# Patient Record
Sex: Female | Born: 1975 | Race: White | Hispanic: No | Marital: Single | Smoking: Current every day smoker
Health system: Southern US, Community
[De-identification: ages and names within clinical notes are randomized; demographics above are authoritative.]

## PROBLEM LIST (undated history)

## (undated) DIAGNOSIS — R4589 Other symptoms and signs involving emotional state: Secondary | ICD-10-CM

## (undated) DIAGNOSIS — K900 Celiac disease: Secondary | ICD-10-CM

## (undated) DIAGNOSIS — F419 Anxiety disorder, unspecified: Secondary | ICD-10-CM

## (undated) DIAGNOSIS — F4310 Post-traumatic stress disorder, unspecified: Secondary | ICD-10-CM

## (undated) DIAGNOSIS — F410 Panic disorder [episodic paroxysmal anxiety] without agoraphobia: Secondary | ICD-10-CM

## (undated) DIAGNOSIS — N3010 Interstitial cystitis (chronic) without hematuria: Secondary | ICD-10-CM

## (undated) DIAGNOSIS — IMO0002 Reserved for concepts with insufficient information to code with codable children: Secondary | ICD-10-CM

## (undated) DIAGNOSIS — I471 Supraventricular tachycardia, unspecified: Secondary | ICD-10-CM

## (undated) DIAGNOSIS — N200 Calculus of kidney: Secondary | ICD-10-CM

## (undated) DIAGNOSIS — G8929 Other chronic pain: Secondary | ICD-10-CM

## (undated) DIAGNOSIS — F32A Depression, unspecified: Secondary | ICD-10-CM

## (undated) DIAGNOSIS — M51369 Other intervertebral disc degeneration, lumbar region without mention of lumbar back pain or lower extremity pain: Secondary | ICD-10-CM

## (undated) DIAGNOSIS — I499 Cardiac arrhythmia, unspecified: Secondary | ICD-10-CM

## (undated) DIAGNOSIS — M5136 Other intervertebral disc degeneration, lumbar region: Secondary | ICD-10-CM

## (undated) HISTORY — PX: APPENDECTOMY: SHX54

## (undated) HISTORY — PX: HERNIA REPAIR: SHX51

## (undated) HISTORY — PX: ADENOIDECTOMY: SUR15

## (undated) HISTORY — PX: TONSILLECTOMY: SUR1361

## (undated) HISTORY — PX: ABDOMINAL HYSTERECTOMY: SHX81

## (undated) HISTORY — PX: KIDNEY STONE SURGERY: SHX686

---

## 2000-06-16 ENCOUNTER — Other Ambulatory Visit: Admission: RE | Admit: 2000-06-16 | Discharge: 2000-06-16 | Payer: Self-pay | Admitting: Otolaryngology

## 2000-11-24 ENCOUNTER — Other Ambulatory Visit: Admission: RE | Admit: 2000-11-24 | Discharge: 2000-11-24 | Payer: Self-pay | Admitting: Obstetrics and Gynecology

## 2001-04-30 ENCOUNTER — Ambulatory Visit (HOSPITAL_COMMUNITY): Admission: AD | Admit: 2001-04-30 | Discharge: 2001-04-30 | Payer: Self-pay | Admitting: Obstetrics and Gynecology

## 2001-06-21 ENCOUNTER — Inpatient Hospital Stay (HOSPITAL_COMMUNITY): Admission: RE | Admit: 2001-06-21 | Discharge: 2001-06-25 | Payer: Self-pay | Admitting: Obstetrics and Gynecology

## 2001-07-12 ENCOUNTER — Ambulatory Visit (HOSPITAL_COMMUNITY): Admission: RE | Admit: 2001-07-12 | Discharge: 2001-07-12 | Payer: Self-pay | Admitting: General Surgery

## 2002-02-28 ENCOUNTER — Emergency Department (HOSPITAL_COMMUNITY): Admission: EM | Admit: 2002-02-28 | Discharge: 2002-02-28 | Payer: Self-pay | Admitting: Emergency Medicine

## 2002-02-28 ENCOUNTER — Encounter: Payer: Self-pay | Admitting: Emergency Medicine

## 2002-04-08 ENCOUNTER — Emergency Department (HOSPITAL_COMMUNITY): Admission: EM | Admit: 2002-04-08 | Discharge: 2002-04-08 | Payer: Self-pay | Admitting: Emergency Medicine

## 2002-04-08 ENCOUNTER — Encounter: Payer: Self-pay | Admitting: Emergency Medicine

## 2002-10-11 ENCOUNTER — Encounter: Payer: Self-pay | Admitting: Emergency Medicine

## 2002-10-11 ENCOUNTER — Emergency Department (HOSPITAL_COMMUNITY): Admission: EM | Admit: 2002-10-11 | Discharge: 2002-10-11 | Payer: Self-pay | Admitting: Emergency Medicine

## 2002-10-13 ENCOUNTER — Ambulatory Visit (HOSPITAL_COMMUNITY): Admission: RE | Admit: 2002-10-13 | Discharge: 2002-10-13 | Payer: Self-pay | Admitting: Emergency Medicine

## 2002-10-13 ENCOUNTER — Encounter: Payer: Self-pay | Admitting: Emergency Medicine

## 2002-11-21 ENCOUNTER — Emergency Department (HOSPITAL_COMMUNITY): Admission: EM | Admit: 2002-11-21 | Discharge: 2002-11-21 | Payer: Self-pay | Admitting: Emergency Medicine

## 2002-12-08 ENCOUNTER — Encounter (HOSPITAL_COMMUNITY): Admission: RE | Admit: 2002-12-08 | Discharge: 2003-01-07 | Payer: Self-pay | Admitting: Preventative Medicine

## 2003-02-21 ENCOUNTER — Encounter: Payer: Self-pay | Admitting: *Deleted

## 2003-02-21 ENCOUNTER — Emergency Department (HOSPITAL_COMMUNITY): Admission: EM | Admit: 2003-02-21 | Discharge: 2003-02-21 | Payer: Self-pay | Admitting: *Deleted

## 2003-07-14 ENCOUNTER — Emergency Department (HOSPITAL_COMMUNITY): Admission: EM | Admit: 2003-07-14 | Discharge: 2003-07-14 | Payer: Self-pay | Admitting: Emergency Medicine

## 2003-08-25 ENCOUNTER — Ambulatory Visit (HOSPITAL_COMMUNITY): Admission: RE | Admit: 2003-08-25 | Discharge: 2003-08-25 | Payer: Self-pay | Admitting: Obstetrics and Gynecology

## 2003-12-03 ENCOUNTER — Emergency Department (HOSPITAL_COMMUNITY): Admission: EM | Admit: 2003-12-03 | Discharge: 2003-12-03 | Payer: Self-pay | Admitting: Emergency Medicine

## 2004-02-10 ENCOUNTER — Emergency Department (HOSPITAL_COMMUNITY): Admission: EM | Admit: 2004-02-10 | Discharge: 2004-02-10 | Payer: Self-pay | Admitting: Emergency Medicine

## 2004-04-04 ENCOUNTER — Ambulatory Visit (HOSPITAL_COMMUNITY): Admission: RE | Admit: 2004-04-04 | Discharge: 2004-04-04 | Payer: Self-pay | Admitting: Obstetrics & Gynecology

## 2004-08-08 ENCOUNTER — Inpatient Hospital Stay (HOSPITAL_COMMUNITY): Admission: RE | Admit: 2004-08-08 | Discharge: 2004-08-10 | Payer: Self-pay | Admitting: Obstetrics & Gynecology

## 2004-08-17 ENCOUNTER — Inpatient Hospital Stay (HOSPITAL_COMMUNITY): Admission: AD | Admit: 2004-08-17 | Discharge: 2004-08-20 | Payer: Self-pay | Admitting: Obstetrics & Gynecology

## 2004-08-27 ENCOUNTER — Ambulatory Visit (HOSPITAL_COMMUNITY): Admission: RE | Admit: 2004-08-27 | Discharge: 2004-08-27 | Payer: Self-pay | Admitting: Obstetrics & Gynecology

## 2004-08-30 ENCOUNTER — Ambulatory Visit (HOSPITAL_COMMUNITY): Admission: RE | Admit: 2004-08-30 | Discharge: 2004-08-30 | Payer: Self-pay | Admitting: Obstetrics & Gynecology

## 2005-03-21 ENCOUNTER — Ambulatory Visit (HOSPITAL_COMMUNITY): Admission: RE | Admit: 2005-03-21 | Discharge: 2005-03-21 | Payer: Self-pay | Admitting: General Surgery

## 2005-08-23 ENCOUNTER — Emergency Department (HOSPITAL_COMMUNITY): Admission: EM | Admit: 2005-08-23 | Discharge: 2005-08-23 | Payer: Self-pay | Admitting: Emergency Medicine

## 2005-09-01 ENCOUNTER — Observation Stay (HOSPITAL_COMMUNITY): Admission: RE | Admit: 2005-09-01 | Discharge: 2005-09-02 | Payer: Self-pay | Admitting: General Surgery

## 2005-09-30 ENCOUNTER — Emergency Department (HOSPITAL_COMMUNITY): Admission: EM | Admit: 2005-09-30 | Discharge: 2005-09-30 | Payer: Self-pay | Admitting: Emergency Medicine

## 2005-10-06 ENCOUNTER — Emergency Department (HOSPITAL_COMMUNITY): Admission: EM | Admit: 2005-10-06 | Discharge: 2005-10-06 | Payer: Self-pay | Admitting: Emergency Medicine

## 2006-07-23 ENCOUNTER — Ambulatory Visit (HOSPITAL_COMMUNITY): Admission: RE | Admit: 2006-07-23 | Discharge: 2006-07-23 | Payer: Self-pay | Admitting: Pulmonary Disease

## 2006-11-22 ENCOUNTER — Emergency Department (HOSPITAL_COMMUNITY): Admission: EM | Admit: 2006-11-22 | Discharge: 2006-11-22 | Payer: Self-pay | Admitting: Emergency Medicine

## 2007-02-03 ENCOUNTER — Other Ambulatory Visit: Admission: RE | Admit: 2007-02-03 | Discharge: 2007-02-03 | Payer: Self-pay | Admitting: Obstetrics and Gynecology

## 2007-04-14 ENCOUNTER — Emergency Department (HOSPITAL_COMMUNITY): Admission: EM | Admit: 2007-04-14 | Discharge: 2007-04-14 | Payer: Self-pay | Admitting: Emergency Medicine

## 2007-04-15 ENCOUNTER — Emergency Department (HOSPITAL_COMMUNITY): Admission: EM | Admit: 2007-04-15 | Discharge: 2007-04-16 | Payer: Self-pay | Admitting: Emergency Medicine

## 2007-04-19 ENCOUNTER — Inpatient Hospital Stay (HOSPITAL_COMMUNITY): Admission: EM | Admit: 2007-04-19 | Discharge: 2007-04-29 | Payer: Self-pay | Admitting: Emergency Medicine

## 2007-04-20 ENCOUNTER — Ambulatory Visit: Payer: Self-pay | Admitting: Gastroenterology

## 2007-04-21 ENCOUNTER — Ambulatory Visit: Payer: Self-pay | Admitting: Gastroenterology

## 2007-04-21 ENCOUNTER — Encounter: Payer: Self-pay | Admitting: Gastroenterology

## 2007-04-22 ENCOUNTER — Ambulatory Visit: Payer: Self-pay | Admitting: Gastroenterology

## 2007-04-23 ENCOUNTER — Ambulatory Visit: Payer: Self-pay | Admitting: Oncology

## 2007-04-23 ENCOUNTER — Ambulatory Visit: Payer: Self-pay | Admitting: Cardiology

## 2007-04-23 ENCOUNTER — Encounter: Payer: Self-pay | Admitting: Cardiology

## 2007-04-24 ENCOUNTER — Encounter: Payer: Self-pay | Admitting: Internal Medicine

## 2007-04-27 ENCOUNTER — Ambulatory Visit: Payer: Self-pay | Admitting: Internal Medicine

## 2007-04-29 ENCOUNTER — Ambulatory Visit: Payer: Self-pay | Admitting: Internal Medicine

## 2007-05-01 ENCOUNTER — Emergency Department (HOSPITAL_COMMUNITY): Admission: EM | Admit: 2007-05-01 | Discharge: 2007-05-02 | Payer: Self-pay | Admitting: Emergency Medicine

## 2007-05-05 ENCOUNTER — Inpatient Hospital Stay (HOSPITAL_COMMUNITY): Admission: EM | Admit: 2007-05-05 | Discharge: 2007-05-15 | Payer: Self-pay | Admitting: Emergency Medicine

## 2007-05-06 ENCOUNTER — Ambulatory Visit: Payer: Self-pay | Admitting: Internal Medicine

## 2007-05-08 ENCOUNTER — Ambulatory Visit: Payer: Self-pay | Admitting: Gastroenterology

## 2007-05-11 ENCOUNTER — Ambulatory Visit: Payer: Self-pay | Admitting: Internal Medicine

## 2007-05-31 ENCOUNTER — Ambulatory Visit: Payer: Self-pay | Admitting: Internal Medicine

## 2007-06-13 ENCOUNTER — Emergency Department (HOSPITAL_COMMUNITY): Admission: EM | Admit: 2007-06-13 | Discharge: 2007-06-14 | Payer: Self-pay | Admitting: Emergency Medicine

## 2007-07-20 ENCOUNTER — Ambulatory Visit: Payer: Self-pay | Admitting: Gastroenterology

## 2008-03-21 ENCOUNTER — Inpatient Hospital Stay (HOSPITAL_COMMUNITY): Admission: RE | Admit: 2008-03-21 | Discharge: 2008-03-23 | Payer: Self-pay | Admitting: Obstetrics and Gynecology

## 2008-03-21 ENCOUNTER — Encounter: Payer: Self-pay | Admitting: Obstetrics and Gynecology

## 2008-06-22 ENCOUNTER — Ambulatory Visit: Payer: Self-pay | Admitting: Internal Medicine

## 2008-06-29 ENCOUNTER — Encounter: Payer: Self-pay | Admitting: Internal Medicine

## 2008-06-29 ENCOUNTER — Ambulatory Visit: Admission: RE | Admit: 2008-06-29 | Discharge: 2008-06-29 | Payer: Self-pay | Admitting: Internal Medicine

## 2008-07-06 ENCOUNTER — Ambulatory Visit: Payer: Self-pay | Admitting: Pulmonary Disease

## 2008-10-26 ENCOUNTER — Emergency Department (HOSPITAL_COMMUNITY): Admission: EM | Admit: 2008-10-26 | Discharge: 2008-10-26 | Payer: Self-pay | Admitting: Emergency Medicine

## 2008-10-29 ENCOUNTER — Emergency Department (HOSPITAL_COMMUNITY): Admission: EM | Admit: 2008-10-29 | Discharge: 2008-10-29 | Payer: Self-pay | Admitting: Emergency Medicine

## 2008-10-30 ENCOUNTER — Encounter (INDEPENDENT_AMBULATORY_CARE_PROVIDER_SITE_OTHER): Payer: Self-pay | Admitting: *Deleted

## 2008-11-04 ENCOUNTER — Emergency Department (HOSPITAL_COMMUNITY): Admission: EM | Admit: 2008-11-04 | Discharge: 2008-11-05 | Payer: Self-pay | Admitting: Emergency Medicine

## 2008-11-07 ENCOUNTER — Emergency Department (HOSPITAL_COMMUNITY): Admission: EM | Admit: 2008-11-07 | Discharge: 2008-11-07 | Payer: Self-pay | Admitting: Emergency Medicine

## 2008-12-05 DIAGNOSIS — K900 Celiac disease: Secondary | ICD-10-CM

## 2008-12-05 DIAGNOSIS — R109 Unspecified abdominal pain: Secondary | ICD-10-CM | POA: Insufficient documentation

## 2008-12-05 DIAGNOSIS — F411 Generalized anxiety disorder: Secondary | ICD-10-CM | POA: Insufficient documentation

## 2008-12-05 DIAGNOSIS — N83209 Unspecified ovarian cyst, unspecified side: Secondary | ICD-10-CM

## 2008-12-05 DIAGNOSIS — M545 Low back pain: Secondary | ICD-10-CM

## 2008-12-05 DIAGNOSIS — Z8679 Personal history of other diseases of the circulatory system: Secondary | ICD-10-CM

## 2009-01-12 ENCOUNTER — Ambulatory Visit (HOSPITAL_COMMUNITY): Admission: RE | Admit: 2009-01-12 | Discharge: 2009-01-12 | Payer: Self-pay | Admitting: Pulmonary Disease

## 2009-02-09 IMAGING — CR DG ABDOMEN 2V
3 series · 3 of 3 positions shown · non-contrast
Comparison: 04/20/2007

CLINICAL DATA: No bowel movement for 10 days.

ABDOMEN SERIES - 2 VIEW

[w abdomen upright (1 of 2)]
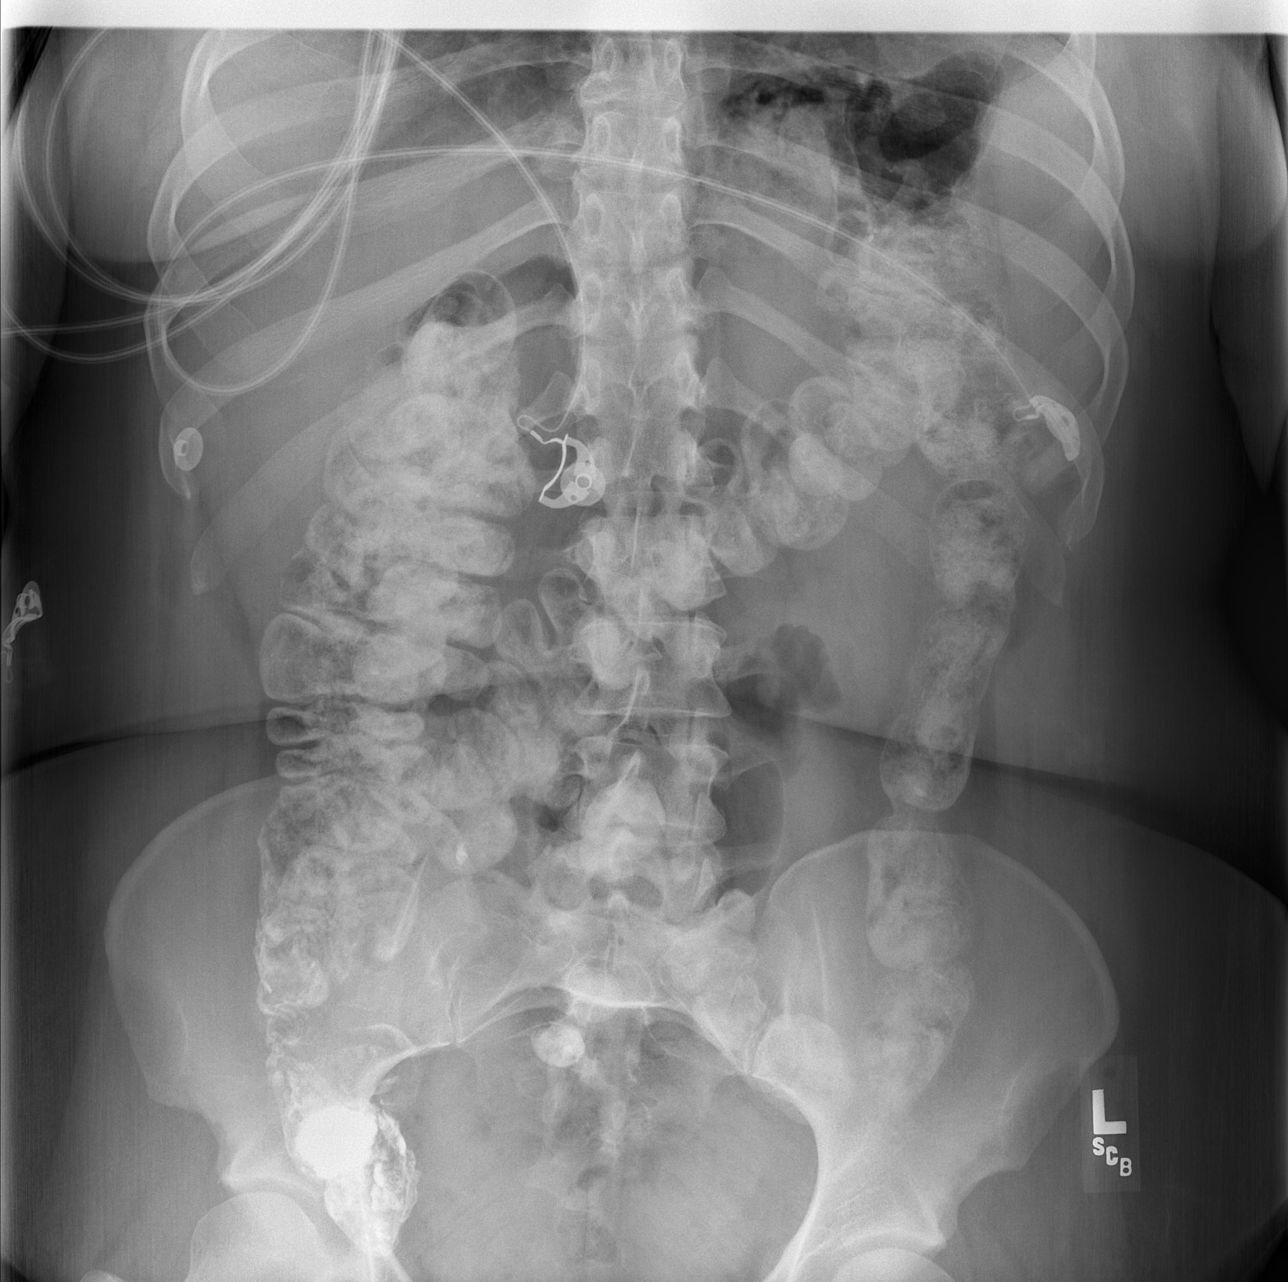

[w abdomen upright (2 of 2)]
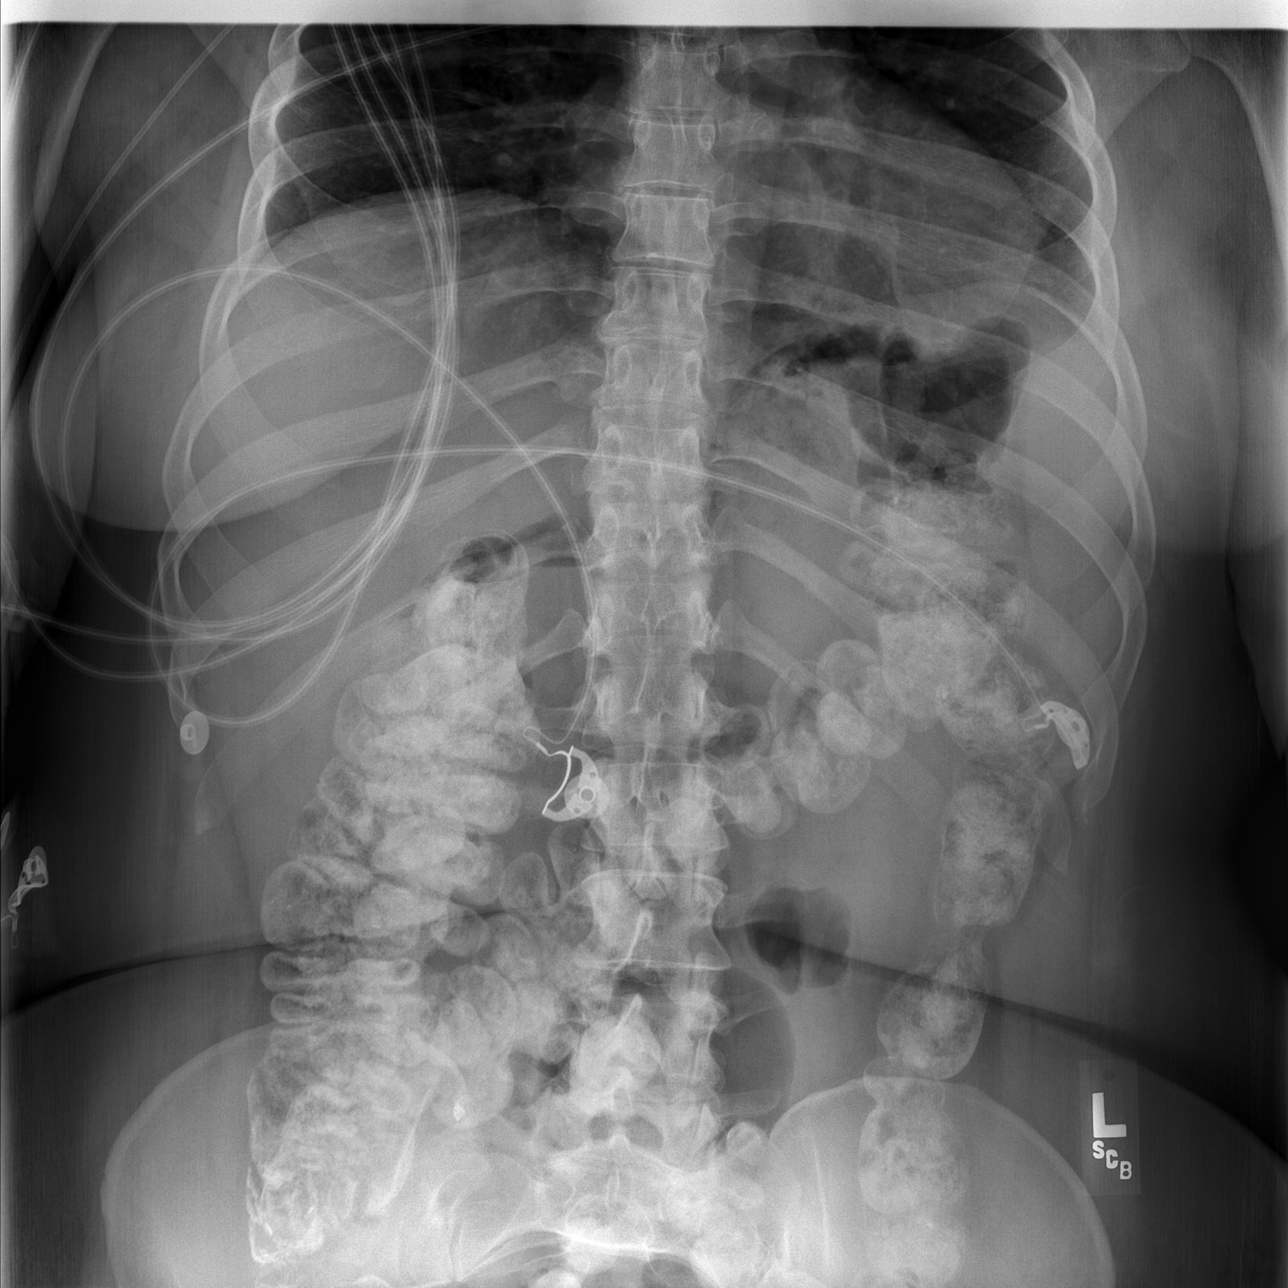

[t abdomen supine]
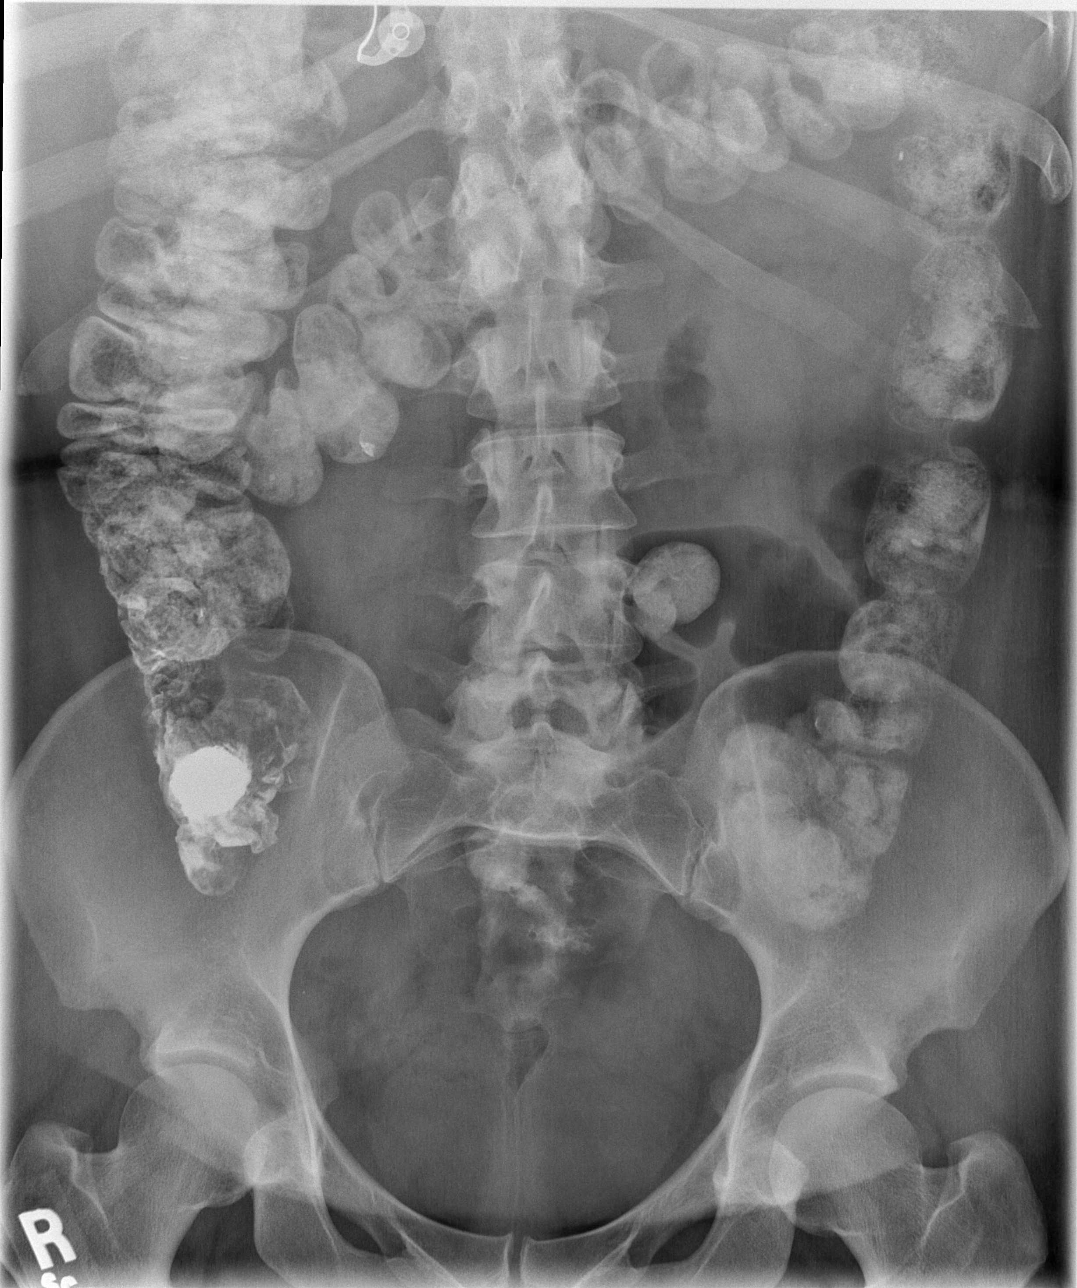

[3 of 3 positions shown; findings below may reference images not displayed]

FINDINGS: Partially flocculated barium is present in the colon. No dilated
bowel is evident. The colon demonstrates normal haustral pattern. No free
intraperitoneal gas is evident beneath the hemidiaphragms. No significant
abnormal air-fluid levels noted. The stomach does not appear distended. A
localized dense barium collection is noted in the cecum.

IMPRESSION

1. The pattern of barium in the colon is unremarkable. We do not demonstrate
prominence of stool in the colon, nor do we demonstrate dilated small bowel or
significant abnormal air-fluid levels.

## 2009-03-27 ENCOUNTER — Encounter: Payer: Self-pay | Admitting: Obstetrics and Gynecology

## 2009-03-27 ENCOUNTER — Ambulatory Visit (HOSPITAL_COMMUNITY): Admission: RE | Admit: 2009-03-27 | Discharge: 2009-03-27 | Payer: Self-pay | Admitting: Obstetrics and Gynecology

## 2009-07-09 ENCOUNTER — Encounter (INDEPENDENT_AMBULATORY_CARE_PROVIDER_SITE_OTHER): Payer: Self-pay | Admitting: *Deleted

## 2010-01-20 ENCOUNTER — Emergency Department (HOSPITAL_COMMUNITY): Admission: EM | Admit: 2010-01-20 | Discharge: 2010-01-20 | Payer: Self-pay | Admitting: Emergency Medicine

## 2010-04-27 ENCOUNTER — Emergency Department (HOSPITAL_COMMUNITY)
Admission: EM | Admit: 2010-04-27 | Discharge: 2010-04-27 | Payer: Self-pay | Source: Home / Self Care | Admitting: Emergency Medicine

## 2010-04-30 ENCOUNTER — Emergency Department (HOSPITAL_COMMUNITY)
Admission: EM | Admit: 2010-04-30 | Discharge: 2010-05-01 | Payer: Self-pay | Source: Home / Self Care | Admitting: Emergency Medicine

## 2010-05-07 ENCOUNTER — Inpatient Hospital Stay (HOSPITAL_COMMUNITY)
Admission: AD | Admit: 2010-05-07 | Discharge: 2010-05-24 | Payer: Self-pay | Source: Home / Self Care | Attending: Pulmonary Disease | Admitting: Pulmonary Disease

## 2010-05-13 ENCOUNTER — Encounter: Payer: Self-pay | Admitting: Internal Medicine

## 2010-05-26 ENCOUNTER — Emergency Department (HOSPITAL_COMMUNITY)
Admission: EM | Admit: 2010-05-26 | Discharge: 2010-05-26 | Payer: Self-pay | Source: Home / Self Care | Admitting: Emergency Medicine

## 2010-05-28 ENCOUNTER — Encounter: Payer: Self-pay | Admitting: Internal Medicine

## 2010-06-07 ENCOUNTER — Encounter: Payer: Self-pay | Admitting: Internal Medicine

## 2010-06-10 ENCOUNTER — Encounter (INDEPENDENT_AMBULATORY_CARE_PROVIDER_SITE_OTHER): Payer: Self-pay | Admitting: *Deleted

## 2010-06-17 ENCOUNTER — Ambulatory Visit: Admit: 2010-06-17 | Payer: Self-pay | Admitting: Gastroenterology

## 2010-06-25 NOTE — Letter (Signed)
Summary: Appointment Reminder  [facility]  [address]   [place], [place] [address]   Phone: [phone]  Fax: [phone]       July 09, 2009   Samantha [name] [address] [place], [place]  [address] 1975-10-07    Dear [name],  We have been unable to reach you by phone to schedule a follow up   appointment that was recommended for you by Dr. Darrick Penna. It is very   important that we reach you to schedule an appointment. We hope that you  allow [place] to participate in your health care needs. Please contact us at  [phone] at your earliest convenience to schedule your appointment.  Sincerely,    [facility] R. Roetta Sessions, M.D.    Kassie Mends, M.D. Lorenza Burton, FNP-BC    Tana Coast, PA-C Phone: [phone]    Fax: [phone]

## 2010-06-27 NOTE — Letter (Signed)
Summary: RAD REPORT NM HEPATO W/EJECT FRACT  RAD REPORT NM HEPATO W/EJECT FRACT   Imported By: Rexene Alberts 05/28/2010 12:20:05  _____________________________________________________________________  External Attachment:    Type:   Image     Comment:   External Document

## 2010-06-27 NOTE — Miscellaneous (Signed)
Summary: CONSULTATION  Clinical Lists Changes  NAME:  Samantha [name], Samantha [name]                ACCOUNT NO.:  [id number]      MEDICAL RECORD NO.:  [id number]          PATIENT TYPE:  INP      LOCATION:  A315                          FACILITY:  [facility]      PHYSICIAN:  Jonette Eva, M.D.     DATE OF BIRTH:  Sep 18, 1975      DATE OF CONSULTATION:   DATE OF DISCHARGE:                                    CONSULTATION         REFERRING PHYSICIAN:  Edward L. Juanetta Gosling, M.D.      GASTROENTEROLOGIST:  Dr. Jonette Eva.      REASON FOR REFERRAL:  Abdominal pain and possible pancreatitis.      HISTORY OF PRESENT ILLNESS:  Samantha [name] is a pleasant 35 year old   Caucasian female who presents with a 1-week history of a diffuse   abdominal pain that is described as sharp, constant, and is not   associated with any factors such as eating, drinking or movement.  She   does complain of nausea and vomiting.  She states she has more puke   than poop.  She does have a history of chronic constipation, going   sometimes 2 weeks between bowel movements.  It is of note, she was   diagnosed with celiac disease in 2008.  This as done by both lab and   duodenal biopsies with confirmation.  Her last bowel movement was   yesterday, prior that it was 2 weeks ago.  She denies any melena or   hematochezia.  She does have a history of chronic abdominal pain   requiring multiple admissions to the hospital.  She has actually had a   diagnostic laparoscopy because of this abdominal pain.  She does have   just a mildly elevated lipase at 85, and she recently underwent a CT   scan this morning but that is not currently back yet.      PAST MEDICAL HISTORY:   1. Celiac disease diagnosed in 2008 through biopsy.   2. Chronic abdominal pain.   3. Anxiety.   4. History of SVT.   5. Questionable reflux.   6. Chronic back pain.      PAST SURGICAL HISTORY:  Diagnostic laparoscopy in 2008 by Dr. Lovell Sheehan   with lysis of adhesions.  Also a  laparoscopic right ovarian removal in   November 2010 well as lysis of omental and small-bowel adhesions.  She   has had a C-section x2, a hysterectomy.  She reports a hernia repair as   well as left salpingo-oophorectomy and laparotomy, finally an   appendectomy.      MEDICATIONS PRIOR TO ADMISSION:  Celexa, Estrace, verapamil, Valium 5 mg   that she takes as needed for anxiety.  She reports not taking this   around the clock/  also Norco 10/325, this is for her chronic back pain.      SOCIAL HISTORY:  She is divorced and single.  She does have 2 children   live with her.  FAMILY HISTORY:  Her mom has a history of thyroid disease as well as   alcohol abuse.  She does not know about her father.  Her brother has   arthritis.  There is no family history of colon cancer.      She has allergies to nothing, no known drug allergies.      REVIEW OF SYSTEMS:  Negative in detail except as mentioned in the HPI.      PHYSICAL EXAMINATION:  VITAL SIGNS:  Her blood pressure is 111/74, pulse   76, respirations 20, temperature 98.3.   GENERAL:  She is in no apparent distress.  She has quite a flat affect.   She seems somewhat anxious and appears to be uncomfortable.   HEENT:  Neck is without mass.  There is no thyromegaly noted.  No   lymphadenopathy.  Sclerae without any icterus.  Head is normocephalic   and atraumatic.   CARDIAC:  Regular rate and rhythm.  S1 and S2 noted.   LUNGS:  Clear to auscultation bilaterally without wheezes, rales or   rhonchi.   ABDOMEN:  Soft, obese, tender to palpation on right side of abdomen as   well as left side of abdomen.  There is no rebound or guarding noted.   EXTREMITIES:  Without edema.   NEUROLOGIC:  She is alert and oriented.      PERTINENT LABS FOR THIS ADMISSION:  December 13 H and H is 11.5 and   31.6, white blood cells are normal at 4.1.  today a BMP shows a   potassium of 4.4, sodium 142, BUN is 17, creatinine 0.82.  December 13   LFTs  showed total bilirubin of 0.2 slightly low, alkaline phosphatase   38, AST and ALT are normal at 15 and 18, respectively.  Lipase is mildly   elevated at 85.      RADIOLOGY:  CT is pending results.  The patient has already completed   this at the time of the history of physical.      ASSESSMENT AND PLAN:  Samantha [name] is a 35 year old female with quite a   significant history of multiple admissions secondary to abdominal pain.   She does have a component of chronic constipation that most likely   aggravates this chronic abdominal pain.  Although her lipase is mildly   elevated, it is doubtful that she has pancreatitis as this is not   significantly elevated.  Differential diagnosis includes functional   abdominal pain.      PLAN:   1. Continue supportive measures such as pain control, IV fluids, clear       liquids.   2. We will review CT when it is performed but it is our guess that       there will likely be no findings to explain her abdominal pain.   3. The patient may benefit from a bowel regimen.  She did have a bowel       movement yesterday.  However, she might be of benefit of Amitiza       but she states as has tried MiraLax in the past and this did not       help.      We will continue to follow and assist as needed.  We would like to thank   Dr. Juanetta Gosling for the referral of this nice lady.            ______________________________   Gerrit Halls, ANP-BC  ______________________________   Jonette Eva, M.D.            AS/MEDQ  D:  05/08/2010  T:  05/08/2010  Job:  [id number]      cc:   Ramon Dredge L. Juanetta Gosling, M.D.   Fax: [phone]      Electronically Signed by Gerrit Halls  on 05/16/2010 04:00:06 PM   Electronically Signed by Jonette Eva M.D. on 06/06/2010 08:34:06 PM

## 2010-06-27 NOTE — Letter (Signed)
Summary: DISCHARGE SUMMARY FROM [facility]  DISCHARGE SUMMARY FROM [facility]   Imported By: Rexene Alberts 06/07/2010 10:31:01  _____________________________________________________________________  External Attachment:    Type:   Image     Comment:   External Document

## 2010-06-27 NOTE — Letter (Signed)
Summary: RAD REPORT [place] ABD COMP  RAD REPORT [place] ABD COMP   Imported By: Rexene Alberts 05/13/2010 09:47:13  _____________________________________________________________________  External Attachment:    Type:   Image     Comment:   External Document

## 2010-08-02 ENCOUNTER — Emergency Department (HOSPITAL_COMMUNITY)
Admission: EM | Admit: 2010-08-02 | Discharge: 2010-08-02 | Disposition: A | Discharge status: Home / Self Care | Payer: Medicaid Other | Attending: Emergency Medicine | Admitting: Emergency Medicine

## 2010-08-02 ENCOUNTER — Emergency Department (HOSPITAL_COMMUNITY): Payer: Medicaid Other

## 2010-08-02 DIAGNOSIS — M545 Low back pain, unspecified: Secondary | ICD-10-CM | POA: Insufficient documentation

## 2010-08-02 DIAGNOSIS — R319 Hematuria, unspecified: Secondary | ICD-10-CM | POA: Insufficient documentation

## 2010-08-02 LAB — BASIC METABOLIC PANEL
BUN: 16 mg/dL (ref 6–23)
Calcium: 8.3 mg/dL — ABNORMAL LOW (ref 8.4–10.5)
GFR calc Af Amer: >60 mL/min (ref >60)
Sodium: 138 mEq/L (ref 135–145)

## 2010-08-02 LAB — URINE MICROSCOPIC-ADD ON

## 2010-08-02 LAB — URINALYSIS, ROUTINE W REFLEX MICROSCOPIC
Ketones, ur: NEGATIVE mg/dL
Leukocytes, UA: NEGATIVE
Protein, ur: NEGATIVE mg/dL
Specific Gravity, Urine: >1.03 — ABNORMAL HIGH (ref 1.005–1.030)

## 2010-08-05 LAB — CBC
HCT: 36.5 % (ref 36.0–46.0)
HCT: 32.7 % — ABNORMAL LOW (ref 36.0–46.0)
HCT: 34.4 % — ABNORMAL LOW (ref 36.0–46.0)
Hemoglobin: 10.7 g/dL — ABNORMAL LOW (ref 12.0–15.0)
Hemoglobin: 13.2 g/dL (ref 12.0–15.0)
Hemoglobin: 11.8 g/dL — ABNORMAL LOW (ref 12.0–15.0)
Hemoglobin: 12.5 g/dL (ref 12.0–15.0)
MCH: 29.5 pg (ref 26.0–34.0)
MCH: 29.5 pg (ref 26.0–34.0)
MCH: 29.9 pg (ref 26.0–34.0)
MCH: 30.2 pg (ref 26.0–34.0)
MCHC: 35.9 g/dL (ref 30.0–36.0)
MCHC: 36.2 g/dL — ABNORMAL HIGH (ref 30.0–36.0)
MCHC: 36.1 g/dL — ABNORMAL HIGH (ref 30.0–36.0)
MCHC: 36.3 g/dL — ABNORMAL HIGH (ref 30.0–36.0)
MCHC: 36.4 g/dL — ABNORMAL HIGH (ref 30.0–36.0)
MCV: 81.5 fL (ref 78.0–100.0)
MCV: 82.8 fL (ref 78.0–100.0)
MCV: 81.8 fL (ref 78.0–100.0)
MCV: 82.3 fL (ref 78.0–100.0)
MCV: 83.1 fL (ref 78.0–100.0)
Platelets: 154 10*3/uL (ref 150–400)
Platelets: 204 K/uL (ref 150–400)
Platelets: 160 10*3/uL (ref 150–400)
Platelets: 186 K/uL (ref 150–400)
Platelets: 187 K/uL (ref 150–400)
RBC: 4.48 MIL/uL (ref 3.87–5.11)
RBC: 4 MIL/uL (ref 3.87–5.11)
RBC: 4.14 MIL/uL (ref 3.87–5.11)
RDW: 13.8 % (ref 11.5–15.5)
RDW: 12.7 % (ref 11.5–15.5)
RDW: 12.9 % (ref 11.5–15.5)
RDW: 12.9 % (ref 11.5–15.5)
WBC: 3.5 10*3/uL — ABNORMAL LOW (ref 4.0–10.5)
WBC: 5.8 K/uL (ref 4.0–10.5)
WBC: 4.5 K/uL (ref 4.0–10.5)
WBC: 6.5 K/uL (ref 4.0–10.5)

## 2010-08-05 LAB — URINALYSIS, ROUTINE W REFLEX MICROSCOPIC
Glucose, UA: NEGATIVE mg/dL
Glucose, UA: NEGATIVE mg/dL
Glucose, UA: NEGATIVE mg/dL
Ketones, ur: NEGATIVE mg/dL
Ketones, ur: NEGATIVE mg/dL
Leukocytes, UA: NEGATIVE
Leukocytes, UA: NEGATIVE
Leukocytes, UA: NEGATIVE
Nitrite: NEGATIVE
Nitrite: NEGATIVE
Nitrite: POSITIVE — AB
Protein, ur: 30 mg/dL — AB
Protein, ur: NEGATIVE mg/dL
Specific Gravity, Urine: 1.02 (ref 1.005–1.030)
Specific Gravity, Urine: >1.03 — ABNORMAL HIGH (ref 1.005–1.030)
Urobilinogen, UA: 0.2 mg/dL (ref 0.0–1.0)
Urobilinogen, UA: 0.2 mg/dL (ref 0.0–1.0)
pH: 6 (ref 5.0–8.0)
pH: 5 (ref 5.0–8.0)

## 2010-08-05 LAB — URINE CULTURE
Colony Count: 5000
Colony Count: NO GROWTH
Culture  Setup Time: 201112201115
Culture: NO GROWTH

## 2010-08-05 LAB — BASIC METABOLIC PANEL
BUN: 17 mg/dL (ref 6–23)
CO2: 27 mEq/L (ref 19–32)
Calcium: 8.1 mg/dL — ABNORMAL LOW (ref 8.4–10.5)
Calcium: 8.9 mg/dL (ref 8.4–10.5)
Chloride: 107 mEq/L (ref 96–112)
Chloride: 111 mEq/L (ref 96–112)
Creatinine, Ser: 0.82 mg/dL (ref 0.4–1.2)
GFR calc Af Amer: >60 mL/min (ref >60)
GFR calc Af Amer: >60 mL/min (ref >60)
GFR calc Af Amer: >60 mL/min (ref >60)
GFR calc non Af Amer: >60 mL/min (ref >60)
Glucose, Bld: 86 mg/dL (ref 70–99)
Glucose, Bld: 97 mg/dL (ref 70–99)
Potassium: 4.4 mEq/L (ref 3.5–5.1)
Sodium: 139 mEq/L (ref 135–145)

## 2010-08-05 LAB — DIFFERENTIAL
Basophils Absolute: 0 10*3/uL (ref 0.0–0.1)
Basophils Absolute: 0 K/uL (ref 0.0–0.1)
Basophils Absolute: 0 K/uL (ref 0.0–0.1)
Basophils Absolute: 0 K/uL (ref 0.0–0.1)
Basophils Relative: 0 % (ref 0–1)
Basophils Relative: 0 % (ref 0–1)
Basophils Relative: 0 % (ref 0–1)
Basophils Relative: 0 % (ref 0–1)
Basophils Relative: 0 % (ref 0–1)
Eosinophils Absolute: 0 10*3/uL (ref 0.0–0.7)
Eosinophils Absolute: 0 K/uL (ref 0.0–0.7)
Eosinophils Absolute: 0 K/uL (ref 0.0–0.7)
Eosinophils Absolute: 0 K/uL (ref 0.0–0.7)
Eosinophils Relative: 1 % (ref 0–5)
Eosinophils Relative: 0 % (ref 0–5)
Eosinophils Relative: 1 % (ref 0–5)
Lymphocytes Relative: 48 % — ABNORMAL HIGH (ref 12–46)
Lymphocytes Relative: 33 % (ref 12–46)
Lymphocytes Relative: 49 % — ABNORMAL HIGH (ref 12–46)
Lymphs Abs: 1.7 K/uL (ref 0.7–4.0)
Lymphs Abs: 2 K/uL (ref 0.7–4.0)
Lymphs Abs: 2.2 K/uL (ref 0.7–4.0)
Monocytes Absolute: 0.3 K/uL (ref 0.1–1.0)
Monocytes Absolute: 0.3 K/uL (ref 0.1–1.0)
Monocytes Absolute: 0.5 K/uL (ref 0.1–1.0)
Monocytes Relative: 10 % (ref 3–12)
Monocytes Relative: 9 % (ref 3–12)
Monocytes Relative: 10 % (ref 3–12)
Monocytes Relative: 8 % (ref 3–12)
Monocytes Relative: 8 % (ref 3–12)
Neutro Abs: 1.5 K/uL — ABNORMAL LOW (ref 1.7–7.7)
Neutro Abs: 1.8 K/uL (ref 1.7–7.7)
Neutro Abs: 2.5 10*3/uL (ref 1.7–7.7)
Neutro Abs: 3.8 K/uL (ref 1.7–7.7)
Neutrophils Relative %: 41 % — ABNORMAL LOW (ref 43–77)
Neutrophils Relative %: 43 % (ref 43–77)
Neutrophils Relative %: 43 % (ref 43–77)
Neutrophils Relative %: 55 % (ref 43–77)
Neutrophils Relative %: 59 % (ref 43–77)

## 2010-08-05 LAB — URINE MICROSCOPIC-ADD ON

## 2010-08-05 LAB — COMPREHENSIVE METABOLIC PANEL WITH GFR
ALT: 18 U/L (ref 0–35)
AST: 15 U/L (ref 0–37)
Albumin: 3.7 g/dL (ref 3.5–5.2)
Alkaline Phosphatase: 38 U/L — ABNORMAL LOW (ref 39–117)
BUN: 27 mg/dL — ABNORMAL HIGH (ref 6–23)
CO2: 27 meq/L (ref 19–32)
Calcium: 8.3 mg/dL — ABNORMAL LOW (ref 8.4–10.5)
Chloride: 108 meq/L (ref 96–112)
Creatinine, Ser: 1.03 mg/dL (ref 0.4–1.2)
GFR calc non Af Amer: >60 mL/min
Glucose, Bld: 104 mg/dL — ABNORMAL HIGH (ref 70–99)
Potassium: 4.5 meq/L (ref 3.5–5.1)
Sodium: 140 meq/L (ref 135–145)
Total Bilirubin: 0.2 mg/dL — ABNORMAL LOW (ref 0.3–1.2)
Total Protein: 6.3 g/dL (ref 6.0–8.3)

## 2010-08-05 LAB — BASIC METABOLIC PANEL WITH GFR
BUN: 6 mg/dL (ref 6–23)
BUN: 7 mg/dL (ref 6–23)
CO2: 25 meq/L (ref 19–32)
CO2: 28 meq/L (ref 19–32)
Calcium: 7.9 mg/dL — ABNORMAL LOW (ref 8.4–10.5)
Calcium: 8.8 mg/dL (ref 8.4–10.5)
Chloride: 108 meq/L (ref 96–112)
Chloride: 111 meq/L (ref 96–112)
Creatinine, Ser: 0.72 mg/dL (ref 0.4–1.2)
Creatinine, Ser: 0.76 mg/dL (ref 0.4–1.2)
GFR calc non Af Amer: >60 mL/min
GFR calc non Af Amer: >60 mL/min
Glucose, Bld: 104 mg/dL — ABNORMAL HIGH (ref 70–99)
Glucose, Bld: 89 mg/dL (ref 70–99)
Potassium: 3.5 meq/L (ref 3.5–5.1)
Potassium: 4 meq/L (ref 3.5–5.1)
Sodium: 141 meq/L (ref 135–145)
Sodium: 143 meq/L (ref 135–145)

## 2010-08-05 LAB — COMPREHENSIVE METABOLIC PANEL
ALT: 50 U/L — ABNORMAL HIGH (ref 0–35)
Alkaline Phosphatase: 39 U/L (ref 39–117)
CO2: 31 mEq/L (ref 19–32)
Chloride: 103 mEq/L (ref 96–112)
GFR calc non Af Amer: >60 mL/min (ref >60)
Glucose, Bld: 104 mg/dL — ABNORMAL HIGH (ref 70–99)
Potassium: 3 mEq/L — ABNORMAL LOW (ref 3.5–5.1)
Sodium: 140 mEq/L (ref 135–145)
Total Bilirubin: 0.6 mg/dL (ref 0.3–1.2)
Total Protein: 6.7 g/dL (ref 6.0–8.3)

## 2010-08-05 LAB — LIPASE, BLOOD
Lipase: 1027 U/L — ABNORMAL HIGH (ref 11–59)
Lipase: 541 U/L — ABNORMAL HIGH (ref 11–59)

## 2010-08-05 LAB — HEPATIC FUNCTION PANEL
ALT: 69 U/L — ABNORMAL HIGH (ref 0–35)
AST: 24 U/L (ref 0–37)
Albumin: 3.6 g/dL (ref 3.5–5.2)
Alkaline Phosphatase: 36 U/L — ABNORMAL LOW (ref 39–117)
Bilirubin, Direct: 0.2 mg/dL (ref 0.0–0.3)
Indirect Bilirubin: 0.4 mg/dL (ref 0.3–0.9)
Total Bilirubin: 0.6 mg/dL (ref 0.3–1.2)
Total Protein: 5.9 g/dL — ABNORMAL LOW (ref 6.0–8.3)

## 2010-08-05 LAB — TISSUE TRANSGLUTAMINASE, IGA: Tissue Transglutaminase Ab, IgA: 84.6 U/mL — ABNORMAL HIGH (ref <20)

## 2010-08-05 LAB — POCT PREGNANCY, URINE: Preg Test, Ur: NEGATIVE

## 2010-08-08 LAB — URINE MICROSCOPIC-ADD ON

## 2010-08-08 LAB — CBC
HCT: 36.2 % (ref 36.0–46.0)
Hemoglobin: 12.5 g/dL (ref 12.0–15.0)
MCH: 30.1 pg (ref 26.0–34.0)
MCHC: 34.7 g/dL (ref 30.0–36.0)
MCV: 86.9 fL (ref 78.0–100.0)
Platelets: 193 K/uL (ref 150–400)
RBC: 4.16 MIL/uL (ref 3.87–5.11)
RDW: 14 % (ref 11.5–15.5)
WBC: 5.2 K/uL (ref 4.0–10.5)

## 2010-08-08 LAB — BASIC METABOLIC PANEL
BUN: 10 mg/dL (ref 6–23)
Chloride: 104 mEq/L (ref 96–112)
GFR calc Af Amer: >60 mL/min (ref >60)
GFR calc non Af Amer: >60 mL/min (ref >60)
Potassium: 3.5 mEq/L (ref 3.5–5.1)
Sodium: 135 mEq/L (ref 135–145)

## 2010-08-08 LAB — DIFFERENTIAL
Basophils Absolute: 0 K/uL (ref 0.0–0.1)
Basophils Relative: 0 % (ref 0–1)
Eosinophils Absolute: 0 K/uL (ref 0.0–0.7)
Eosinophils Relative: 0 % (ref 0–5)
Lymphocytes Relative: 25 % (ref 12–46)
Lymphs Abs: 1.3 K/uL (ref 0.7–4.0)
Monocytes Absolute: 0.4 K/uL (ref 0.1–1.0)
Monocytes Relative: 7 % (ref 3–12)
Neutro Abs: 3.5 K/uL (ref 1.7–7.7)
Neutrophils Relative %: 68 % (ref 43–77)

## 2010-08-08 LAB — URINALYSIS, ROUTINE W REFLEX MICROSCOPIC
Glucose, UA: NEGATIVE mg/dL
Leukocytes, UA: NEGATIVE
Nitrite: NEGATIVE
Specific Gravity, Urine: >1.03 — ABNORMAL HIGH (ref 1.005–1.030)
Urobilinogen, UA: 0.2 mg/dL (ref 0.0–1.0)
pH: 5.5 (ref 5.0–8.0)

## 2010-08-08 LAB — URINE CULTURE
Colony Count: NO GROWTH
Culture  Setup Time: 201108282124
Culture: NO GROWTH

## 2010-08-11 ENCOUNTER — Emergency Department (HOSPITAL_COMMUNITY): Payer: Medicaid Other

## 2010-08-11 ENCOUNTER — Emergency Department (HOSPITAL_COMMUNITY)
Admission: EM | Admit: 2010-08-11 | Discharge: 2010-08-11 | Disposition: A | Discharge status: Home / Self Care | Payer: Medicaid Other | Attending: Emergency Medicine | Admitting: Emergency Medicine

## 2010-08-11 DIAGNOSIS — F3289 Other specified depressive episodes: Secondary | ICD-10-CM | POA: Insufficient documentation

## 2010-08-11 DIAGNOSIS — F329 Major depressive disorder, single episode, unspecified: Secondary | ICD-10-CM | POA: Insufficient documentation

## 2010-08-11 DIAGNOSIS — K900 Celiac disease: Secondary | ICD-10-CM | POA: Insufficient documentation

## 2010-08-11 DIAGNOSIS — Z79899 Other long term (current) drug therapy: Secondary | ICD-10-CM | POA: Insufficient documentation

## 2010-08-11 DIAGNOSIS — R109 Unspecified abdominal pain: Secondary | ICD-10-CM | POA: Insufficient documentation

## 2010-08-11 DIAGNOSIS — R319 Hematuria, unspecified: Secondary | ICD-10-CM | POA: Insufficient documentation

## 2010-08-11 LAB — DIFFERENTIAL
Lymphs Abs: 1.8 10*3/uL (ref 0.7–4.0)
Monocytes Relative: 8 % (ref 3–12)
Neutro Abs: 3 10*3/uL (ref 1.7–7.7)
Neutrophils Relative %: 58 % (ref 43–77)

## 2010-08-11 LAB — COMPREHENSIVE METABOLIC PANEL
AST: 14 U/L (ref 0–37)
BUN: 13 mg/dL (ref 6–23)
CO2: 24 mEq/L (ref 19–32)
Chloride: 104 mEq/L (ref 96–112)
Creatinine, Ser: 0.67 mg/dL (ref 0.4–1.2)
GFR calc non Af Amer: >60 mL/min (ref >60)
Glucose, Bld: 87 mg/dL (ref 70–99)
Total Bilirubin: 0.6 mg/dL (ref 0.3–1.2)

## 2010-08-11 LAB — CBC
HCT: 36.1 % (ref 36.0–46.0)
Hemoglobin: 12.7 g/dL (ref 12.0–15.0)
MCH: 29.9 pg (ref 26.0–34.0)
MCV: 84.9 fL (ref 78.0–100.0)
RBC: 4.25 MIL/uL (ref 3.87–5.11)

## 2010-08-28 LAB — COMPREHENSIVE METABOLIC PANEL
ALT: 23 U/L (ref 0–35)
Alkaline Phosphatase: 36 U/L — ABNORMAL LOW (ref 39–117)
Glucose, Bld: 104 mg/dL — ABNORMAL HIGH (ref 70–99)
Potassium: 3.9 mEq/L (ref 3.5–5.1)
Sodium: 138 mEq/L (ref 135–145)
Total Protein: 6.6 g/dL (ref 6.0–8.3)

## 2010-08-28 LAB — CBC
Hemoglobin: 11.8 g/dL — ABNORMAL LOW (ref 12.0–15.0)
RBC: 3.92 MIL/uL (ref 3.87–5.11)
RDW: 13.7 % (ref 11.5–15.5)
WBC: 3.7 10*3/uL — ABNORMAL LOW (ref 4.0–10.5)

## 2010-09-02 LAB — COMPREHENSIVE METABOLIC PANEL
ALT: 25 U/L (ref 0–35)
Albumin: 3.4 g/dL — ABNORMAL LOW (ref 3.5–5.2)
BUN: 12 mg/dL (ref 6–23)
CO2: 32 mEq/L (ref 19–32)
Calcium: 8.3 mg/dL — ABNORMAL LOW (ref 8.4–10.5)
Calcium: 8.8 mg/dL (ref 8.4–10.5)
Chloride: 105 mEq/L (ref 96–112)
Creatinine, Ser: 0.53 mg/dL (ref 0.4–1.2)
Creatinine, Ser: 0.58 mg/dL (ref 0.4–1.2)
GFR calc non Af Amer: >60 mL/min (ref >60)
Glucose, Bld: 95 mg/dL (ref 70–99)
Sodium: 139 mEq/L (ref 135–145)
Total Bilirubin: 0.2 mg/dL — ABNORMAL LOW (ref 0.3–1.2)
Total Protein: 6.1 g/dL (ref 6.0–8.3)

## 2010-09-02 LAB — CBC
HCT: 29.5 % — ABNORMAL LOW (ref 36.0–46.0)
Hemoglobin: 10.5 g/dL — ABNORMAL LOW (ref 12.0–15.0)
MCHC: 36.5 g/dL — ABNORMAL HIGH (ref 30.0–36.0)
MCV: 86.6 fL (ref 78.0–100.0)
MCV: 87.4 fL (ref 78.0–100.0)
Platelets: 196 10*3/uL (ref 150–400)
RBC: 3.32 MIL/uL — ABNORMAL LOW (ref 3.87–5.11)
RDW: 13.3 % (ref 11.5–15.5)

## 2010-09-02 LAB — DIFFERENTIAL
Basophils Absolute: 0 10*3/uL (ref 0.0–0.1)
Eosinophils Absolute: 0 10*3/uL (ref 0.0–0.7)
Lymphocytes Relative: 29 % (ref 12–46)
Lymphs Abs: 1.3 10*3/uL (ref 0.7–4.0)
Monocytes Absolute: 0.5 10*3/uL (ref 0.1–1.0)
Monocytes Relative: 9 % (ref 3–12)
Neutro Abs: 3 10*3/uL (ref 1.7–7.7)
Neutrophils Relative %: 55 % (ref 43–77)

## 2010-09-02 LAB — RAPID STREP SCREEN (MED CTR MEBANE ONLY): Streptococcus, Group A Screen (Direct): NEGATIVE

## 2010-09-02 LAB — LIPASE, BLOOD: Lipase: 61 U/L — ABNORMAL HIGH (ref 11–59)

## 2010-09-10 ENCOUNTER — Ambulatory Visit: Payer: Medicaid Other | Admitting: Urology

## 2010-09-15 ENCOUNTER — Emergency Department (HOSPITAL_COMMUNITY): Payer: Medicaid Other

## 2010-09-15 ENCOUNTER — Emergency Department (HOSPITAL_COMMUNITY)
Admission: EM | Admit: 2010-09-15 | Discharge: 2010-09-15 | Disposition: A | Discharge status: Home / Self Care | Payer: Medicaid Other | Attending: Emergency Medicine | Admitting: Emergency Medicine

## 2010-09-15 DIAGNOSIS — F329 Major depressive disorder, single episode, unspecified: Secondary | ICD-10-CM | POA: Insufficient documentation

## 2010-09-15 DIAGNOSIS — K900 Celiac disease: Secondary | ICD-10-CM | POA: Insufficient documentation

## 2010-09-15 DIAGNOSIS — W540XXA Bitten by dog, initial encounter: Secondary | ICD-10-CM | POA: Insufficient documentation

## 2010-09-15 DIAGNOSIS — S61409A Unspecified open wound of unspecified hand, initial encounter: Secondary | ICD-10-CM | POA: Insufficient documentation

## 2010-09-15 DIAGNOSIS — Z79899 Other long term (current) drug therapy: Secondary | ICD-10-CM | POA: Insufficient documentation

## 2010-09-15 DIAGNOSIS — F3289 Other specified depressive episodes: Secondary | ICD-10-CM | POA: Insufficient documentation

## 2010-09-19 ENCOUNTER — Observation Stay (HOSPITAL_COMMUNITY)
Admission: EM | Admit: 2010-09-19 | Discharge: 2010-09-20 | Disposition: A | Discharge status: Home / Self Care | Payer: Medicaid Other | Attending: General Surgery | Admitting: General Surgery

## 2010-09-19 ENCOUNTER — Emergency Department (HOSPITAL_COMMUNITY)
Admission: EM | Admit: 2010-09-19 | Discharge: 2010-09-19 | Disposition: A | Discharge status: Home / Self Care | Payer: Medicaid Other | Source: Home / Self Care | Attending: Emergency Medicine | Admitting: Emergency Medicine

## 2010-09-19 DIAGNOSIS — Z01812 Encounter for preprocedural laboratory examination: Secondary | ICD-10-CM | POA: Insufficient documentation

## 2010-09-19 DIAGNOSIS — Y92009 Unspecified place in unspecified non-institutional (private) residence as the place of occurrence of the external cause: Secondary | ICD-10-CM | POA: Insufficient documentation

## 2010-09-19 DIAGNOSIS — M65839 Other synovitis and tenosynovitis, unspecified forearm: Secondary | ICD-10-CM | POA: Insufficient documentation

## 2010-09-19 DIAGNOSIS — I4892 Unspecified atrial flutter: Secondary | ICD-10-CM | POA: Insufficient documentation

## 2010-09-19 DIAGNOSIS — S61409A Unspecified open wound of unspecified hand, initial encounter: Principal | ICD-10-CM | POA: Insufficient documentation

## 2010-09-19 DIAGNOSIS — Y998 Other external cause status: Secondary | ICD-10-CM | POA: Insufficient documentation

## 2010-09-19 DIAGNOSIS — W540XXA Bitten by dog, initial encounter: Secondary | ICD-10-CM | POA: Insufficient documentation

## 2010-09-19 LAB — CBC
HCT: 33.6 % — ABNORMAL LOW (ref 36.0–46.0)
Hemoglobin: 11.9 g/dL — ABNORMAL LOW (ref 12.0–15.0)
MCH: 29.4 pg (ref 26.0–34.0)
MCHC: 35.4 g/dL (ref 30.0–36.0)
MCV: 83 fL (ref 78.0–100.0)

## 2010-09-19 LAB — DIFFERENTIAL
Basophils Relative: 0 % (ref 0–1)
Lymphocytes Relative: 44 % (ref 12–46)
Lymphs Abs: 2 10*3/uL (ref 0.7–4.0)
Monocytes Absolute: 0.3 10*3/uL (ref 0.1–1.0)
Monocytes Relative: 7 % (ref 3–12)
Neutro Abs: 2.3 10*3/uL (ref 1.7–7.7)

## 2010-09-23 LAB — CULTURE, ROUTINE-ABSCESS

## 2010-09-27 ENCOUNTER — Ambulatory Visit (INDEPENDENT_AMBULATORY_CARE_PROVIDER_SITE_OTHER): Payer: Medicaid Other | Admitting: Urology

## 2010-09-27 DIAGNOSIS — R350 Frequency of micturition: Secondary | ICD-10-CM

## 2010-09-27 DIAGNOSIS — R310 Gross hematuria: Secondary | ICD-10-CM

## 2010-09-27 DIAGNOSIS — R351 Nocturia: Secondary | ICD-10-CM

## 2010-09-28 ENCOUNTER — Emergency Department (HOSPITAL_COMMUNITY)
Admission: EM | Admit: 2010-09-28 | Discharge: 2010-09-28 | Disposition: A | Discharge status: Home / Self Care | Payer: Medicaid Other | Attending: Emergency Medicine | Admitting: Emergency Medicine

## 2010-09-28 DIAGNOSIS — M545 Low back pain, unspecified: Secondary | ICD-10-CM | POA: Insufficient documentation

## 2010-09-28 DIAGNOSIS — K900 Celiac disease: Secondary | ICD-10-CM | POA: Insufficient documentation

## 2010-09-28 DIAGNOSIS — F329 Major depressive disorder, single episode, unspecified: Secondary | ICD-10-CM | POA: Insufficient documentation

## 2010-09-28 DIAGNOSIS — R630 Anorexia: Secondary | ICD-10-CM | POA: Insufficient documentation

## 2010-09-28 DIAGNOSIS — R109 Unspecified abdominal pain: Secondary | ICD-10-CM | POA: Insufficient documentation

## 2010-09-28 DIAGNOSIS — Z79899 Other long term (current) drug therapy: Secondary | ICD-10-CM | POA: Insufficient documentation

## 2010-09-28 DIAGNOSIS — F3289 Other specified depressive episodes: Secondary | ICD-10-CM | POA: Insufficient documentation

## 2010-09-28 DIAGNOSIS — R319 Hematuria, unspecified: Secondary | ICD-10-CM | POA: Insufficient documentation

## 2010-09-28 DIAGNOSIS — I499 Cardiac arrhythmia, unspecified: Secondary | ICD-10-CM | POA: Insufficient documentation

## 2010-09-28 DIAGNOSIS — R112 Nausea with vomiting, unspecified: Secondary | ICD-10-CM | POA: Insufficient documentation

## 2010-09-28 DIAGNOSIS — M79609 Pain in unspecified limb: Secondary | ICD-10-CM | POA: Insufficient documentation

## 2010-09-28 LAB — CBC
HCT: 36.6 % (ref 36.0–46.0)
Hemoglobin: 12.6 g/dL (ref 12.0–15.0)
MCV: 84.1 fL (ref 78.0–100.0)
Platelets: 179 10*3/uL (ref 150–400)
RBC: 4.35 MIL/uL (ref 3.87–5.11)
WBC: 4 10*3/uL (ref 4.0–10.5)

## 2010-09-28 LAB — DIFFERENTIAL
Eosinophils Absolute: 0 10*3/uL (ref 0.0–0.7)
Lymphocytes Relative: 48 % — ABNORMAL HIGH (ref 12–46)
Lymphs Abs: 1.9 10*3/uL (ref 0.7–4.0)
Neutro Abs: 1.7 10*3/uL (ref 1.7–7.7)
Neutrophils Relative %: 43 % (ref 43–77)

## 2010-09-28 LAB — URINALYSIS, ROUTINE W REFLEX MICROSCOPIC
Glucose, UA: NEGATIVE mg/dL
Nitrite: NEGATIVE
Specific Gravity, Urine: 1.01 (ref 1.005–1.030)
pH: 6.5 (ref 5.0–8.0)

## 2010-09-28 LAB — COMPREHENSIVE METABOLIC PANEL
BUN: 11 mg/dL (ref 6–23)
Calcium: 8.7 mg/dL (ref 8.4–10.5)
Glucose, Bld: 83 mg/dL (ref 70–99)
Sodium: 139 mEq/L (ref 135–145)
Total Protein: 7 g/dL (ref 6.0–8.3)

## 2010-09-28 LAB — URINE MICROSCOPIC-ADD ON

## 2010-10-04 ENCOUNTER — Emergency Department (HOSPITAL_COMMUNITY): Payer: Medicaid Other

## 2010-10-04 ENCOUNTER — Emergency Department (HOSPITAL_COMMUNITY)
Admission: EM | Admit: 2010-10-04 | Discharge: 2010-10-04 | Disposition: A | Discharge status: Home / Self Care | Payer: Medicaid Other | Attending: Emergency Medicine | Admitting: Emergency Medicine

## 2010-10-04 DIAGNOSIS — Y92009 Unspecified place in unspecified non-institutional (private) residence as the place of occurrence of the external cause: Secondary | ICD-10-CM | POA: Insufficient documentation

## 2010-10-04 DIAGNOSIS — S63509A Unspecified sprain of unspecified wrist, initial encounter: Secondary | ICD-10-CM | POA: Insufficient documentation

## 2010-10-04 DIAGNOSIS — M545 Low back pain, unspecified: Secondary | ICD-10-CM | POA: Insufficient documentation

## 2010-10-04 DIAGNOSIS — W010XXA Fall on same level from slipping, tripping and stumbling without subsequent striking against object, initial encounter: Secondary | ICD-10-CM | POA: Insufficient documentation

## 2010-10-04 LAB — DIFFERENTIAL
Lymphs Abs: 1.8 10*3/uL (ref 0.7–4.0)
Monocytes Relative: 11 % (ref 3–12)
Neutro Abs: 1.7 10*3/uL (ref 1.7–7.7)
Neutrophils Relative %: 42 % — ABNORMAL LOW (ref 43–77)

## 2010-10-04 LAB — URINALYSIS, ROUTINE W REFLEX MICROSCOPIC
Bilirubin Urine: NEGATIVE
Glucose, UA: NEGATIVE mg/dL
Ketones, ur: NEGATIVE mg/dL
Leukocytes, UA: NEGATIVE
pH: 5 (ref 5.0–8.0)

## 2010-10-04 LAB — CBC
Hemoglobin: 11.5 g/dL — ABNORMAL LOW (ref 12.0–15.0)
MCH: 29.3 pg (ref 26.0–34.0)
MCV: 85.7 fL (ref 78.0–100.0)
RBC: 3.92 MIL/uL (ref 3.87–5.11)

## 2010-10-04 LAB — BASIC METABOLIC PANEL
CO2: 29 mEq/L (ref 19–32)
Calcium: 9 mg/dL (ref 8.4–10.5)
Chloride: 103 mEq/L (ref 96–112)
GFR calc Af Amer: >60 mL/min (ref >60)
Sodium: 139 mEq/L (ref 135–145)

## 2010-10-05 LAB — URINE CULTURE: Culture  Setup Time: 201205111938

## 2010-10-06 ENCOUNTER — Emergency Department (HOSPITAL_COMMUNITY)
Admission: EM | Admit: 2010-10-06 | Discharge: 2010-10-07 | Disposition: A | Discharge status: Home / Self Care | Payer: Medicaid Other | Attending: Emergency Medicine | Admitting: Emergency Medicine

## 2010-10-06 DIAGNOSIS — R112 Nausea with vomiting, unspecified: Secondary | ICD-10-CM | POA: Insufficient documentation

## 2010-10-06 DIAGNOSIS — R1084 Generalized abdominal pain: Secondary | ICD-10-CM | POA: Insufficient documentation

## 2010-10-06 DIAGNOSIS — N390 Urinary tract infection, site not specified: Secondary | ICD-10-CM | POA: Insufficient documentation

## 2010-10-06 DIAGNOSIS — K900 Celiac disease: Secondary | ICD-10-CM | POA: Insufficient documentation

## 2010-10-06 DIAGNOSIS — R300 Dysuria: Secondary | ICD-10-CM | POA: Insufficient documentation

## 2010-10-06 LAB — URINE MICROSCOPIC-ADD ON

## 2010-10-06 LAB — CBC
Platelets: 128 10*3/uL — ABNORMAL LOW (ref 150–400)
RBC: 3.62 MIL/uL — ABNORMAL LOW (ref 3.87–5.11)
RDW: 13.5 % (ref 11.5–15.5)
WBC: 4.6 10*3/uL (ref 4.0–10.5)

## 2010-10-06 LAB — DIFFERENTIAL
Basophils Absolute: 0 10*3/uL (ref 0.0–0.1)
Basophils Relative: 0 % (ref 0–1)
Eosinophils Absolute: 0 10*3/uL (ref 0.0–0.7)
Lymphs Abs: 1.3 10*3/uL (ref 0.7–4.0)
Neutrophils Relative %: 61 % (ref 43–77)

## 2010-10-06 LAB — URINALYSIS, ROUTINE W REFLEX MICROSCOPIC
Glucose, UA: NEGATIVE mg/dL
Specific Gravity, Urine: >1.03 — ABNORMAL HIGH (ref 1.005–1.030)
Urobilinogen, UA: 0.2 mg/dL (ref 0.0–1.0)

## 2010-10-06 LAB — COMPREHENSIVE METABOLIC PANEL
AST: 23 U/L (ref 0–37)
Albumin: 3.2 g/dL — ABNORMAL LOW (ref 3.5–5.2)
Alkaline Phosphatase: 53 U/L (ref 39–117)
Chloride: 105 mEq/L (ref 96–112)
GFR calc Af Amer: >60 mL/min (ref >60)
Potassium: 3.8 mEq/L (ref 3.5–5.1)
Total Bilirubin: 0.2 mg/dL — ABNORMAL LOW (ref 0.3–1.2)
Total Protein: 6 g/dL (ref 6.0–8.3)

## 2010-10-08 LAB — URINE CULTURE
Colony Count: NO GROWTH
Culture  Setup Time: 201205141257

## 2010-10-08 NOTE — Group Therapy Note (Signed)
NAME:  Samantha, [name] NO.:  [id number]   MEDICAL RECORD NO.:  [id number]          PATIENT TYPE:  INP   LOCATION:  A319                          FACILITY:  [facility]   PHYSICIAN:  Edward L. Juanetta Gosling, M.D.DATE OF BIRTH:  1976-03-29   DATE OF PROCEDURE:  DATE OF DISCHARGE:                                 PROGRESS NOTE   [name] is still having nausea and vomiting.   Temperature is 97.4, pulse 69, respirations 20, blood pressure 92/60.  Her abdomen is soft with mild tenderness in the left lower quadrant.  Her chest is clear.   Dr. Despina Hidden has seen her.  She is going to have a transvaginal ultrasound.  In the interest of expediting her workup I am going to go ahead and ask  for GI consultation as well.      Edward L. Juanetta Gosling, M.D.  Electronically Signed     ELH/MEDQ  D:  04/20/2007  T:  04/20/2007  Job:  [id number]

## 2010-10-08 NOTE — Group Therapy Note (Signed)
NAME:  Samantha [name], Samantha [name] NO.:  [id number]   MEDICAL RECORD NO.:  [id number]          PATIENT TYPE:  INP   LOCATION:  A225                          FACILITY:  [facility]   PHYSICIAN:  Edward L. Juanetta Gosling, M.D.DATE OF BIRTH:  February 21, 1976   DATE OF PROCEDURE:  05/10/2007  DATE OF DISCHARGE:                                 PROGRESS NOTE   Samantha [name] is set for surgery this morning.  I do not plan to change  anything for now until we see what the outcome of her surgery is.  The  help of consultants is greatly appreciated.  She says that she is still  having abdominal discomfort, she still does not feel well.  Her  temperature is 97.7, pulse 54, respirations 20, blood pressure 98/58, O2  sats 97% on room air.   PLAN:  As above.      Edward L. Juanetta Gosling, M.D.  Electronically Signed     ELH/MEDQ  D:  05/10/2007  T:  05/10/2007  Job:  [id number]

## 2010-10-08 NOTE — H&P (Signed)
NAME:  Samantha [name], Samantha [name] NO.:  [id number]   MEDICAL RECORD NO.:  [id number]          PATIENT TYPE:  INP   LOCATION:  A225                          FACILITY:  [facility]   PHYSICIAN:  Edward L. Juanetta Gosling, M.D.DATE OF BIRTH:  Jun 23, 1975   DATE OF ADMISSION:  05/05/2007  DATE OF DISCHARGE:  LH                              HISTORY & PHYSICAL   Samantha [name] is a  35 year old who came to the emergency room because of  abdominal pain.  She says that now for a period of about 4 weeks.  She  has had multiple emergency room visits, she was actually hospitalized at  [facility] and then at [facility]. She has had multiple CTs and other  evaluations but continues to have pain.  She said she started vomiting  today, she is still not having any bowel movements. She says her bowels  have not moved in about 2 weeks.  She has an appointment with Dr. Cira Servant,  the gastroenterologist, tomorrow but said she could not wait. During her  hospitalization at [facility], she had consultation with Dr. Stan Head who felt that she had a probably functional disease but with  some possibility that she had celiac disease because of a positive  biopsy.  Her clinical course did not seem to fit that problem.  She had  an evaluation by the electrophysiology team with a wide complex  tachycardia and it was felt that it was vasomotor neurally mediated  syncope not so much a sustained ventricular tachycardia.  She had  pancytopenia that developed and this was felt to possibly be related to  Celexa. At any rate, she came to the emergency room today with continued  abdominal pain, continued problems and was found to have an x-ray that  was consistent with possible small-bowel obstruction.  I should also  mention that she had a surgical consultation while she was at [facility]. There was a single loop of dilated small bowel that was felt to be  a localized ileus, partial small-bowel obstruction and no free air.   PAST MEDICAL HISTORY:  Positive as mentioned for the problem recently.  She has chronic low back pain.  She has had multiple abdominal  surgeries, she has got ovarian cysts.   PAST SURGICAL HISTORY:  She has had a cesarean section x2, hysterectomy,  hernia repair of and umbilical hernia, tonsillectomy and appendectomy.   SOCIAL HISTORY:  She does not use tobacco.  She occasionally uses  alcohol. 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 She feels like she has a lot of  stress. She lives with her current boyfriend. She has a son who had to  have a liver transplant at the age of 1 year, mother has peptic ulcer  disease. She has no known drug allergies.   REVIEW OF SYSTEMS:  Except as mentioned is negative.   PHYSICAL EXAM:  She appears to be in mild distress due to abdominal  pain.  CHEST:  Clear.  HEART:  Regular.  ABDOMEN:  Soft.  EXTREMITIES:  Showed no edema.  CNS:  Grossly  intact.  ABDOMEN:  Her abdomen is actually quite soft and bowel sounds present.  She does not have any real tenderness in the abdomen.  EXTREMITIES:  Showed no edema.  CNS:  Grossly intact. She does appear perhaps somewhat anxious.  HEART:  Shows no abnormalities now, no arrhythmias.  Mucous membranes are minimally dry.   Her CBC shows a white count now of 5300, hemoglobin 12.6, platelets 267,  so her pancytopenia seems to have resolved after her coming off the  Celexa. Urine is negative.  ISTAT, glucose is 115, potassium 3.4,  otherwise negative. Creatinine is 0.9.   PLAN:  Go ahead and get her in the hospital, try to get pain controlled.  I want to give her an enema so we can get her bowels to move. I am going  to replace her potassium because she did have the episode of a wide  complex tachycardia which may or may not have been ventricular  tachycardia and continue with everything else and follow.      Edward L. Juanetta Gosling, M.D.  Electronically Signed     ELH/MEDQ  D:  05/05/2007  T:  05/06/2007  Job:   [id number]

## 2010-10-08 NOTE — Discharge Summary (Signed)
NAME:  Samantha [name], Samantha [name] NO.:  [id number]   MEDICAL RECORD NO.:  [id number]          PATIENT TYPE:  INP   LOCATION:  A227                          FACILITY:  [facility]   PHYSICIAN:  Edward L. Juanetta Gosling, M.D.DATE OF BIRTH:  04-30-1976   DATE OF ADMISSION:  04/19/2007  DATE OF DISCHARGE:  11/28/2008LH                               DISCHARGE SUMMARY   FINAL DISCHARGE DIAGNOSES:  1. Abdominal pain, unknown etiology.  2. Syncopal episode.  3. Ventricular tachycardia.  4. Hypokalemia.  5. Chronic low back pain.  6. Status post hysterectomy.  7. Antral gastritis.  8. Scalloped duodenitis.   HISTORY:  [name] is a 35 year old who was in her usual state of fair  health at home; came to the emergency room here with abdominal pain that  had been going on for about 2 weeks.  She had significant vomiting.  She  has had at least one syncopal episode as an outpatient.  She has had  multiple CT scans.  No etiology has been found.  She has had other x-  rays.  No etiology has been found.  She had a very tender pelvis on  exam.  Blood pressure 120/70, pulse 80, respirations 18.  She was  afebrile.  Her chest was clear.  Heart regular.  Her abdomen showed  diffuse tenderness.  Genital exam was per Dr. Neale Burly.  I did not repeat  her pelvic examination.   LABORATORY WORK:  Normal.   HOSPITAL COURSE:  She had evaluation by the gynecologist, Dr. Despina Hidden,  because of the abdominal discomfort.  He did not feel that she had a  pelvic pathology.  She had a transvaginal ultrasound which showed no  pelvic pathology.  She had a syncopal episode with that, came back to  the floor and had what appeared to be a brief respiratory arrest.  She  was transferred to the intensive care unit, watched carefully there; had  no further episodes.  GI consultation was obtained.  She had a CT  abdomen which did not show any definite abnormalities.  She had surgical  consultation as well in case she needed a  laparoscopy, but her signs and  symptoms did not seem that severe.  She had an endoscopy done and had  biopsies of the antrum and duodenum and had patchy erythema of the  antrum and a scalloped duodenum.  Biopsies of these areas were made.  She had an episode of ventricular tachycardia which she felt, but had no  other symptoms.  I discussed her situation with Dr. Daleen Squibb, and she was  and she was transferred to [facility] for further evaluation.  Her potassium was 3.1.  Her potassium was replaced before she was  transferred.     Edward L. Juanetta Gosling, M.D.  Electronically Signed    ELH/MEDQ  D:  04/23/2007  T:  04/23/2007  Job:  [id number]

## 2010-10-08 NOTE — Consult Note (Signed)
NAME:  Samantha [name], Samantha [name] NO.:  [id number]   MEDICAL RECORD NO.:  [id number]          PATIENT TYPE:  INP   LOCATION:  IC08                          FACILITY:  [facility]   PHYSICIAN:  Kassie Mends, M.D.      DATE OF BIRTH:  11/12/1975   DATE OF CONSULTATION:  04/20/2007  DATE OF DISCHARGE:                                 CONSULTATION   REASON FOR CONSULTATION:  Nausea and vomiting and abdominal pain.   REFERRING PHYSICIAN:  Edward L. Juanetta Gosling, M.D.   HISTORY OF PRESENT ILLNESS:  The patient is a 35 year old Caucasian  female who has a two-week history of lower abdominal pain associated  with nausea and vomiting.  She has had at least 5-6 ER visits between  [facility] and [facility].  She has  had multiple CT's, vaginal ultrasound, upper GI series, which she  reports all to be negative.  We are trying to retrieve her outside  records.  Thus far, her labs have been unrevealing.  She had a pelvic  examination while she was in the ER prior to this admission.  This  apparently was exquisitely tender.  Her Chlamydia and GC probe test  results were negative.  She states that she is having pain in her lower  abdomen, usually more on the right side.  She reports having a prior  appendectomy as a child.  She started having vomiting around the same  time as the pain began.  She describes the pain as constant with  episodes of sharp pain.  This seems to double her over and make her cry.  She denies any hematemesis.  She has not had much bowel movements since  she has had all of the vomiting.  Prior to this, she would have regular  stools.  She denies any blood in her stools or melena.  She did have a  few occasions of bright red blood in the stool back in July of this  year.  She denies any heartburn.   She has had at least two negative HCG's serum.  She is status post  hysterectomy for dysfunctional bleeding back in 2006.  Her sed rate was  normal  at 5.  Urinalysis today was negative.  LFT's are normal.  MET-7  unremarkable.  Amylase and lipase normal.  White count has been normal  and hemoglobin 12.2 today.  Today she had what appeared to be a syncopal  episode, possibly vasovagal and reportedly stopped breathing and  required her to be bagged.  Code team was called.  She is now on the  unit because of this.  Her D-dimer was normal.  Cardiac enzymes x1  negative.  O2 saturations have normalized.  Workup also has included in  house CT of the abdomen and pelvis on November 21, which revealed  bilateral ovarian cysts consistent with her age, suboptimal examination  due to artifact related to prior barium study with barium in her colon.  She had a CT of the abdomen and pelvis today after her syncopal episode  which was normal except  for possible cavitary structure off the distal  small bowel possibly cecal tip.  It was recommended that she have a  small bowel follow through next week after the barium is passed.  She  also had transvaginal ultrasound which revealed a prior hysterectomy,  otherwise normal.   HOME MEDICATIONS:  1. Valium 5 mg three to four times daily as needed.  2. Vicodin 5/500 mg two to three daily as needed.  3. Celexa 20 mg daily.  4. Phenergan p.r.n.   ALLERGIES:  No known drug allergies.   PAST MEDICAL HISTORY:  Chronic low back pain, ovarian cyst, hysterectomy  as outlined above, cesarean section twice.  She is having a hernia  repair at least on three occasions all around the umbilicus.  Tonsillectomy.  Appendectomy.   FAMILY HISTORY:  Mother has peptic ulcer disease.  Her son has Alagille  syndrome requiring a liver transplant at age 49.  She currently lives  with her boyfriend and reports to be in a monogamous relationship.  She  works at [employer].  She has two children.   SOCIAL HISTORY:  Denies any drugs, alcohol, or tobacco use.   REVIEW OF SYSTEMS:  See HPI for GI.  CONSTITUTIONAL:  Denies weight   loss.  CARDIOPULMONARY:  Denies chest pain, palpitations, shortness of  breath.  GENITOURINARY:  She complains of hematuria which she noticed on  one occasion today prior to her catheterization.   PHYSICAL EXAMINATION:  VITAL SIGNS:  Temperature 97.4, pulse 90,  respirations 20, blood pressure 116/80, height 71 inches, weight 79.9  kg.  GENERAL:  Pleasant, well-nourished, well-developed, Caucasian female in  no acute distress.  She appears a little groggy.  SKIN:  Warm and dry with no jaundice.  HEENT:  Normocephalic and atraumatic.  Oropharyngeal mucosa moist and  pink.  No lymphadenopathy.  CHEST:  Lungs clear to auscultation.  HEART:  Regular rate and rhythm.  Normal S1 and S2.  No murmurs, rubs,  or gallops.  ABDOMEN:  Positive bowel sounds.  Soft.  She has moderate tenderness in  the right mid to lower quadrant region to deep palpitation.  No rebound  tenderness, no guarding.  No abdominal bruits or hernias.  EXTREMITIES:  No edema.   LABORATORY DATA:  As mentioned above.   IMPRESSION:  The patient is a 35 year old female with lower right-sided  abdominal pain associated with nausea and vomiting of unclear etiology.  CT, transvaginal/pelvic ultrasound, and labs have all been unrevealing.  She has already had appendectomy in the remote past.  It would be hard  to explain her lower abdominal pain on etiology such as peptic ulcer  disease.  She does not display any symptoms classical for gallbladder  disease, although gallbladder remains in situ.  We will try to retrieve  upper GI series which she reports she has had at [facility] for review.   RECOMMENDATIONS:  1. Retrieve x-rays from [facility].  2. Consider EGD.  We will discuss further with Dr. Cira Servant.  3. 24-hour urine collection for porphyria.  4. If workup unremarkable would consider gastric emptying study to      further evaluate vomiting, although, this      would not explain her  abdominal pain.  5. If workup unremarkable and abdominal pain persists, we could      consider colonoscopy as recommended by Dr. Cira Servant.      Tana Coast, P.A.      Kassie Mends, M.D.  Electronically Signed  LL/MEDQ  D:  04/20/2007  T:  04/20/2007  Job:  [id number]   cc:   Ramon Dredge L. Juanetta Gosling, M.D.  Fax: [phone]

## 2010-10-08 NOTE — Op Note (Signed)
NAME:  Samantha, [name] NO.:  [id number]   MEDICAL RECORD NO.:  [id number]          PATIENT TYPE:  INP   LOCATION:  A225                          FACILITY:  [facility]   PHYSICIAN:  Dalia Heading, M.D.  DATE OF BIRTH:  July 17, 1975   DATE OF PROCEDURE:  05/10/2007  DATE OF DISCHARGE:                               OPERATIVE REPORT   PREOPERATIVE DIAGNOSIS:  Abdominal pain of unknown etiology.   POSTOPERATIVE DIAGNOSES:  1. Abdominal pain of unknown etiology.  2. Adhesive disease.  3. Simple left ovarian cyst.   PROCEDURE:  1. Diagnostic laparoscopy.  2. Laparoscopic lysis of adhesions.   SURGEON:  Franky Macho, MD   ANESTHESIA:  General endotracheal.   INDICATIONS:  The patient is a 35 year old white female who presents  with nonspecific abdominal pain.  She now presents for diagnostic  laparoscopy.  Risks and benefits of the procedure including bleeding,  infection, recurrence of the pain and the possibility of an open  procedure were fully explained to the patient, who gave informed  consent.   PROCEDURE NOTE:  The patient was placed in the supine position.  After  induction of general endotracheal anesthesia, the abdomen was prepped  and draped using the usual sterile technique with Betadine.  Surgical  site confirmation was performed.   A supraumbilical incision was made down to the fascia.  A Veress needle  was introduced into the abdominal cavity and confirmation of placement  was done using the saline drop test.  The abdomen was then insufflated  to 16 mmHg pressure.  An 11-mm trocar was introduced into the abdominal  cavity under direct visualization without difficulty.  The patient was  placed in deeper Trendelenburg position and an additional 5-mm trocar  was placed in the right lower quadrant and an additional 5-mm trocar in  the left lower quadrant region.  The bowel was then inspected from this  proximal small bowel to the terminal ileum.  She did  have part of the  terminal ileum adhesed to a simple left ovarian cyst in the pelvis; this  was freed away using sharp dissection.  Dr. Despina Hidden of Gynecology was  consulted intraoperatively and it was felt that the patient would best  benefit from simple rupture of the left ovarian cyst; this was done  using the harmonic scalpel.  The right ovary was within normal limits.  No abnormal lesions were noted.  There was no evidence of a Meckel's  diverticulum.  The appendix was absent.  The terminal ileum was within  normal limits and no evidence of inflammatory bowel disease was noted.  The patient had 2 lines of omental adhesions in the lower abdomen along  the midline and the right lower paramedian regions.  All adhesions were  freed away using the harmonic scalpel.  The liver, gallbladder, and  stomach were within normal limits.  No abnormal lesions were noted.  The  spleen was briefly visualized and noted be within normal limits.  All  fluid and air were then evacuate from the abdominal cavity prior to  removal of the trocar.  All wounds were irrigated with normal saline.  All wounds were checked  with 0.5% Sensorcaine.  The supraumbilical fascia was reapproximated  using an 0 Vicryl interrupted suture.  All skin incisions were closed  using staples.  Betadine ointment and dry sterile dressings were  applied.   All tape and needle counts were correct at the end of the procedure.  The patient was extubated in the operating room and went back to  recovery room, awake, in stable condition.   COMPLICATIONS:  None.   SPECIMEN:  None.   BLOOD LOSS:  Minimal.      Dalia Heading, M.D.  Electronically Signed     MAJ/MEDQ  D:  05/10/2007  T:  05/11/2007  Job:  [id number]   cc:   Lazaro Arms, M.D.  Fax: [phone]   R. Roetta Sessions, M.D.  [address]  [place]  [place] [address]   Ramon Dredge L. Juanetta Gosling, M.D.  Fax: [phone]

## 2010-10-08 NOTE — Group Therapy Note (Signed)
NAME:  Samantha [name], Samantha [name] NO.:  [id number]   MEDICAL RECORD NO.:  [id number]          PATIENT TYPE:  INP   LOCATION:  A227                          FACILITY:  [facility]   PHYSICIAN:  Edward L. Juanetta Gosling, M.D.DATE OF BIRTH:  14-Dec-1975   DATE OF PROCEDURE:  DATE OF DISCHARGE:  04/23/2007                                 PROGRESS NOTE   Samantha [name] has had an episode of rapid heartbeat that she said she felt  and she also had another one yesterday.  Further questioning reveals  that she says that when she had the episode of syncope in the x-ray  department she felt like she was aware of her heartbeat and felt like it  was beating fast also.  I have faxed a rhythm strip to Dr. Daleen Squibb and he  feels like this is at least potentially ventricular tachycardia which is  what it was called by the monitor, so we are going to transfer her to  [facility] for further evaluation.  She says she does not feel quite as  well as far as her abdomen is concerned.  I told that we will not  forget about her abdomen, but this takes something of a lesser  importance considering a heart problem.   Her exam now shows she is awake and alert; looks fairly comfortable.  Temperature 96.1, pulse 66, respirations 18, blood pressure 105/63, O2  sat 100%.  Her chest is clear.  Heart is regular.  Her abdomen is soft.   She has not had any labs this morning, so I am not aware of what her  potassium is.  BMET done yesterday does show some low potassium, so we  are going to treat that.   PLAN:  Continue with her treatments, transfer her to [facility], see  what is going on with her cardiac rhythm, and follow.  I would give her  some potassium now.      Edward L. Juanetta Gosling, M.D.  Electronically Signed     ELH/MEDQ  D:  04/23/2007  T:  04/23/2007  Job:  [id number]

## 2010-10-08 NOTE — Discharge Summary (Signed)
NAME:  Samantha [name], Samantha [name] NO.:  [id number]   MEDICAL RECORD NO.:  [id number]          PATIENT TYPE:  INP   LOCATION:  A225                          FACILITY:  [facility]   PHYSICIAN:  Dalia Heading, M.D.  DATE OF BIRTH:  02-11-1976   DATE OF ADMISSION:  05/05/2007  DATE OF DISCHARGE:  12/20/2008LH                               DISCHARGE SUMMARY   HOSPITAL COURSE SUMMARY:  The patient is a 35 year old white female who  presented with chronic abdominal pain.  She has had multiple emergency  room visits, as well as hospitalizations at both [facility] and [facility] for abdominal pain.  Her workup has only shown evidence of celiac  disease.  She was brought into the hospital for further evaluation and  treatment.  Both surgery and gastroenterology were consulted.  Given her  lack of findings on multiple x-rays, it was elected to proceed with a  diagnostic laparoscopy.  This was performed on May 10, 2007.  At  the time of surgery, she did have a simple left ovarian cyst which was  inspected by Dr. Duane Lope and ruptured.  In addition, she did have  some small bowel adhesions into the pelvis.  These were lysed without  difficulty.  The patient is status post an appendectomy in the remote  past.  Her postoperative course was remarkable for ongoing pain which  has subsequently resolved once she started having bowel movements.  She  is being discharged home on May 15, 2007 in fair and stable  condition.   DISCHARGE INSTRUCTIONS:  The patient is to follow up Dr. Franky Macho on  May 18, 2007.   DISCHARGE MEDICATIONS:  1. Valium 5 mg p.o. t.i.d.  2. Celexa 20 mg p.o. daily.  3. Pepcid 20 mg p.o. daily.  4. Metoprolol 50 mg p.o. b.i.d.  5. Zofran 4 mg p.o. q.6h. p.r.n. nausea.  6. Vicodin 1-2 tablets p.o. q.4h. p.r.n. pain, dispense 50, no      refills.   PRINCIPAL DIAGNOSES:  1. Chronic abdominal pain.  2. Celiac disease.  3. Adhesive disease.  4.  Anxiety disorder.  5. History of supraventricular tachycardia.   PRINCIPAL PROCEDURE:  Diagnostic laparoscopy, lysis of adhesions on  May 10, 2007.      Dalia Heading, M.D.  Electronically Signed     MAJ/MEDQ  D:  05/15/2007  T:  05/16/2007  Job:  [id number]   cc:   Ramon Dredge L. Juanetta Gosling, M.D.  Fax: [phone]   [facility]

## 2010-10-08 NOTE — Group Therapy Note (Signed)
NAME:  Samantha [name], Samantha [name] NO.:  [id number]   MEDICAL RECORD NO.:  [id number]          PATIENT TYPE:  INP   LOCATION:  A225                          FACILITY:  [facility]   PHYSICIAN:  Edward L. Juanetta Gosling, M.D.DATE OF BIRTH:  10-19-1975   DATE OF PROCEDURE:  DATE OF DISCHARGE:                                 PROGRESS NOTE   Ms. Florendo says she is overall about the same.  She has no new  complaints.  She says that she her bowels have not moved but she feels  like they will.   PHYSICAL EXAMINATION:  Otherwise she is awake and alert.  She looks more  comfortable.  Her temperature is 98.6, pulse is 56, respirations 18, blood pressure  128/63, O2 sats 100%.  Her chest is clear.  Heart is regular.  ABDOMEN:  Soft.   ASSESSMENT:  She is, I think, getting somewhat better.   PLAN:  Per GI team, she is going to have 3 doses of laxative and see how  she does.      Edward L. Juanetta Gosling, M.D.  Electronically Signed     ELH/MEDQ  D:  05/13/2007  T:  05/13/2007  Job:  [id number]

## 2010-10-08 NOTE — Consult Note (Signed)
NAME:  NHYIRA, LEANO NO.:  [id number]   MEDICAL RECORD NO.:  [id number]          PATIENT TYPE:  INP   LOCATION:  3741                         FACILITY:  [facility]   PHYSICIAN:  Duke Salvia, MD, FACCDATE OF BIRTH:  Jun 28, 1975   DATE OF CONSULTATION:  04/26/2007  DATE OF DISCHARGE:                                 CONSULTATION   Thank you very much for asking [place] to see Samantha [name] in consultation  because of nonsustained ventricular tachycardia.   Samantha [name] is a 35 year old woman with a past history of multiple GI  surgeries and who presented to the emergency room a couple of days ago  with an antecedent history for 2 weeks of abdominal discomfort  associated with nausea, vomiting.  The intercurrent with these spells  she has had now six episodes of syncope one which occurred today at  hospital.  These are quite stereotypical characterized by dizziness,  seeing white, lightheadedness, nausea, vomiting, flushing and  diaphoresis.  She then loses consciousness as she did today.  There is  no change on her telemetry.  She had some nausea in the recovery phase.  There is no nursing note that I can find right now related to the  episode today, but I have gone over with telemetry tech and it is clear  that the episode occurred with normal sinus rhythm.   She is an antecedent history of syncope that was somewhat similar, but  not clearly so.  This occurred with phlebotomy.  There is another  episode that occurred shortly after phlebotomy later that day because  she got too warm.   She has no cardiac history.  She has had no previous mentions of  irregular rhythm.  She had palpitations the day she was admitted to  [facility] and this correlated with nonsustained ventricular  tachycardia.   Her evaluation here to date has been notable for:  1. A normal echo  2. GI evaluation with a possibility of sprue and some duodenitis.  3. Multiple negative CT  scans.   She has also had laboratories notable for potassium of 3.1, a white  count 2.9, a platelet count of 150,000 or so and a hemoglobin of less  than 9.   PAST MEDICAL HISTORY:  In addition to the above is notable for:  1. Chronic low back pain.  2. Prior hysterectomy secondary to metromenorrhagia.  3. Status post hernia repair.  4. Depression with anxiety.  5. Prior appendectomy.   SOCIAL HISTORY:  She has a son who has a liver transplant. She lives a  [place].  She does not use cigarettes or recreational drugs.  She  does use alcohol occasionally.   MEDICATIONS:  As outpatient include Valium, Vicodin, Celexa and  Phenergan. Her Celexa has been on board for a about a year.   ALLERGIES:  NO KNOWN DRUG ALLERGIES.   SOCIAL HISTORY:  She has two children.  As noted, she is divorced and  lives with her boyfriend.   PHYSICAL EXAMINATION:  GENERAL:  She is a well-appearing young Caucasian  female appearing  her age of 5.  VITAL SIGNS:  Her blood pressure was 92/50.  Her pulse was 67.  NECK:  Her neck veins were flat.  Her carotids were brisk and full  bilaterally out bruits.  BACK:  Without kyphosis or scoliosis.  RESPIRATORY:  Lungs were clear.  HEART:  Sounds were regular without murmurs or gallops.  ABDOMEN:  The abdomen was soft with active with active bowel sounds.  The the abdomen however was exquisitely tender.  EXTREMITIES: Without clubbing, cyanosis or edema.  NEUROLOGICAL:  Exam was grossly normal.  SKIN:  The skin was warm and dry.   Electrocardiogram dated yesterday demonstrated sinus rhythm at 62,  interval 0.06/0.8/0.43. Electrocardiogram was normal.   Telemetry strips from [facility] demonstrated episodes of wide  complex tachycardia that  morphologically were suggestive of a  ventricular tachycardia however, as best as I can tell there appears to  be a P-wave deflection preceding these wide complex beats and I am not  sure were whether these  represent SVT or ventricular tachycardia. At  least on one occasion a couplet is associated without perturbation of  the P-waves. On another occasion they do not march out.   IMPRESSION:  1. Recurrent syncope - vasomotor apparently neurally mediated,      frequently preceded by nausea and vomiting and more remotely with      phlebotomy.  2. Multiple GI surgeries with a GI evaluation ongoing with recurrent      problems with severe abdominal pain.  3. Nonsustained wide complex rhythm that is possibly ventricular,      though I cannot be sure.  4. Normal echo.  5. Pancytopenia.   Samantha [name] has neurally mediated syncope in the setting of abdominal  discomfort.  I do not think that this is related to her wide complex  rhythm, although this wide complex rhythm might serve as another trigger  for her vasomotor reactivity.   I am not quite sure how vigorously to pursue the wide complex rhythm at  this point, though I think it is probably reasonable to consider an MR  scan to look at the structural integrity of her myocardium and then some  consideration could be given to placing catheters in her heart for  clarification.  However, I think the abdominal process and the  syncope secondary to it is the primary issue.  I am not quite sure how  the pancytopenia relates.  We will need to defer to GI in this primary  evaluation.   We will follow with you.  Thank you very much for the consultation.      Duke Salvia, MD, [facility]  Electronically Signed     SCK/MEDQ  D:  04/26/2007  T:  04/27/2007  Job:  [id number]   cc:   Ramon Dredge L. Juanetta Gosling, M.D.  Iva Boop, MD,FACG

## 2010-10-08 NOTE — Op Note (Signed)
NAME:  Samantha [name], Samantha [name] NO.:  [id number]   MEDICAL RECORD NO.:  [id number]          PATIENT TYPE:  INP   LOCATION:  IC08                          FACILITY:  [facility]   PHYSICIAN:  Kassie Mends, M.D.      DATE OF BIRTH:  09/12/1975   DATE OF PROCEDURE:  04/21/2007  DATE OF DISCHARGE:                               OPERATIVE REPORT   REFERRING PHYSICIAN:  Edward L. Juanetta Gosling, M.D.   PROCEDURE:  Esophagogastroduodenoscopy with cold forceps biopsies of the  antrum and the duodenum.   INDICATION FOR EXAM:  Ms. Senseney is a 35 year old female who presents  with persistent vomiting for the last 2 weeks.  She did have a viral  illness in October.  She has been vomiting 8-9 times per day.  She has  lost 6-7 pounds.  She also complains of a dull pain in the right lower  quadrant that occasionally feels like a contraction.  She denies any  rectal bleeding.  She has had no trauma to her abdomen.  She denies any  tobacco or alcohol use.  She has not used any recreational drugs.  She  cannot identify any precipitating factors.  She denies any stress.  She  has been seen in the emergency room 3 times at [facility] and once at [facility].  She denies any problems  swallowing or indigestion.  She denies any physical abuse or sexual  abuse.  The upper endoscopy is being performed to evaluate for  persistent vomiting.   FINDINGS:  1. Normal esophagus without evidence of Barrett's, mass, erosion,      stricture or ulceration.  2. Mild patchy erythema in the body and the antrum of the stomach.      Biopsies obtained via cold forceps to evaluate for eosinophilic      gastritis or H. pylori gastritis.  3. Normal duodenal bulb.  The folds of the duodenum appear scalloped.      Biopsies obtained via cold forceps to evaluate for celiac sprue or      eosinophilic gastroenteritis.   DIAGNOSIS:  No obvious source for vomiting identified.   RECOMMENDATIONS:  1.  Continue b.i.d. PPI.  2. NG tube to low wall suction because the patient was unable to      tolerate the NG tube clamped last night.  3. Will await biopsies.  4. Gastric emptying study on April 22, 2007.  5. Add around-the-clock Phenergan 12.5 mg IV every 6 hours and      continue Zofran as needed.  6. Continue 24-hour urine collection for porphyrinogens.   MEDICATIONS:  1. Demerol 75 mg IV.  2. Versed 3 mg IV.  3. Phenergan 12.5 mg IV.   PROCEDURE TECHNIQUE:  Physical exam was performed and informed consent  was obtained from the patient after explaining the benefits, risks and  alternatives to the procedure.  The patient was connected to the monitor  and placed in the left lateral position.  Continuous oxygen was provided  by nasal cannula and IV medicine administered through an indwelling  cannula.  After  administration of sedation, the patient's esophagus was  intubated and the scope was advanced under direct visualization to the  second portion of the duodenum.  The scope was removed slowly by  carefully examining the color, texture, anatomy and integrity of the  mucosa on the way out.  The patient was recovered in endoscopy and  discharged to the floor in satisfactory condition.      Kassie Mends, M.D.  Electronically Signed     SM/MEDQ  D:  04/21/2007  T:  04/21/2007  Job:  [id number]

## 2010-10-08 NOTE — Assessment & Plan Note (Signed)
NAME:  Samantha [name], Samantha [name]                 CHART#:  [id number]   DATE:  07/20/2007                       DOB:  02/25/1976   SUBJECTIVE:  She presents as return patient visit.  She denies any  abdominal pain.  MiraLax allows her to have more than 1 bowel movement a  week.  If she eats wheat, she goes to the bathroom very quickly.  She is  getting her gluten free foods from [facility].  Things are much  better.  She has gained weight.   MEDICATIONS:  1. Celexa 20 mg daily.  2. Pepcid 20 mg b.i.d.  3. Verapamil 120 mg daily.  4. Valium as needed.   OBJECTIVE:  Weight 198 pounds  (up 20 pounds since November 2008).  Height 5 feet 1 inch.  Temperature 98.7.  Blood pressure 110/72.  Pulse  60.  GENERAL:  She is no apparent distress.  LUNGS:  Clear to auscultation bilaterally.  CARDIOVASCULAR EXAM:  Regular rhythm.  No murmur.  ABDOMEN:  Bowel sounds are present.  Soft, nontender and non-distended.   ASSESSMENT:  Samantha [name] is a 35 year old female who has celiac disease.  She is still learning to cope with the gluten free diet.  Thank you for  allowing me to see Samantha [name] in consultation.  My recommendations  follow.   RECOMMENDATIONS:  1. She may reduce her Pepcid to once a day.  2. She should continue a gluten free diet.  3. Follow up appointment in 6 months, and encouraged her to lose      weight.       Kassie Mends, M.D.  Electronically Signed     SM/MEDQ  D:  07/20/2007  T:  07/21/2007  Job:  [id number]   cc:   Ramon Dredge L. Juanetta Gosling, M.D.

## 2010-10-08 NOTE — Group Therapy Note (Signed)
NAME:  REY, FORS NO.:  [id number]   MEDICAL RECORD NO.:  [id number]          PATIENT TYPE:  INP   LOCATION:  A225                          FACILITY:  [facility]   PHYSICIAN:  Edward L. Juanetta Gosling, M.D.DATE OF BIRTH:  02-Aug-1975   DATE OF PROCEDURE:  DATE OF DISCHARGE:                                 PROGRESS NOTE   Ms. Steinhilber says she is a little better.  Bowels moved a little bit last  night.  Otherwise she is about the same.   EXAM:  Shows temperature is 97.1, pulse 57, respirations 20, blood  pressure 97/61, O2 sat 99% on room air.  Her abdomen is actually fairly soft.  Her chest is clear.   ASSESSMENT:  She is better.   PLAN:  We are going to get an acute abdominal series this morning.  Depending on the results of that, maybe we can give her something by  mouth to help her bowels move.  We have a GI consult pending.      Edward L. Juanetta Gosling, M.D.  Electronically Signed     ELH/MEDQ  D:  05/06/2007  T:  05/06/2007  Job:  [id number]

## 2010-10-08 NOTE — Group Therapy Note (Signed)
NAME:  RAAHI, KORBER NO.:  [id number]   MEDICAL RECORD NO.:  [id number]          PATIENT TYPE:  INP   LOCATION:  A225                          FACILITY:  [facility]   PHYSICIAN:  Edward L. Juanetta Gosling, M.D.DATE OF BIRTH:  Oct 05, 1975   DATE OF PROCEDURE:  DATE OF DISCHARGE:                                 PROGRESS NOTE   Ms. Ostlund had her surgery yesterday, but says she still feels the same  pain that she had before.  She actually looks a little bit better.   PHYSICAL EXAMINATION:  VITAL SIGNS:  A blood pressure is 99/59,  temperature is 98.6, pulse 72, respirations 20, O2 SATs 99%.  CHEST:  Her chest is clear.  HEART:  Her heart is regular.  ABDOMEN:  Her abdomen is soft.   Her white count 3800, hemoglobins 10, electrolytes were normal.   I am going to go ahead and order an assessment for porphyria.  exam      Edward L. Juanetta Gosling, M.D.  Electronically Signed     ELH/MEDQ  D:  05/11/2007  T:  05/11/2007  Job:  [id number]

## 2010-10-08 NOTE — Group Therapy Note (Signed)
NAME:  Samantha [name], Samantha [name] NO.:  [id number]   MEDICAL RECORD NO.:  [id number]          PATIENT TYPE:  INP   LOCATION:  A225                          FACILITY:  [facility]   PHYSICIAN:  Catalina Pizza, M.D.        DATE OF BIRTH:  1975-08-13   DATE OF PROCEDURE:  05/10/2007  DATE OF DISCHARGE:                                 PROGRESS NOTE   SUBJECTIVE:  Samantha [name] is a 35 year old white female here with constant  abdominal pain which she states she has had for greater than 1 month.  States she has not had any bowel movement over 1 month as well as not  been able to take anything in with nausea and vomiting.  She did have  some Jello and a biscuit this morning which caused a little bit upset  stomach but has not vomited that back up.   PHYSICAL EXAMINATION:  VITAL SIGNS:  Temperature 97.8, blood pressure  95/42, pulse 56, respiration 20, sating 98% on room air.  GENERAL:  This is an obese white female sitting up in bed in no acute  distress.  HEENT is unremarkable.  ABDOMEN:  Soft.  LUNGS:  Clear to auscultation bilaterally.  HEART:  Regular rate and rhythm.  EXTREMITIES:  No lower extremity edema.   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   ASSESSMENT/PLAN:  She has had significant GI workup and has been seen by  Dr. Trina Ao and will have exploratory laparotomy done tomorrow by Dr.  Lovell Sheehan. She did review briefly about celiac sprue and that she will  have to change her diet extensively. This may be causing some problems  that she is having but did not believe that she has not had anything out  from bowel movement standpoint over the last month which she reports.  She likely has some psychiatric motivation of this as well and this will  need to be addressed at some time. Nutrition should review celiac  sprue  gluten-free diet with her prior to discharge. Everything else is stable  with her.      Catalina Pizza, M.D.  Electronically Signed     ZH/MEDQ  D:  05/09/2007  T:  05/10/2007  Job:  [id number]

## 2010-10-08 NOTE — Letter (Signed)
June 22, 2008    Edward L. Juanetta Gosling, MD  [address]  [place], [place] [address]   RE:  [name], [name]  MRN:  [id number]  /  DOB:  07-18-1975   Dear Renae Fickle,   I hope this letter finds you well.  Samantha [name] came in today in  followup for a wide-complex tachycardia that had occurred in hospital  while she was being evaluated for abdominal pain.  She has  intercurrently undergone surgery with an oophorectomy.  Her belly is  somewhat better.  She has had no cardiac issues.   Her major complaint is fatigue.  She does have daytime somnolence.  She  falls asleep in class and she snores.  I do not have the information on  her Epworth score, but is certainly high.   CURRENT MEDICATIONS:  Verapamil, Prevacid, and Celexa.   PHYSICAL EXAMINATION:  VITAL SIGNS:  Her blood pressure is 117/74, her  pulse is 79.  Her weight was 213 pounds, which is up staggering 35  pounds in the last year.  This certainly may be contributing as well.  BACK:  Without kyphosis.  LUNGS:  Clear.  HEART:  Sounds were regular.  EXTREMITIES:  Without edema.   Electrocardiogram demonstrated sinus rhythm at 79 with intervals of  0.15/0.08/0.39, the axis was 35 degrees.  The electrocardiogram was  otherwise normal.   IMPRESSION:  1. Wide-complex tachycardia in the setting of normal heart  2. Fatigue with daytime somnolence and nocturnal obstructive      breathing.  3. Intercurrent weight gain.   Samantha [name] heart issues do not seem to be currently active.  I have  arranged for her to get a sleep study at [facility].  I have asked them  to forward the results of these to you.   If there is anything further we can do in her care, please do not  hesitant to contact me.    Sincerely,      Duke Salvia, MD, [facility]  Electronically Signed    SCK/MedQ  DD: 06/22/2008  DT: 06/23/2008  Job #: [id number]

## 2010-10-08 NOTE — Consult Note (Signed)
NAME:  Samantha [name], Samantha [name] NO.:  [id number]   MEDICAL RECORD NO.:  [id number]          PATIENT TYPE:  INP   LOCATION:  3741                         FACILITY:  [facility]   PHYSICIAN:  Genene Churn. Granfortuna, M.D.DATE OF BIRTH:  [birth year]   DATE OF CONSULTATION:  04/27/2007  DATE OF DISCHARGE:                                 CONSULTATION   RESPONSIBLE PHYSICIAN:  Maisie Fus C. Wall, MD, [facility].   REASON FOR CONSULTATION:  Pancytopenia.   HISTORY OF PRESENT ILLNESS:  Samantha [name] is a 35 year old white woman  with a past medical history of chronic low back pain and chronic  abdominal pain secondary to multiple abdominal surgeries, on chronic  narcotics, who was seen at [facility] last week and then was  transferred here for 2 weeks of nausea and vomiting, intractable, 8-12  times a day, without hematemesis, not clearly related to food intake.  She also developed right lower quadrant abdominal pain approximately 2  days later.   She denies having any diarrhea.  In fact, she had been approximately 10  days without a bowel movement until she got an enema here in the  hospital.  She denies fevers and chills.  She denies any sick contacts  in the past month.  She also is currently being evaluated for 3 episodes  of syncope that are usually preceded by lightheadedness plus/minus  palpitations, and she has been noted to have had a few runs of  ventricular tachycardia since admission that are not necessarily  associated with the symptoms.   Her GI workup so far includes a CT scan of the abdomen and pelvis  showing only a possible right colon diverticulum; a normal transvaginal  ultrasound, except for ovarian cysts; an EGD showing mild gastritis,  with a biopsy showing villous atrophy; and a gastric emptying study that  was negative for gastroparesis, but note that the patient was on Reglan  at the time.   She is now 3 weeks after the onset of her symptoms and is still  vomiting  on a daily basis despite being on Zofran before each meal; however, this  is better at 2-3 times a day.  She was noted to become slightly  pancytopenic, so we were consulted.  She denies any blood loss.  She  denies any recent viral infection or recent medication changes.  She  denies a history of recurrent sinopulmonary or skin infections.   PAST MEDICAL HISTORY:  1. Chronic low back pain.  2. Chronic abdominal pain.  3. Ovarian cysts.   PAST SURGICAL HISTORY:  1. Cesarean section x2.  2. Partial hysterectomy.  3. Three times hernia repair of umbilical hernias.  4. Tonsillectomy.  5. Appendectomy.   SOCIAL HISTORY:  She denies tobacco use.  She drinks alcohol on  occasion.  She works at [employer] in Clinical biochemist.  She has 2 children,  37 and 46 years old, and she lives with her current boyfriend.   FAMILY HISTORY:  She has a son with Alagille syndrome who had a liver  transplant at the age of 34 year old.  She has a mother with peptic ulcer  disease.   CHRONIC MEDICATIONS:  1. Celexa 20 mg p.o. daily.  2. Vicodin 5/500 mg 2-3 times a day p.r.n.  3. Valium 5 mg 3-4 times a day p.r.n.  4. She has also been on Phenergan p.r.n. since her symptoms of nausea      and vomiting started.   ALLERGIES:  SHE HAS NO KNOWN DRUG ALLERGIES.   REVIEW OF SYSTEMS:  As per HPI.  She has no ENT or pulmonary symptoms  specifically, and she has no vaginal bleeding.   PHYSICAL EXAMINATION:  VITAL SIGNS:  Temperature is 98.1, heart rate 87,  respiratory rate 18, blood pressure 102/56, and saturating oxygen 95% on  room air.  She is not orthostatic.  GENERAL:  She is awake, alert, and oriented x3.  ENT:  Moist mucous membranes.  NECK:  No cervical lymphadenopathy.  HEART:  Regular rate and rhythm.  No murmurs, rubs, or gallops.  LUNGS:  Clear to auscultation bilaterally.  ABDOMEN:  Soft.  Bowel sounds positive.  Obese.  Tender throughout, but  worse in the right lower quadrant.  She  has no inguinal lymphadenopathy.  LOWER EXTREMITIES:  No edema or pain.   LABORATORIES:  Her last CBC in the hospital was on April 25, 2007,  which showed a white count of 2.9, with a differential showing 44%  neutrophils and 45% lymphocytes.  Hemoglobin 10.5, hematocrit 28.8, MCV  84.3, RDW 12.9, and platelets at 153.  Reviewing her records, we found  that she has had mild leukopenia documented since at least December 03, 2003, with a white count of 4.5 at that time and as low as 3.7 on August 16, 2004.  She has had 2 episodes of isolated low platelet count of 134  and 135 this admission, and she has had an acute fall in hemoglobin from  13.9 on November 24 to 10.5 on November 30.  Folate 5.4, ferritin 106,  total iron 48, TIBC 325, percent saturation 15, vitamin B12 of 727,  reticulocyte percentage of 1.0, reticulocyte absolute count 36.6, and  RBC count of 3.66.  The blood smear was reviewed and showed mature  cells, with normal granulation, normal count, and no evidence of viral  infections; blood smear was basically normal.   IMPRESSION:  1. Mild chronic benign leukopenia.  This is of no apparent      consequence.  It may be low-normal versus medication-related.      Celexa and other selective serotonin reuptake inhibitors can      sometimes be associated with leukopenia.  I would recommend      tapering off her Celexa and changing it to a different class, if      needed.  We will also check a viral panel of HIV, EBV, CMV, and      hepatitis.  I doubt her mild leukopenia and anemia is related to      adult celiac disease , as her history does not fit, there are no      signs or symptoms of malabsorption, iron and vitamin studies are      normal except for borderline folic acid level, and her albumin is      normal.  However, the tissue transglutaminase antibodies are still      pending.  2. Isolated minimal drop in platelet count, which has now resolved.  3. Anemia, likely  secondary to acute illness.  There is no evidence  for hemolysis or bleeding.  She has 2 stool Hemoccults that are      negative.   RECOMMENDATIONS:  1. Taper and stop Celexa.  Use alternative antidepressant.  2. There is no indication for bone marrow biopsy at this time.  3. Hold oral narcotics to assess the effect on her nausea and      vomiting.   Prudencio Pair MD, FACP  cc Dr Yancey Flemings; [facility]; Kari Baars; [facility]      Dellia Beckwith, M.D.  Electronically Signed      Genene Churn. Cyndie Chime, M.D.  Electronically Signed    VD/MEDQ  D:  04/28/2007  T:  04/29/2007  Job:  [id number]

## 2010-10-08 NOTE — Consult Note (Signed)
NAME:  RAECHELL, SINGLETON NO.:  [id number]   MEDICAL RECORD NO.:  [id number]          PATIENT TYPE:  INP   LOCATION:  3707                         FACILITY:  [facility]   PHYSICIAN:  Iva Boop, MD,FACGDATE OF BIRTH:  04-17-1976   DATE OF CONSULTATION:  DATE OF DISCHARGE:                                 CONSULTATION   ADDENDUM:  She has pancytopenia developing.  Etiology for this is not clear.  It  does raise the question of a viral process, perhaps it is causing her  acute 2 week syndrome.   We will check an anemia panel and followup the CBC.      Iva Boop, MD,FACG  Electronically Signed     CEG/MEDQ  D:  04/24/2007  T:  04/25/2007  Job:  [id number]   cc:   Kassie Mends, M.D.  Pricilla Riffle, MD, [facility]

## 2010-10-08 NOTE — Group Therapy Note (Signed)
NAME:  FAHIMA, CIFELLI NO.:  [id number]   MEDICAL RECORD NO.:  [id number]          PATIENT TYPE:  INP   LOCATION:  IC08                          FACILITY:  [facility]   PHYSICIAN:  Edward L. Juanetta Gosling, M.D.DATE OF BIRTH:  09/30/1975   DATE OF PROCEDURE:  DATE OF DISCHARGE:                                 PROGRESS NOTE   Ms. Hauser is overall I think a little bit better.  She continues to  have abdominal discomfort.  She continues to have some vomiting.  She  had what may have been a respiratory arrest yesterday.  She has had  multiple evaluations.  The blood pressure 128/80, pulse 87, temperature  37.2.  Her white blood count this morning is 3200, hemoglobin 12.2,  platelets 168, electrolytes are normal.  She still has significant  abdominal discomfort.  She still had nausea and vomiting.  She is sent  for a EGD today.  I am going to see if we can get a PICC line today and  otherwise continue with her treatments and no other new meds.  She did  respond better to Dilaudid last night than she had to morphine and did  not have any untoward effects.  I do not think that her episode  yesterday was related to Dilaudid.  I think it is more related to a  vagal phenomenon.      Edward L. Juanetta Gosling, M.D.  Electronically Signed     ELH/MEDQ  D:  04/21/2007  T:  04/21/2007  Job:  [id number]

## 2010-10-08 NOTE — Group Therapy Note (Signed)
NAME:  Samantha [name], Samantha [name] NO.:  [id number]   MEDICAL RECORD NO.:  [id number]          PATIENT TYPE:  INP   LOCATION:  A225                          FACILITY:  [facility]   PHYSICIAN:  Edward L. Juanetta Gosling, M.D.DATE OF BIRTH:  1976/02/18   DATE OF PROCEDURE:  05/14/2007  DATE OF DISCHARGE:                                 PROGRESS NOTE   Samantha [name] says she is vomiting again today.  She did have some small  bowel movements yesterday.   PHYSICAL EXAMINATION:  She looks uncomfortable but not a acutely.  Her temperature is 97.8, pulse 68, respirations 16, blood pressure  107/52, O2 sats 99% on room air.  Her chest is clear.  Her abdomen is fairly soft.  Extremities showed no edema.   ASSESSMENT:  She continues to have abdominal problems and it is not  clear what the problem is.  Her urine for porphobilinogen is still  pending.   PLAN:  Continue her treatments and follow.      Edward L. Juanetta Gosling, M.D.  Electronically Signed     ELH/MEDQ  D:  05/14/2007  T:  05/14/2007  Job:  [id number]

## 2010-10-08 NOTE — Discharge Summary (Signed)
NAME:  Samantha [name], CAMPA NO.:  [id number]   MEDICAL RECORD NO.:  [id number]          PATIENT TYPE:  INP   LOCATION:  3741                         FACILITY:  [facility]   PHYSICIAN:  Jesse Sans. Wall, MD, FACCDATE OF BIRTH:  01/04/1976   DATE OF ADMISSION:  04/23/2007  DATE OF DISCHARGE:                               DISCHARGE SUMMARY   Dictation greater than 45 minutes.   No known drug allergies.   FINAL DIAGNOSES:  1. Admitted with abdominal pain/persistent wracking nausea and      vomiting after each meal/syncope/nonsustained ventricular      tachycardia.  2. Pancytopenia with hematology consult.  3. Constipation, now resolved.   SECONDARY DIAGNOSES:  1. Chronic abdominal pain secondary to adhesions from multiple prior      surgeries.  2. Chronic lower back pain on chronic narcotic therapy.  3. Status post hysterectomy for menorrhagia.  4. Status post cesarean section x2.  5. Status post umbilical herniorrhaphies x3.  6. Status post tonsillectomy and appendectomy.   PROCEDURES THIS ADMISSION:  Will be outlined as we go through the  systems and the consults who have helped [place] with these system  investigations.  1. Gynecology:  No pelvic pathology on transvaginal ultrasound      November 25.  2. Gastroenterology:      a.     Computed tomogram of the abdomen:  No definite       abnormalities.      b.     EGD biopsy of the antrum and duodenum.  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      c.     Gastric emptying study December 2 normal, no gastroparesis.      d.     Constipation now resolved after laxative therapy.      e.     GI summation:  Abdominal pain most likely musculoskeletal       wall versus functional.  NO ACUTE GI PROCESS.  Nausea, vomiting       improving, possible place for psychiatric evaluation for this       patient.  3. Hematology  consult for pancytopenia to try to find a unifying      diagnosis in this patient.      a.     There is no unifying diagnosis to link cardiac, GI, and       hematologic problems.      b.     Mild chronic leukopenia of no clinical significance       (possibly low-normal versus of Celexa effect).      c.     Recommend taper and stop Celexa, use alternate       antidepressant.      d.     Acute illness anemia, no evidence for hemolysis or occult       blood loss.      e.     No indication for bone marrow biopsy  at this time  4. Cardiology system:      a.     NSVT with dizziness as a symptom. better on beta blocker       therapy.      b.     Syncope, vasomotor, neurally mediated versus functional       during a witnessed syncopal event.  There was no bradycardia and       no hypotension.      c.     Cardiac MRI December 3, ejection fraction 63%:  The left       ventricle:  No regional wall motion abnormalities, no       hyperenhancement, no infiltration, no scarring.  The right       ventricle:  No regional wall motion abnormalities, no diverticula,       no fatty infiltration, no right ventricular dysplasia, normal       coronary ostia, no congenital abnormalities.      d.     A 2-D echocardiogram November 28:  EF 60%, no mitral       regurgitation, right ventricle is normal, left ventricle normal.   The patient will discharge on the following medications:  1. Celexa 20 mg daily but hematology recommends weaning this and      substituting another antidepressant.  She is to discuss this with      Dr. Despina Hidden.  2. MiraLax 17 g in 8 ounces of water or juice daily for constipation      and to keep the bowels regular.  3. Pepcid 20 mg twice daily - a new medication.  4. Metoprolol 50 mg twice daily for heart rhythm - a new medication.  5. Vicodin 5/500 one to two tablets every 4 hours.  6. Valium 5 mg three to four times daily as needed.  7. Zofran 400 mg every 6 hours as needed.  8.  Phenergan 25 mg every 8 hours as needed.   She has followup appointments:  1. At [facility] [employer], [address] [place], to pick up Holter      monitor.  They will call with the date to pick this up.  She will      see Dr. Graciela Husbands at the [facility] Monday,      May 31, 2007, at 1:45 p.m.  2. Additional followup with Dr. Kassie Mends at [facility] Thursday,      December 11, 3 p.m.  3. She is to check in with Dr. Despina Hidden because hematology recommends      stopping Celexa and starting another antidepressant because of      falling white count and overall anemia.  4. Follow up with Dr. Juanetta Gosling as needed.   LABORATORY STUDIES THIS ADMISSION:  Hepatitis C antibody is negative,  hepatitis B surface antigen negative.  Tissue transglutaminase 120 -  this is a high value, greater than 10 is a positive study.  HIV antibody  nonreactive.  Complete blood count December 3:  White cells of 3.3,  hemoglobin 10.4, hematocrit 28.7, and platelets of 177.  L-folate 5.4,  indeterminate, but certainly not deficient.  Blood ferritin is 106 -  within normal limits.  Iron and total iron:  Iron is 48, TIBC is 325,  percent saturation of 15, vitamin B12 is 727 - within normal limits.  Cardiac enzymes are 0.01, then 0.01.  Serum magnesium 1.9.  Basic  metabolic panel December 1:  Sodium 139, potassium 3.6, chloride 106,  carbonate 28,  glucose 88, BUN is 5, creatinine 0.59.  Reticulocyte  percentage of 1, rbc's 3.66, reticulocytes absolute 36.6.  Porphyrins  basically a normal study.  Angiotensin-converting enzyme has been  ordered but the results are not back.  Urine culture was negative, no  growth x5 days.  TSH this admission was  1.048.  Random cortisol 10.7.  This was a p.m. study and within normal  limits.  ESR was 5, within normal limits.  D-dimer 0.22.  Alkaline  phosphatase this admission of 45, SGOT is 26, SGPT is 30.  Serum  pregnancy was negative.  Lipase is 44, amylase is  95.  Chlamydia, RPR  negative.  Wet prep:  A few clue cells were seen.      Maple Mirza, PA      Maisie Fus C. Daleen Squibb, MD, [facility]  Electronically Signed    GM/MEDQ  D:  04/29/2007  T:  04/29/2007  Job:  [id number]   cc:   Ramon Dredge L. Juanetta Gosling, M.D.  Kassie Mends, M.D.  Iva Boop, MD,FACG  Lazaro Arms, M.D.  Duke Salvia, MD, [facility]

## 2010-10-08 NOTE — Assessment & Plan Note (Signed)
[facility]                         ELECTROPHYSIOLOGY OFFICE NOTE   Aybree, Lanyon ASHTAN LATON                       MRN:          [id number]  DATE:05/31/2007                            DOB:          01-17-1976    Ms. Simon comes in following a hospitalization for abdominal pain  during which time a recording of her heart had demonstrated wide complex  tachycardia that was felt to be probably ventricular origin. Cardiac  assessment included an echocardiogram that was normal.  She continues to  have spells of palpitations.  This is true notwithstanding the fact that  she is on metoprolol 50 mg twice daily.   She has intercurrently undergone surgery for laparoscopic lysis of  adhesions because of significant abdominal pain.  I thought I read that  there was an ovarian cyst, but as I look through the notes, I do not see  that.  I should also note that a cardiac evaluation included an MRI that  was normal.   PHYSICAL EXAMINATION:  VITAL SIGNS:  On examination today, her blood  pressure was 90/60.  Her pulse was 76.  LUNGS:  Clear.  CARDIAC:  Heart sounds were regular.  EXTREMITIES:  Without edema.  SKIN:  Warm and dry.   IMPRESSION:  1. Nonsustained wide complex tachycardia, probably ventricular in      origin.  2. Normal cardiac evaluation including an echocardiogram and MRI.  3. Ongoing problems with abdominal pain.  4. Sleep disturbance with very short hours of sleeping.  5. Depression.   Will plan to stop her beta-blocker, weaning off over 10 days, and begin  her on verapamil 120 mg a day.  We will plan to see her again in about  three months' time.     Duke Salvia, MD, [facility]  Electronically Signed    SCK/MedQ  DD: 05/31/2007  DT: 05/31/2007  Job #: [id number]   cc:   Ramon Dredge L. Juanetta Gosling, M.D.

## 2010-10-08 NOTE — Consult Note (Signed)
NAME:  Samantha [name], Samantha [name] NO.:  [id number]   MEDICAL RECORD NO.:  [id number]          PATIENT TYPE:  INP   LOCATION:  3707                         FACILITY:  [facility]   PHYSICIAN:  Iva Boop, MD,FACGDATE OF BIRTH:  [birth year]   DATE OF CONSULTATION:  04/24/2007  DATE OF DISCHARGE:                                 CONSULTATION   REASON FOR CONSULTATION:  Nausea, vomiting, abdominal pain, abnormal CT  scan.   ASSESSMENT:  1. This is a 35 year old white woman with a 2-week history of nausea      and vomiting that has been persistent.  There has been CT scanning,      x-ray studies, an EGD at [facility].  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  The patient is obese and      does not really fit the typical profile for sprue, but that is      possible I suppose.  It does seem possible that the chronic      vomiting that she has had, may have caused some duodenal changes I      suppose.  The endoscopist, Dr. Cira Servant did think she had some      scalloped folds at the time of the endoscopy.  There was also mild      erythema in the body and antrum of the stomach and that was      negative for eosinophilic gastroenteritis.   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  I doubt it is clinically significant.   Overall despite the celiac sprue possibility, it seems most likely to me  that she has a functional disturbance, she is severely anxious over work  apparently and she vomits within 5 minutes of eating, which does not  really fit with any significant pathophysiology related to other  disorders in  my mind.  She does have right lower quadrant abdominal pain  which seems to be musculoskeletal in origin based upon physical exam  findings and I think there is an abdominal wall strain relating to the  severe vomiting.   PLAN:  1. Will have to check celiac antibodies. If they are negative, I think      that puts the diagnosis of celiac disease in question.  2. Would use IV Reglan running if you thought that was not a problem      with her syncope and QT interval etc.  3. We will check a KUB to try to evaluate this diverticular structure.  4. Heating pad to the abdominal wall and try to brace when coughing,      etc.  5. Depending upon how  things go, would possibly consider a psychiatry      consult given her severe anxiety.  However, we have some other      things to check first.   I am really surprised by the positive biopsies that suggest celiac  disease  I am not convinced this is the appropriate clinical scenario  for that, but it is certainly not impossible that she had sprue, even so  if she did I am not sure it is the cause of her acute syndrome.   HISTORY:  This 35 year old white woman was admitted to [facility] after multiple episodes of nausea, and vomiting, vomiting 8-10  times a day apparently.  There was no clear-cut precipitant.  Subsequent  to that she developed the abdominal pain.  She had been to the [facility] Emergency Room, she had been to the emergency room in [place] and  finally she was admitted.  She has been stressed out about work, but  that is not anything new.  It is clear that the nausea and vomiting came  before the abdominal pain at least from the history I get today.  She  denied dysphagia or heartburn.  There is no chronic abdominal pain or  irritable bowel syndrome history.  Workup has included CT scanning,  ultrasound of the pelvis which is unrevealing, she had the upper GI  series at [facility].  We do not have that.  While at [facility]. she   developed some syncopal episode and had some heart rhythm irregularity  that suggested ventricular tachycardia and she was transferred here.  She has been constipated for a couple of weeks now that she has been  hospitalized.  She says no bowel movement in 10 days.  There are no  visual disturbances.  She is having a flare of some headaches in years  past.  She had what she described as migraines but they had subsided and  the patient is having some headaches, it sounds like it started after  all this.  There are no obvious sick contacts or anything like that.  Laboratory workup has really been unrevealing as best I can tell.   MEDICATIONS:  Here:  1. Celexa 20 mg daily.  2. Zofran p.r.n.   ALLERGIES:  None known.   MEDICATIONS:  On admission to [facility] included:  1. Valium 5 mg three to four times a day p.r.n.  2. Vicodin 5/500 2-3 times a day as needed p.r.n.  3. Phenergan p.r.n. since the nausea and vomiting.  4. Celexa 20 mg daily.   SOCIAL HISTORY:  She does not smoke.  Rare alcohol.  She is a Psychologist, occupational at [employer] She lives with her boyfriend.  She has two  children ages 61 and 68. Her son had a liver transplant for  Alagille Syndrome at age 79.  He is doing well apparently.  Her boyfriend  is here with her now.   PAST MEDICAL HISTORY:  Chronic low back pain, ovarian cyst,  hysterectomy, cesarean section twice. She did have some gut dysfunction  after hysterectomy and was treated with laxatives without improved.  She  has had hernia repair three occasions for umbilical hernia,  tonsillectomy and appendectomy.   FAMILY HISTORY:  As above for the Alagille syndrome. Mother had peptic  ulcer disease.   REVIEW OF SYSTEMS:  As reflected in the medical history.  She has  chronic low back pain.  All other systems appear negative at  this time.  Note that she had a syncopal episode.  Dr. Juanetta Gosling thought it might have  been a vagal phenomenon.   PHYSICAL  EXAMINATION:  GENERAL:  Reveals an obese young white woman no  acute distress.  She does not appear overly anxious.  VITAL SIGNS:  Temperature 97.4, pulse 63, respirations 18, blood  pressure 109/77.  HEENT:  The eyes are anicteric.  NECK:  Supple.  No thyromegaly or mass.  CHEST:  Clear.  HEART:  S1, S2 with no murmurs or gallop.  ABDOMEN:  The abdomen is soft.  She is tender exquisitely in the right  mid and right lower quadrant which is much worse with abdominal wall  tension.  There are surgical scars from hernia repair and previous  surgeries, but I detect no hernia.  The groin is nontender.  LOWER EXTREMITIES:  Free of edema.  SKIN:  Warm, dry.  I see no rash.  NEUROLOGIC:  She appears alert and x3.  Appropriate affect overall.   LAB DATA:  CBC today shows hemoglobin 10.4, MCV 86, platelets 135.  White count 2.5.  BMP:  Sodium 141, potassium 3.6, chloride 109, CO2 28,  glucose 93, BUN 3, creatinine 0.71, calcium 8.2, magnesium is 2. albumin  3.4.  Transaminases bilirubin normal.  SED rate is five.  Random  cortisol was 10.7, one might expect that to be a little higher. TSH  1.048, CK-MB is unrevealing, HCG negative on multiple occasions amylase  and lipase normal. Pelvic workup with wet prep and chlamydia smear  negative.   ADDITIONAL NOTES:  We will check an anemia panel.  The pancytopenia is  interesting.  That could go along with celiac disease.  I think.  Further plans pending this.      Iva Boop, MD,FACG  Electronically Signed     CEG/MEDQ  D:  04/24/2007  T:  04/25/2007  Job:  [id number]   cc:   Ramon Dredge L. Juanetta Gosling, M.D.  Pricilla Riffle, MD, [facility]  Kassie Mends, M.D.

## 2010-10-08 NOTE — Assessment & Plan Note (Signed)
NAME:  Samantha [name], Samantha [name]                 CHART#:  [id number]   DATE:  07/20/2007                       DOB:  Mar 09, 1976   REFERRING PHYSICIAN:  Oneal Deputy. Juanetta Gosling, M.D.   PROBLEM LIST:  1. Chronic abdominal pain.  2. Celiac disease.  3. Adhesive disease.  4. Anxiety disorder.  5. History of supraventricular tachycardia.   SUBJECTIVE:  The patient is a 35 year old female who presented with  chronic abdominal pain.  Her celiac serologies were positive.  In  November of 2008 her tissue transglutaminase was 120.  Her duodenal  biopsies in November of 2008 also showed chronic duodenitis associated  with subtotal villus atrophy consistent with celiac sprue.  In December  of 2008 she underwent diagnostic laparoscopy with lysis of adhesions.   NO FURTHER DICTATION       Kassie Mends, M.D.  Electronically Signed     SM/MEDQ  D:  07/20/2007  T:  07/21/2007  Job:  [id number]

## 2010-10-08 NOTE — Op Note (Signed)
NAME:  Samantha [name], Samantha [name] NO.:  [id number]   MEDICAL RECORD NO.:  [id number]          PATIENT TYPE:  AMB   LOCATION:  DAY                           FACILITY:  [facility]   PHYSICIAN:  Tilda Burrow, M.D. DATE OF BIRTH:  04-Apr-1976   DATE OF PROCEDURE:  03/21/2008  DATE OF DISCHARGE:                               OPERATIVE REPORT   PREOPERATIVE DIAGNOSES:  1. Left ovarian cyst.  2. Chronic pelvic pain.   PROCEDURE:  1. Laparotomy.  2. Left salpingo-oophorectomy.  3. Abdominal wall scar revision.   SURGEON:  Tilda Burrow, MD   ASSISTANT:  Amie Critchley, CST, Kendrick RN   ANESTHESIA:  Thomas Hoff, CRNA   COMPLICATIONS:  None.   FINDINGS:  Small ovarian cyst, left ovary, extensive adhesions from the  left ovary to pelvic sidewall to side of sigmoid colon and to pelvic  structures, placing the ovary on obvious tension, evidence of prior mesh  repair of midline ventral hernia.   DETAILS OF PROCEDURE:  The patient was taken to the operating room,  prepped and draped for lower abdominal surgery with a Foley catheter in  place.  Time-out was conducted and confirmed.  IV antibiotics  administered.  Pfannenstiel-type incision was repeated with wide  excision to remove the deep fibrotic crease that had developed with her  body habitus combined with 3 laparotomy surgeries.  An ellipse of skin  approximately 8 cm wide x 40 cm in length was trimmed, removing the skin  and subcu fatty tissue leaving a thick layer of fatty tissue over the  fascia.  Pfannenstiel-type incision was then performed and opened  elevating the fascia.  Midline was easily identified.  A series of  interrupted 2-0 Prolene sutures indicating where she had prior closure  in the midline.  This has healed very nicely.  Midline was opened  without difficulty and essentially minimal adhesions encountered on the  anterior surface of the abdominal wall.  The bowel could be elevated out  of the pelvis and  there were some adhesions from the sigmoid colon to  the side of the ovary, which was attached to the pelvic sidewall and  below the pelvic brim.  Sharp dissection allowed the epiploic fatty  tissue attachments to the ovary to be released and the bowel packing  away could be completed.  Babcock clamp could be then placed on the  ovary and re-establishment of normal anatomy.  Fatty attachments to the  sidewall were released.  Lateral to the infundibulopelvic ligament, a  retroperitoneal opening was performed and the IP ligament isolated.  The  ureter was identified by palpation well away from the IP ligament on the  left.  IP ligament was then clamped, cut, and suture ligated.  The tube  and ovary were then carefully peeled off the pelvic sidewall.  Some  round ligament attachments to the ovary were identified, isolated, and  transected with a Kelly clamp placed on a small portion of this  dissection.  Hemostasis was satisfactory at the end of the case, at no  time was there suspicion of injury  or risk to the ureter.  Attention was  then directed to the right side where the right ovary was identified and  found essentially to be normal and well above the pelvic brim in its  positioning.  It had sufficient mobility that it was felt that leaving  the normal-appearing right tube and right ovary would be reasonable.  This was left as per the patient's preoperative request.  Inspection of  the pelvis was performed, irrigation performed, and then the re-closure  of the abdominal wall performed.  A 0-Prolene interrupted sutures were  used to replace the sutures disrupted on the way in at the level of the  rectus muscles and the posterior sheath.  The transverse Pfannenstiel  incision was then closed using continuous running 0-Vicryl with good  skin edge approximation.   Subcutaneous tissues were mobilized on the inferior margin approximately  2 cm above the fascia to allow improved contouring of  the lower abdomen.  Series of interrupted 2-0 plain sutures were used to pull the fatty  tissue edges into reasonable approximation.  Another layer of the flap  horizontal interrupted 2-0 plain sutures were placed in the subcu fatty  tissue to reapproximate skin edges where staple closure of the skin  could be performed.  Prior to the second layer of skin sutures, we were  able to place a flat 10-mm subcu Jackson-Pratt drain in the depths of  the fatty tissue at the level of Scarpa's fascia, and then staple  closure of skin completed the procedure.  The drain was allowed to exit  through the right corner of the incision and sutured in place and placed  to continuous low suction.  The patient tolerated procedure well,  awakened quickly with low pain tolerance as expected, and will go to  recovery room in stable condition.      Tilda Burrow, M.D.  Electronically Signed     JVF/MEDQ  D:  03/21/2008  T:  03/21/2008  Job:  [id number]   cc:   [facility]   Oneal Deputy. Juanetta Gosling, M.D.  Fax: [phone]

## 2010-10-08 NOTE — Group Therapy Note (Signed)
NAME:  OKEMA, ROLLINSON NO.:  [id number]   MEDICAL RECORD NO.:  [id number]          PATIENT TYPE:  INP   LOCATION:  IC08                          FACILITY:  [facility]   PHYSICIAN:  Edward L. Juanetta Gosling, M.D.DATE OF BIRTH:  March 21, 1976   DATE OF PROCEDURE:  DATE OF DISCHARGE:                                 PROGRESS NOTE   Ms. Rodocker was being brought back from ultrasound and had an episode  where she passed out.  Apparently she stop breathing at least briefly.  She was bagged.  She was almost immediately improved but has been having  episodes of decreased level of consciousness.  She had no response to  Narcan,  she had received 0.5 mg of Dilaudid about 2 hours prior to the  episode.  She is complaining of intense right lower quadrant pain now  and it is not clear what the etiology is.  I have discussed the  situation with her boyfriend, Susy Frizzle, who says that she was told at  [facility] that she might have a tubal pregnancy but at this  point her pelvic ultrasound was negative so I think that is unlikely.  However I am going to have her go ahead and get a serum pregnancy test  to be sure.  I am repeating lab work.  I am going to have her get  another CT abdomen and pelvis.  I want to discuss her situation with Dr.  Pecola Leisure.  I am going to go and ask for surgical consultation as well.  Dr. Despina Hidden has seen her and was awaiting the pelvic ultrasound.  At this  point it is not clear why she is having such intense abdominal pain.  I  think that probably what happened to her post ultrasound is that she had  a vagal reaction from that.  She said she has passed out twice in the  last 2 weeks at work so I suspect that is the etiology.  At this point I  am going to go ahead and put on O2, check blood cultures, check a urine  culture, have her get a liter of saline, increase her IV fluids.  She is  on oxygen.  Her blood gas is normal.  She does not have an elevated CO2  so she does  not have any CO2 narcosis and as mentioned she had  absolutely no response to Narcan.  I have asked for GI consultation.  I  am going to get the surgical consultation.  She is already being seen by  OB/GYN.      Edward L. Juanetta Gosling, M.D.  Electronically Signed     ELH/MEDQ  D:  04/20/2007  T:  04/20/2007  Job:  [id number]

## 2010-10-08 NOTE — Group Therapy Note (Signed)
NAME:  Samantha, [name] NO.:  [id number]   MEDICAL RECORD NO.:  [id number]          PATIENT TYPE:  INP   LOCATION:  A225                          FACILITY:  [facility]   PHYSICIAN:  Edward L. Juanetta Gosling, M.D.DATE OF BIRTH:  1976-04-28   DATE OF PROCEDURE:  05/12/2007  DATE OF DISCHARGE:                                 PROGRESS NOTE   Samantha [name] may be a little better this morning.  She is still having  some abdominal discomfort but it seems a little less and less in the  left lower quadrant than before.  She said that she did vomit last  night.   Her exam shows a temperature of 97, pulse 73, respirations 20, blood  pressure 106/60, O2 sat's 97% on room air.  Her urine for  porphobilinogen is still pending.  Her exam shows her abdomen is soft.  Her chest is clear.  She looks a little more comfortable.   PLAN:  Continue postop recovery.  Try to get her up and moving around  some.      Edward L. Juanetta Gosling, M.D.  Electronically Signed     ELH/MEDQ  D:  05/12/2007  T:  05/12/2007  Job:  [id number]

## 2010-10-08 NOTE — Group Therapy Note (Signed)
NAME:  Samantha [name], Samantha [name] NO.:  [id number]   MEDICAL RECORD NO.:  [id number]          PATIENT TYPE:  INP   LOCATION:  A227                          FACILITY:  [facility]   PHYSICIAN:  Edward L. Juanetta Gosling, M.D.DATE OF BIRTH:  03-May-1976   DATE OF PROCEDURE:  DATE OF DISCHARGE:                                 PROGRESS NOTE   SUBJECTIVE:  Samantha [name] is much improved.  She says her abdominal pain  is better.  She is not as nauseated.  She has no new complaints.  She  wants to get her Foley catheter out.  Her NG tube came out yesterday.  She wants to walk some, all of which I think is appropriate.   Her exam today shows her temperature is 98.3, pulse 74, respirations 20,  blood pressure 123/71.  O2 sat is 99% on room air.  Her chest is clear.  The heart is regular.  Abdomen:  Soft.   ASSESSMENT:  She looks much better.   PLAN:  Discontinue the Foley catheter.  Try to get her up and moving  around.  She is set for a gastric emptying study.  Depending on the  results of that, she is so much improved, she might be able to be  discharged after this is done.      Edward L. Juanetta Gosling, M.D.  Electronically Signed     ELH/MEDQ  D:  04/22/2007  T:  04/22/2007  Job:  [id number]

## 2010-10-08 NOTE — H&P (Signed)
NAME:  Samantha [name], Samantha [name] NO.:  [id number]   MEDICAL RECORD NO.:  [id number]          PATIENT TYPE:  INP   LOCATION:  A319                          FACILITY:  [facility]   PHYSICIAN:  Edward L. Juanetta Gosling, M.D.DATE OF BIRTH:  07-05-1975   DATE OF ADMISSION:  04/19/2007  DATE OF DISCHARGE:  LH                              HISTORY & PHYSICAL   [name] is a 35 year old who came to the emergency room with  abdominal pain.  She says she has had this for about 2 weeks.  She has  had multiple workups including 4 or 5 different emergency room visits.  Came back to the emergency room here at [facility] today.  Dr. Neale Burly  did a pelvic examination and found that she was exquisitely tender in  the pelvis without any definite mass, etc.  She has had vomiting, and  she has actually had a syncopal episode as well.  She has had multiple  evaluations including multiple CTs but has not found an etiology.   Her past medical history is positive for chronic low back pain, ovarian  cysts.  She has had a hysterectomy because of bleeding post delivery,  and she was said to have gestational diabetes.  She had a C-section,  hernia repair, hysterectomy, tonsillectomy, adenoidectomy.   SOCIAL HISTORY:  She does not smoke.  She does not drink any alcohol.  She does not use any illicit drugs.  She has been working but has not  able to work because of her abdominal discomfort.   Her medications are:  1. Valium 5 mg 3 to 4 times a day as needed for anxiety.  2. Vicodin 5/500 two to three times a day as needed for pain.  3. Celexa 20 mg daily.  4. She is been on Phenergan p.r.n. since she has had the abdominal and      pelvic discomfort.   PHYSICAL EXAMINATION:  Shows that she does appear to be uncomfortable.  Her blood pressure is 120/70, pulse is 80, respirations 18, she is  afebrile.  Her pupils are reactive to light and accommodation.  Nose and throat are  clear.  Her neck is supple without  masses.  CHEST:  Clear.  HEART:  Is regular.  Her abdomen with diffuse tenderness.  GENITALS:  Per Dr. Neale Burly.  I did not repeat a pelvic examination.  NEUROLOGICAL EXAMINATION:  Is normal.   LABORATORY WORK:  Lipase 44, amylase 95, both normal.  Comprehensive  metabolic profile normal.  White count 4200, hemoglobin is 13.9.  In the  emergency room, she had chlamydia and gonorrheal probe which were  negative, a wet prep which showed a few clue cells.   ASSESSMENT:  She has got abdominal discomfort, nausea and vomiting and  severe pelvic pain on pelvic examination, all of this of an unknown  cause.  I am going to give her IV fluids, ask for consultation with a  gynecologist.  I am going to go ahead and start her on some antibiotics,  although it is not totally clear that she has any infection.  She got  something going on that has been going on for at least 2 weeks and is  associated with pelvic pain.      Edward L. Juanetta Gosling, M.D.  Electronically Signed     ELH/MEDQ  D:  04/19/2007  T:  04/20/2007  Job:  [id number]

## 2010-10-08 NOTE — Group Therapy Note (Signed)
NAME:  SUNDY, HOUCHINS NO.:  [id number]   MEDICAL RECORD NO.:  [id number]          PATIENT TYPE:  INP   LOCATION:  A225                          FACILITY:  [facility]   PHYSICIAN:  Edward L. Juanetta Gosling, M.D.DATE OF BIRTH:  1975/09/27   DATE OF PROCEDURE:  05/08/2007  DATE OF DISCHARGE:                                 PROGRESS NOTE   Ms. Mackowski is still having significant abdominal pain, says she has not  had any bowel movement yet.  She is otherwise about the same.  She did  have small-bowel follow-through which showed somewhat slowed transit but  nothing else.   Her physical examination today shows her temperature is 98.6, pulse 61,  respirations 14, blood pressure 120/64, O2Sat 96%.  Her abdomen is a bit  softer. Her lab work shows potassium is 3.5.  Electrolytes normal.  CBC  shows white count 3700, hemoglobin 11.4, platelets 210.   ASSESSMENT AND PLAN:  I am going to see if she can take a enema today,  and I am going to go ahead and add some Reglan and just to see if it  makes any difference.  No other new treatments now.      Edward L. Juanetta Gosling, M.D.  Electronically Signed     ELH/MEDQ  D:  05/08/2007  T:  05/09/2007  Job:  [id number]

## 2010-10-08 NOTE — Discharge Summary (Signed)
NAME:  Samantha [name], Samantha [name] NO.:  [id number]   MEDICAL RECORD NO.:  [id number]          PATIENT TYPE:  INP   LOCATION:  A305                          FACILITY:  [facility]   PHYSICIAN:  Tilda Burrow, M.D. DATE OF BIRTH:  Jul 03, 1975   DATE OF ADMISSION:  03/21/2008  DATE OF DISCHARGE:  10/29/2009LH                               DISCHARGE SUMMARY   ADMITTING DIAGNOSES:  Chronic left lower quadrant pain, left ovarian  cyst.   DISCHARGE DIAGNOSES:  Chronic left lower quadrant pain due to left  ovarian adhesions, abdominal wall fibrosis.   PROCEDURE:  On March 21, 2008, laparotomy, left salpingo-oophorectomy,  abdominal wall scar revision.   DISCHARGE MEDICATIONS:  1. Percocet 5/325, #40 tablets one p.o. q.4 h. p.r.n. pain.  2. Stool softener of choice daily.   Follow up postop day #7 for Jackson-Pratt drain, subcutaneous JP drain  removal in 4 weeks.   HOSPITAL SUMMARY:  This 35 year old female was admitted for laparotomy  and removal of left tube and ovary for suspected adhesions and ovarian  source of her pelvic pain.  The patient was indeed found to have a  densely adherent left tube and ovary.  This was performed through a  Pfannenstiel incision with a wide excision of a cicatrix which removed a  26 x 8 x 3.5 cm ellipse of skin and underlying fatty tissue which was  benign.  The tube and ovary were removed on the left.  The right ovary  was fine.  The left tube and ovary's pathology report showed benign  follicular cysts.  Postoperatively, the patient did well, required IV  pain pump for 2 days as expected and was discharged home with incision  looking fine on postop day #2 for followup, 5 days later for drain  removal.  Routine postsurgical instructions regarding notification of  temperature over 100.6 to [facility]  Follow up postop day #7 for  incision check and JP drain removal.       Tilda Burrow, M.D.  Electronically Signed     JVF/MEDQ  D:   03/23/2008  T:  03/23/2008  Job:  [id number]

## 2010-10-11 ENCOUNTER — Emergency Department (HOSPITAL_COMMUNITY)
Admission: EM | Admit: 2010-10-11 | Discharge: 2010-10-11 | Disposition: A | Discharge status: Home / Self Care | Payer: Medicaid Other | Attending: Emergency Medicine | Admitting: Emergency Medicine

## 2010-10-11 DIAGNOSIS — R109 Unspecified abdominal pain: Secondary | ICD-10-CM | POA: Insufficient documentation

## 2010-10-11 LAB — URINALYSIS, ROUTINE W REFLEX MICROSCOPIC
Leukocytes, UA: NEGATIVE
Nitrite: NEGATIVE
Specific Gravity, Urine: >1.03 — ABNORMAL HIGH (ref 1.005–1.030)
Urobilinogen, UA: 0.2 mg/dL (ref 0.0–1.0)

## 2010-10-11 LAB — DIFFERENTIAL
Basophils Absolute: 0 10*3/uL (ref 0.0–0.1)
Eosinophils Relative: 1 % (ref 0–5)
Lymphocytes Relative: 38 % (ref 12–46)
Monocytes Absolute: 0.5 10*3/uL (ref 0.1–1.0)

## 2010-10-11 LAB — CBC
HCT: 31.3 % — ABNORMAL LOW (ref 36.0–46.0)
Platelets: 164 10*3/uL (ref 150–400)
RDW: 13.3 % (ref 11.5–15.5)
WBC: 3.9 10*3/uL — ABNORMAL LOW (ref 4.0–10.5)

## 2010-10-11 LAB — BASIC METABOLIC PANEL
CO2: 31 mEq/L (ref 19–32)
Calcium: 9.2 mg/dL (ref 8.4–10.5)
Glucose, Bld: 81 mg/dL (ref 70–99)
Sodium: 139 mEq/L (ref 135–145)

## 2010-10-11 LAB — URINE MICROSCOPIC-ADD ON

## 2010-10-11 NOTE — H&P (Signed)
NAME:  MISHEL, SANS NO.:  [id number]   MEDICAL RECORD NO.:  [id number]          PATIENT TYPE:  AMB   LOCATION:                                FACILITY:  [facility]   PHYSICIAN:  Dalia Heading, M.D.  DATE OF BIRTH:  1975-12-30   DATE OF ADMISSION:  DATE OF DISCHARGE:  LH                                HISTORY & PHYSICAL   CHIEF COMPLAINT:  Incisional hernia.   HISTORY OF PRESENT ILLNESS:  The patient is a 35 year old white female  status post an umbilical herniorrhaphy in the past who now presents with  periumbilical pain extending to the suprapubic region. She has also had  intermittent episodes of pain along a Pfannenstiel incision done in the past  for hysterectomy. No hernia has been noted at that point.   PAST MEDICAL HISTORY:  Anxiety.   PAST SURGICAL HISTORY:  1.  Appendectomy.  2.  Tonsillectomy.  3.  Multiple Cesarean sections.  4.  Umbilical herniorrhaphy.  5.  Hysterectomy.   CURRENT MEDICATIONS:  Celexa 20 mg p.o. daily.   ALLERGIES:  No known drug allergies.   REVIEW OF SYSTEMS:  Noncontributory.   PHYSICAL EXAMINATION:  GENERAL:  The patient is a well-developed, well-  nourished white female in no acute distress.  LUNGS:  Clear to auscultation with equal breath sounds bilaterally.  HEART:  Reveals a regular rate and rhythm without S3, S4, or murmurs.  ABDOMEN:  Soft, tender at the base of the umbilicus and long the  Pfannenstiel incision. A small umbilical hernia is noted. No low transverse  hernias are noted. No masses are noted.   IMPRESSION:  Incision hernia, periumbilical.   PLAN:  The patient is scheduled for an incisional herniorrhaphy on March 21, 2005. The risks and benefits of the procedure including bleeding,  infection, and the possibility of recurrence of the hernia or pain were  fully explained to the patient who gave informed consent.      Dalia Heading, M.D.  Electronically Signed     MAJ/MEDQ  D:  03/18/2005   T:  03/18/2005  Job:  [id number]   cc:   [facility]  Fax: [phone]   Tilda Burrow, M.D.  Fax: [phone]   Oneal Deputy. Juanetta Gosling, M.D.  Fax: [phone]

## 2010-10-11 NOTE — Procedures (Signed)
NAME:  Samantha [name], Samantha [name] NO.:  [id number]   MEDICAL RECORD NO.:  [id number]          PATIENT TYPE:  OUT   LOCATION:  SLEEP LAB                     FACILITY:  [facility]   PHYSICIAN:  Barbaraann Share, MD,FCCPDATE OF BIRTH:  1975/12/09   DATE OF STUDY:                            NOCTURNAL POLYSOMNOGRAM   REFERRING PHYSICIAN:   REFERRING PHYSICIAN:  Aundria Mems, MD, [place]   INDICATION FOR THE STUDY:  Hypersomnia with sleep apnea.   EPWORTH SCORE:  18.   SLEEP ARCHITECTURE:  The patient had a total sleep time of 285 minutes  with decreased slow wave sleep as well as REM.  Sleep onset latency was  normal at 14 minutes, and REM onset was prolonged at 167 minutes.  Sleep  efficiency was poor at 78%.   RESPIRATORY DATA:  The patient was found to have 10 obstructive  hypopneas and no apneas for an apnea-hypopnea index of only 2 events per  hour.  There was very loud snoring noted throughout.  It should also be  noted the events occur primarily in the supine position.  The patient  did not meet split night protocol secondary to the small numbers of  events.   OXYGEN DATA:  The patient had O2 desaturation transiently as low as 88%  during the night, however, only spent 12 seconds; less than 90% of the  entire study.   CARDIAC DATA:  No clinically significant arrhythmias were seen.   MOVEMENT-PARASOMNIA:  The patient was found to have 122 leg jerks  primarily during the beginning of the study, however, only one of these  resulted in arousal the whole night.  This does not appear to be  clinically significant.   IMPRESSION-RECOMMENDATIONS:  Small numbers of obstructive events, which  do not meet the apnea-hypopnea index criteria for the obstructive sleep  apnea syndrome.  The patient did have very loud snoring, and the  events that occurred did so primarily in the supine position.  Treatment  for this could include staying out of the supine position while  sleeping, and  also working aggressively on weight loss.      Barbaraann Share, MD,FCCP  Diplomate, American Board of Sleep  Medicine  Electronically Signed     KMC/MEDQ  D:  07/06/2008 16:10:96  T:  07/06/2008 08:40:11  Job:  [id number]

## 2010-10-11 NOTE — Op Note (Signed)
NAME:  [name], [name] NO.:  [id number]   MEDICAL RECORD NO.:  [id number]          PATIENT TYPE:  AMB   LOCATION:  DAY                           FACILITY:  [facility]   PHYSICIAN:  Dalia Heading, M.D.  DATE OF BIRTH:  1976/05/19   DATE OF PROCEDURE:  03/21/2005  DATE OF DISCHARGE:                                 OPERATIVE REPORT   PREOPERATIVE DIAGNOSIS:  Incisional hernia.   POSTOPERATIVE DIAGNOSIS:  Incisional hernia.   PROCEDURE:  Incisional herniorrhaphy.   SURGEON:  Dr. Franky Macho.   ANESTHESIA:  General.   INDICATIONS:  The patient is a 35 year old white female who has had a  previous umbilical herniorrhaphy and now presents with an umbilical hernia.  This ends up being incisional in nature as it was previously repaired. Risks  and benefits of the procedure including bleeding, infection, recurrence of  the pain, and recurrence of the hernia were fully explained to the patient,  who gave informed consent.   PROCEDURE NOTE:  The patient was placed in supine position. After general  anesthesia was administered, the umbilicus was prepped and draped using the  usual sterile technique with Betadine. Surgical site confirmation was  performed.   An infraumbilical incision was made down to fascia. The umbilicus was freed  away from the underlying fascia. The patient was noted to have an incisional  hernia. It was small in nature. Granulomatous tissue was present and this  was excised without difficulty. Several old sutures were also removed. The  fascia was reapproximated transversely using 0 Surgidac interrupted sutures.  The base the umbilicus was secured back to the fascia using a 2-0 Vicryl  figure-of-eight suture. Subcutaneous layer was reapproximated in 3-0 Vicryl  interrupted suture. The skin was closed using staples, 0.5% Sensorcaine was  instilled to the surrounding wound. Betadine ointment and dry sterile  dressing were applied.   All tape and  needle counts were correct at the end of the procedure. The  patient was awakened and transferred to PACU in stable condition.   COMPLICATIONS:  None.   SPECIMEN:  None.   BLOOD LOSS:  Minimal.      Dalia Heading, M.D.  Electronically Signed     MAJ/MEDQ  D:  03/21/2005  T:  03/21/2005  Job:  [id number]   cc:   Tilda Burrow, M.D.  Fax: [phone]   Oneal Deputy. Juanetta Gosling, M.D.  Fax: [phone]

## 2010-10-11 NOTE — Op Note (Signed)
[facility]  Patient:    Samantha [name], Samantha [name] Visit Number: [id number] MRN: [id number]          Service Type: OBS Location: 4A A417 01 Attending Physician:  Tilda Burrow Dictated by:   Christin Bach, M.D. Proc. Date: 06/21/01 Admit Date:  06/21/2001   CC:         Dr. Alvino Chapel of [facility]   Operative Report  PREOPERATIVE DIAGNOSIS:  Pregnancy, 39-1/[redacted] weeks gestation, repeat cesarean section, not for trial of labor, elective permanent sterilization.  POSTOPERATIVE DIAGNOSIS:  Pregnancy, 39-1/[redacted] weeks gestation for repeat cesarean section, not for trial of labor, elective permanent sterilization.  PROCEDURE:  Repeat low transverse cervical cesarean section, bilateral partial salpingectomy.  SURGEON:  Christin Bach, M.D.  ASSISTANTAsencion Noble, C.S.T.  ANESTHESIA:  Spinal.  COMPLICATIONS:  None.  FINDINGS:  Milky, turbid amniotic fluid without malodor sent for Gram stain and culture.  The patient was afebrile and infant did well.  INDICATIONS:  A 35 year old female with prior cesarean section for cephalopelvic disproportion with high presenting part, unfavorable cervix, considered not a candidate for vaginal birth after cesarean section.  DESCRIPTION OF PROCEDURE:  The patient was taken to the operating room, prepped and draped in the usual fashion for lower abdominal surgery. Pfannenstiel-type incision was repeated with some firm, fibrous tissue holding the rectus muscles together, requiring splitting.  The bowel would tend to get in the way, slipping in front of the uterus but was dealt with with two laparotomy tapes placed in the abdomen.  Bladder flap was developed on the lower uterine segment, transverse incision made.  The lower uterine segment was extremely thin, 2-3 mm maximum thickness, at its weak spot.  Transverse incision was followed by guidance of the vertex into the incision, delivery by vacuum extraction  guidance combined with fundal pressure.  The infant then progressed to deliver the infant easily.  The amniotic fluid was discolored, turbid and was cultured as described earlier.  The cord was clamped on the infant.  The cord was unusually long, probably 100 cm in length.  The infant was passed to the waiting pediatrician, Dr. Alvino Chapel, who cared for the baby and will dictate her notes elsewhere.  Placenta delivered easily, _____ presentation, membranes intact and was sent for histology.  The uterus was irrigated generously with antibiotic-containing solution and then closed using single layer of running locking of 0 chromic. The bladder flap was then closed using 2-0 chromic.  Abdomen was irrigated and tubal ligation performed.  Tubal ligation:  Tubal ligation was then performed using Babcock clamp to grasp the mid segment knuckle of tube, elevated and doubly ligated beneath the fallopian tube with a mid segment knuckle of tube then excised after having been tied off.  Hemostasis was confirmed.  The opposite tube was treated similarly.  The abdomen then was irrigated with antibiotic solution, closed with some difficulty using a "fish" laparotomy closure assistant barrier to hold the bowel in place and then the "fish" was removed at the last moment before the peritoneum was completely closed.  The sponge and needle counts were correct. Fascia was closed using 0 Vicryl and then the subcutaneous tissues irrigated again with antibiotic solution, closed using single layer of continuous running 2-0 plain and then staple closure of the skin.  Estimated blood loss 450 cc.  cc:  Dr. Alvino Chapel of [facility]. Dictated by:   Christin Bach, M.D. Attending Physician:  Tilda Burrow DD:  06/21/01 TD:  06/21/01  Job: [id number]

## 2010-10-11 NOTE — Discharge Summary (Signed)
NAME:  Samantha [name], Samantha [name] NO.:  [id number]   MEDICAL RECORD NO.:  [id number]          PATIENT TYPE:  INP   LOCATION:  A427                          FACILITY:  [facility]   PHYSICIAN:  Lazaro Arms, M.D.   DATE OF BIRTH:  [birth year]   DATE OF ADMISSION:  08/16/2004  DATE OF DISCHARGE:  03/28/2006LH                                 DISCHARGE SUMMARY   DISCHARGE DIAGNOSES:  1.  Postoperative nausea and vomiting, questionable ileus versus      questionable viral etiology.  2.  No evidence of ileus by any of her x-ray or computed tomography studies.  3.  Consistent with viral etiology by lab work.   HISTORY OF PRESENT ILLNESS:  Please refer to the transcribed History and  Physical for details of admission to hospital.   HOSPITAL COURSE:  Samantha [name] was admitted basically 1 week postop from abdominal  hysterectomy because of nausea and vomiting that could not be controlled as  an outpatient.  She was also having very hypoactive bowel function.  Her  abdominal exam was soft.  She had hypoactive bowel sounds and mild  distention.  She does have a lot on her at home with a son who has had a  liver transplant, so I decided to admit her for inpatient management since  she failed outpatient.  Acute abdominal series were unremarkable for ileus.  There were no air fluid levels certainly not consistent with a bowel  obstruction.  Subsequently, we got a CT scan which also came back normal  showing no pelvic hematoma or other intraperitoneal process.  Interestingly,  her white blood cell count at the time of admission was quite low at 3.7  with 46 lymphs which is most consistent with a viral illness.  Her white  count increased to 4.1, but again a significant shift of lymphs.  Prior to  discharge, she was able to keep down clear liquids and some soft mechanical  food and she had only thrown up one time in the 24 hours prior.  She had  normal bowel sounds.  Her abdomen was soft and was not  distended and again  she tolerated the contrast of the CT scan.  As a result, she was discharged  to home.   DISCHARGE MEDICATIONS:  1.  Reglan.  2.  Phenergan.  3.  Prevacid.  4.  Lorcet plus for pain.   FOLLOW UP:  We will see her back in the office next week for followup.   CONDITION ON DISCHARGE:  Her incision was clean, dry and intact prior to  discharge.      LHE/MEDQ  D:  08/20/2004  T:  08/20/2004  Job:  [id number]

## 2010-10-11 NOTE — Op Note (Signed)
NAME:  Samantha [name], LACAP NO.:  [id number]   MEDICAL RECORD NO.:  [id number]          PATIENT TYPE:  AMB   LOCATION:  DAY                           FACILITY:  [facility]   PHYSICIAN:  Lazaro Arms, M.D.   DATE OF BIRTH:  1975/06/15   DATE OF PROCEDURE:  04/04/2004  DATE OF DISCHARGE:                                 OPERATIVE REPORT   PREOPERATIVE DIAGNOSES:  1.  Dysfunctional uterine bleeding, unresponsive to conservative measures.  2.  Dysmenorrhea.   POSTOPERATIVE DIAGNOSES:  1.  Dysfunctional uterine bleeding, unresponsive to conservative measures.  2.  Dysmenorrhea.   PROCEDURE:  Hysterectomy dilation and curettage with a hydrothermal  endometrial ablation.   SURGEON:  Dr. Despina Hidden.   ANESTHESIA:  Laryngeal mask airway.   FINDINGS:  The patient had basically what appeared to be a normal  endometrial cavity. There were no polyps, no fibroids, no abnormalities.   DESCRIPTION OF OPERATION:  The patient was taken to the operating room and  placed in a supine position where she underwent laryngeal mask airway. She  was placed in dorsal lithotomy position, prepped and draped in usual sterile  fashion. A Grave's speculum was placed. A paracervical block was placed  using 0.5% Marcaine with 1 to 200,000 epinephrine, 10 cc on either side. The  uterus sounded to 9 cm. The cervix was dilated serially to allow passage of  the HydroThermAblator hysteroscope. Endometrial cavity was distended with  normal saline and was found to be normal. Vigorous uterine curettage was  performed with good uterine cry in all areas. The hydrothermal ablation was  then performed for heating cycle of 3 minutes, a treatment cycle of 10  minutes. No fluid was lost at all during the procedure. The uterine cavity  was then cooled down, and it was significantly blanched. The patient  tolerated the procedure well. She was awaken from anesthesia in good and  stable condition. Blood loss was minimal. She  received Ancef and Toradol  prophylactically. All specimens went to the lab.     Luth   LHE/MEDQ  D:  04/04/2004  T:  04/04/2004  Job:  [id number]

## 2010-10-11 NOTE — Op Note (Signed)
NAME:  Samantha [name], Samantha [name] NO.:  [id number]   MEDICAL RECORD NO.:  [id number]          PATIENT TYPE:  AMB   LOCATION:  DAY                           FACILITY:  [facility]   PHYSICIAN:  Lazaro Arms, M.D.   DATE OF BIRTH:  [birth year]   DATE OF PROCEDURE:  08/08/2004  DATE OF DISCHARGE:                                 OPERATIVE REPORT   PREOPERATIVE DIAGNOSES:  1.  Menometrorrhagia.  2.  Dysmenorrhea.  3.  Status post endometrial ablation last fall, but she has had what would      be considered a failure to her ongoing bleeding.   POSTOPERATIVE DIAGNOSES:  1.  Menometrorrhagia.  2.  Dysmenorrhea.  3.  Status post endometrial ablation last fall, but she has had what would      be considered a failure to her ongoing bleeding.   PROCEDURE:  Abdominal hysterectomy.   SURGEON:  Lazaro Arms, M.D.   ANESTHESIA:  General endotracheal.   FINDINGS:  The patient had normal uterus, tubes, and ovaries.  The uterus  appeared completely normal.  There was no endometriosis, there were no  fibroids, completely normal.  The peritoneal cavity was normal.  There was  no adhesive disease.   DESCRIPTION OF OPERATION:  The patient was taken to the operating room and  placed in the supine position, where she underwent general endotracheal  anesthesia.  She was then prepped and draped in the usual sterile fashion.  A Balfour self-retaining retractor was placed.  The upper abdomen was packed  away.  The uterus was grasped at the cornua, the round ligaments suture  ligated and cut.  The utero-ovarian ligaments were doubly suture ligated and  cut.  The uterine vessels were clamped, cut, transfixed and suture ligated.  The bladder was pushed off the lower uterine segment bilaterally.  The  cardinal ligaments were clamped, cut, transfixed and suture ligated.  The  vagina was crossclamped.  Vaginal angle sutures were placed.  The uterus and  cervix were removed.  The vagina was closed with  interrupted figure-of-eight  sutures.  The peritoneal cavity was irrigated vigorously.  There was good  hemostasis of all pedicles.  Interceed was placed on the vaginal cuff to try  to prevent postoperative adhesions.  The ovaries were attached to the round  ligament stumps to keep the ovaries off the vaginal cuff in the future.  The  patient tolerated the procedure well.  She experienced 300 mL of blood loss.  The Terressa Koyanagi was removed, packs were removed, all counts were correct.  The  peritoneum and muscle was reapproximated loosely, the fascia closed using 0  Vicryl running, the  subcutaneous tissues made hemostatic and irrigated.  The skin was closed  using skin staples.  The patient tolerated the procedure well.  She  experienced 300 mL of blood loss, taken to recovery in stable condition.  All counts correct x3.      LHE/MEDQ  D:  08/08/2004  T:  08/08/2004  Job:  [id number]

## 2010-10-11 NOTE — Op Note (Signed)
NAME:  Samantha [name], Samantha [name] NO.:  [id number]   MEDICAL RECORD NO.:  [id number]          PATIENT TYPE:  OBV   LOCATION:  A427                          FACILITY:  [facility]   PHYSICIAN:  Dalia Heading, M.D.  DATE OF BIRTH:  08-Dec-1975   DATE OF PROCEDURE:  09/01/2005  DATE OF DISCHARGE:                                 OPERATIVE REPORT   PREOPERATIVE DIAGNOSIS:  Incisional hernia.   POSTOPERATIVE DIAGNOSIS:  Incisional hernia.   PROCEDURE:  Incisional herniorrhaphy with mesh.   SURGEON:  Dr. Franky Macho   ANESTHESIA:  General endotracheal.   INDICATIONS:  The patient is a 35 year old white female who has had multiple  lower abdominal surgeries, who now presents with lower midline incisional  hernia.  The risks and benefits of procedure including bleeding, infection,  recurrence of the hernia were fully explained to the patient, who gave  informed consent.   PROCEDURE NOTE:  The patient was placed in supine position.  After induction  of general endotracheal anesthesia, the abdomen was prepped and draped in  the usual sterile technique with Betadine.  Surgical site confirmation was  performed.   A lower midline incision was made from the umbilicus down to the suprapubic  region..  The peritoneal cavity was entered into without difficulty.  Significant amount of scar tissue was present.  The underside the umbilicus  was noted to be without any hernia.  The granulation and scar tissue was  then excised.  Polypropylene mesh was then secured to the posterior aspect  of the rectus muscle using 2-0 Novofil interrupted sutures.  The anterior  rectus muscles reapproximated using 0 Ethilon interrupted sutures.  The  subcutaneous layer was reapproximated using 2-0 Vicryl interrupted sutures.  Skin was closed using staples.  0.5 cm Sensorcaine was instilled in the  surrounding wound.  Betadine ointment and dry sterile dressing were applied.   All tape and needle counts correct  the end of procedure.  The patient was  extubated in the operating room went back to recovery room awake in stable  condition.   COMPLICATIONS:  None.   SPECIMEN:  None.   BLOOD LOSS:  Minimal.      Dalia Heading, M.D.  Electronically Signed     MAJ/MEDQ  D:  09/01/2005  T:  09/01/2005  Job:  [id number]   cc:   Tilda Burrow, M.D.  Fax: [phone]   Oneal Deputy. Juanetta Gosling, M.D.  Fax: [phone]

## 2010-10-11 NOTE — Op Note (Signed)
[facility]  Patient:    Samantha [name], Samantha [name] Visit Number: [id number] MRN: [id number]          Service Type: OBS Location: 4A A417 01 Attending Physician:  Tilda Burrow Dictated by:   Franky Macho, M.D. Proc. Date: 07/12/01 Admit Date:  06/21/2001 Discharge Date: 06/25/2001   CC:         Christin Bach, M.D.  [facility]   Operative Report  PREOPERATIVE DIAGNOSIS:  Umbilical hernia.  POSTOPERATIVE DIAGNOSIS:  Umbilical hernia.  PROCEDURE:  Umbilical herniorrhaphy.  SURGEON:  Franky Macho, M.D.  ANESTHESIA:  General.  COMPLICATIONS:  None.  SPECIMENS:  None.  ESTIMATED BLOOD LOSS:  Minimal.  INDICATION:  The patient is a 35 year old white female who presents with a symptomatic umbilical hernia.  The risks and benefits of the procedure, including bleeding infection, recurrence of the hernia, were fully explained to the patient, who gave informed consent.  DESCRIPTION OF PROCEDURE:  The patient was placed in the supine position. After general anesthesia was administered, the abdomen was prepped and draped using the usual sterile technique with Betadine.  An infraumbilical incision was made down to the fascia.  The umbilicus was freed away from the underlying umbilical hernia.  The hernia sac was excised. The bowel contents were reduced without difficulty.  The fascia was reapproximated transversely using 0 Surgidac interrupted sutures.  The base of the umbilicus was secured back to the fascia using a 2-0 Vicryl interrupted suture.  The subcutaneous layer was reapproximated using a 3-0 Vicryl interrupted suture.  The skin was closed using staples.  Betadine ointment and dry sterile dressing were applied.  All tape and needle counts correct at the end of the procedure.  The patient was awakened and transferred to PACU in stable condition. Dictated by:   Franky Macho, M.D. Attending Physician:  Tilda Burrow DD:   07/12/01 TD:  07/12/01 Job: [id number]

## 2010-10-11 NOTE — Discharge Summary (Signed)
NAME:  BLESS, LISENBY NO.:  [id number]   MEDICAL RECORD NO.:  [id number]          PATIENT TYPE:  INP   LOCATION:  A412                          FACILITY:  [facility]   PHYSICIAN:  Tilda Burrow, M.D. DATE OF BIRTH:  27-Oct-1975   DATE OF ADMISSION:  08/08/2004  DATE OF DISCHARGE:  03/18/2006LH                                 DISCHARGE SUMMARY   ADMITTING DIAGNOSES:  1.  Menometrorrhagia.  2.  Dysmenorrhea.  3.  Status post endometrium ablation with failure due to ongoing bleeding.   POSTOPERATIVE DIAGNOSES:  1.  Menometrorrhagia.  2.  Dysmenorrhea.  3.  Status post endometrium ablation with failure due to ongoing bleeding.   PROCEDURE:  Total abdominal hysterectomy by Lazaro Arms, M.D.   DISCHARGE MEDICATIONS:  1.  Tylox one q.4h. p.r.n. pain, dispense 30.  2.  Reglan one p.o. a.c. x10 days, dispense 30.   FOLLOWUP:  In one week in our office.   HOSPITAL SUMMARY:  This 35 year old female is status post endometrial  ablation as well as tubal ligation.  She is admitted at this time for  abdominal hysterectomy after persistent, continued heavy, and prolonged  bleeding after endometrial ablation.  She has had Megace which has worked,  but she has gained quite a bit of weight.  She has a medical history  positive for interstitial cystitis.   PAST SURGICAL HISTORY:  Consistent of appendectomy.  C section x2.  Endometrial ablation and tonsillectomy.   PHYSICAL EXAMINATION:  GENERAL:  She is a healthy-appearing, Caucasian  female, alert and oriented x3.  See HPI for further information.   HOSPITAL COURSE:  The patient was admitted with laboratory data including  urinalysis negative, hemoglobin 13, hematocrit 34, potassium 3.8, sodium  137, glucose 100, blood type B positive.  Qualitative serum hCG negative.  She was admitted, underwent hysterectomy as described by Dr. Despina Hidden in his  office notes, with estimated blood loss of 300 mL.  Postoperatively, the  patient remained afebrile for 24 hours.  The following day, she had normal  blood pressures with postoperative hemoglobin of 12.2, hematocrit 33.7,  white count 8400, with 78 neutrophils.  She had delayed return of bowel  function.  She had continued nausea on postoperative day #2, was kept an  additional half day.  She received Dulcolax suppository.  She still had  moderate discomfort, but had had a bowel movement in response to the  suppositories, had active bowel sounds, and was interested in going home so  that she could visit her  son who is in the hospital elsewhere.  She was discharged with routine  discharge instructions including information to call labor and delivery for  any complications or continued vomiting.  Discharged at 4 p.m. on  postoperative day #2.      JVF/MEDQ  D:  08/10/2004  T:  08/10/2004  Job:  [id number]   cc:   [facility]

## 2010-10-11 NOTE — H&P (Signed)
NAME:  Samantha [name], Samantha [name] NO.:  [id number]   MEDICAL RECORD NO.:  [id number]          PATIENT TYPE:  AMB   LOCATION:                                FACILITY:  [facility]   PHYSICIAN:  Dalia Heading, M.D.  DATE OF BIRTH:  1976/03/20   DATE OF ADMISSION:  DATE OF DISCHARGE:  LH                                HISTORY & PHYSICAL   CHIEF COMPLAINT:  Incisional hernia.   HISTORY OF PRESENT ILLNESS:  The patient is a 35 year old white female  status post multiple abdominal surgeries in the past, who underwent a  previous umbilical herniorrhaphy in 2006.  She now presents with pain and  fascial laxity in a region just below the umbilicus to the suprapubic  region.  This is along the midline.  She has been having increasing pain  over the past few months.  No nausea or vomiting have been noted.   PAST MEDICAL HISTORY:  Anxiety.   PAST SURGICAL HISTORY:  As noted above, appendectomy, tonsillectomy,  multiple C-sections, hysterectomy.   CURRENT MEDICATIONS:  Celexa 20 mg p.o. daily.   ALLERGIES:  No known drug allergies.   REVIEW OF SYSTEMS:  Noncontributory.   PHYSICAL EXAMINATION:  GENERAL:  The patient is a well-developed, well-  nourished white female in no acute distress.  LUNGS:  Clear to auscultation with equal breath sounds bilaterally.  CARDIAC:  Heart examination reveals regular rate and rhythm without S3, S4,  or murmurs.  ABDOMEN:  Soft.  No umbilical hernia is noted.  She is tender along the  midline from below the umbilicus to the suprapubic region.  Fascial weakness  as noted, though no specific hernia defect can be identified.  This may be  due to the patient's body habitus.   IMPRESSION:  Incisional hernia.   PLAN:  The patient is scheduled for an incisional herniorrhaphy with mesh on  September 01, 2005.  The risks and benefits of the procedure including bleeding,  infection, pain, and recurrence of the hernia were fully explained to the  patient, who gave  informed consent.      Dalia Heading, M.D.  Electronically Signed     MAJ/MEDQ  D:  08/26/2005  T:  08/26/2005  Job:  [id number]   cc:   [facility]  Fax: [phone]   Tilda Burrow, M.D.  Fax: [phone]   Oneal Deputy. Juanetta Gosling, M.D.  Fax: [phone]

## 2010-10-11 NOTE — H&P (Signed)
NAME:  Samantha, [name] NO.:  [id number]   MEDICAL RECORD NO.:  [id number]          PATIENT TYPE:  OBV   LOCATION:  A427                          FACILITY:  [facility]   PHYSICIAN:  Lazaro Arms, M.D.   DATE OF BIRTH:  August 11, 1975   DATE OF ADMISSION:  08/16/2004  DATE OF DISCHARGE:  LH                                HISTORY & PHYSICAL   HISTORY OF PRESENT ILLNESS:  Samantha [name] is a 35 year old white female, eight days  postoperative from an abdominal hysterectomy who has been having  difficulties postoperatively with nausea and vomiting.  She had been seen  earlier in the week, actually had ripped through some of her staples because  of her violent vomiting that is non-bilious.  She presented to the office  yesterday morning, having not eaten anything now for 24 hours.  She had  diminished bowel sounds.  Her abdominal examination was otherwise benign.  No rebound.  Pelvic examination was negative.  Her incision was clean, dry,  and intact.  Some slight separation right at the skin edge laterally on the  right.  She was admitted to the hospital for hydration and evaluation of a  probable postoperative ileus.   PAST MEDICAL HISTORY:  1.  Interstitial cystitis.  2.  Depression.   PAST SURGICAL HISTORY:  1.  Two cesarean sections.  2.  Endometrial ablation.  3.  Hysterectomy.   PAST OBSTETRICAL HISTORY:  Two cesarean sections.   REVIEW OF SYSTEMS:  As per HPI, otherwise negative.   PHYSICAL EXAMINATION:  HEENT:  Unremarkable.  NECK:  Thyroid is normal.  LUNGS:  Clear.  HEART:  Regular rate and rhythm, no murmurs, rubs, or gallops.  ABDOMEN:  Soft, mildly distended, quiet bowel sounds throughout, no high-  pitched bowel sounds, no evidence of an obstruction.  PELVIC:  Benign.  She has no blood in the vault and no cuff hematoma  palpated.  EXTREMITIES:  Warm, no edema.   IMPRESSION:  Postoperative ileus.   PLAN:  The patient is admitted for bowel rest and evaluation  with serial  exams and acute abdominal series.  We will hydrate her and stimulate her  with enemas.      LHE/MEDQ  D:  08/17/2004  T:  08/17/2004  Job:  [id number]

## 2010-10-15 ENCOUNTER — Emergency Department (HOSPITAL_COMMUNITY): Payer: Medicaid Other

## 2010-10-15 ENCOUNTER — Emergency Department (HOSPITAL_COMMUNITY)
Admission: EM | Admit: 2010-10-15 | Discharge: 2010-10-15 | Disposition: A | Discharge status: Home / Self Care | Payer: Medicaid Other | Attending: Emergency Medicine | Admitting: Emergency Medicine

## 2010-10-15 DIAGNOSIS — S60229A Contusion of unspecified hand, initial encounter: Secondary | ICD-10-CM | POA: Insufficient documentation

## 2010-10-15 DIAGNOSIS — M79609 Pain in unspecified limb: Secondary | ICD-10-CM | POA: Insufficient documentation

## 2010-10-16 NOTE — Op Note (Signed)
  NAME:  MELANIA, KIRKS NO.:  [id number]  MEDICAL RECORD NO.:  [id number]           PATIENT TYPE:  O  LOCATION:  2550                         FACILITY:  [facility]  PHYSICIAN:  Johnette Abraham, MD    DATE OF BIRTH:  [birth year]  DATE OF PROCEDURE:  09/19/2010 DATE OF DISCHARGE:                              OPERATIVE REPORT   PREOPERATIVE DIAGNOSIS:  Dog bite of the right hand with possible infection of the right hand.  POSTOPERATIVE DIAGNOSIS:  Dog bite of the right hand with possible infection of the right hand.  PROCEDURE:  Incision and drainage of multiple dog bite wounds; 1. On the dorsal aspect of the hand. 2. On the volar aspect of the hand packing with Iodoform gauze.  ANESTHESIA:  General.  ESTIMATED BLOOD LOSS:  Minimal.  Cultures sent.  No specimens.  INDICATIONS:  Ms. Tuckerman is a 35 year old female who was bitten by dog several days ago, presented to [facility], was treated with Augmentin.  She re-presented today with worsening pain, swelling and sign and symptoms of infection, possible early tenosynovitis.  On my evaluation, she had tenderness with finger extension and flexion.  She had multiple puncture wounds suggestive of infection and possible early tenosynovitis.  Risks, benefits and alternatives of surgery were discussed with her, consent was obtained for surgery.  PROCEDURE IN DETAILS:  The patient was taken to the operating room, placed supine on the operating room table where general anesthesia was administered without difficulty.  A time-out was performed.  She received preoperative antibiotics.  The right upper extremity was prepped and draped in normal sterile fashion.  Beginning on the dorsal aspect, the puncture wound just proximal to the MCP joint of the fifth finger the scab was excised.  There was some serosanguineous drainage. This was sent for culture.  No gross pus.  The wound was probed.  The skin and subcutaneous  tissue edges around the puncture wound were debrided and this wound was irrigated with irrigation solution and packed with Iodoform gauze.  Following the volar aspect was addressed, there was 1 puncture wound overlying the proximal phalanx.  This was a very small wound.  The scab was removed.  The surrounding skin and subcutaneous tissue were gently debrided.  The wound was irrigated out and packed with Iodoform gauze.  There was another wound in the palm in between the fourth and fifth metacarpal heads, again this one was small. Scab was removed.  Skin and subcutaneous tissue were debrided.  The wound was irrigated and packed with Iodoform gauze.  A sterile dressing was applied, approximately 15 mL of 0.25% plain Marcaine were infiltrated around the wounds and around the wrist for a regional block.  The patient tolerated the procedure well, was taken to the recovery room in stable condition.     Johnette Abraham, MD     HCC/MEDQ  D:  09/19/2010  T:  09/20/2010  Job:  [id number]  Electronically Signed by Knute Neu MD on 10/16/2010 02:05:43 PM

## 2010-10-18 ENCOUNTER — Encounter (HOSPITAL_COMMUNITY): Payer: Medicaid Other

## 2010-10-18 ENCOUNTER — Emergency Department (HOSPITAL_COMMUNITY)
Admission: EM | Admit: 2010-10-18 | Discharge: 2010-10-18 | Discharge status: Against Medical Advice | Payer: Medicaid Other | Attending: Emergency Medicine | Admitting: Emergency Medicine

## 2010-10-18 ENCOUNTER — Other Ambulatory Visit: Payer: Self-pay | Admitting: Infectious Diseases

## 2010-10-18 DIAGNOSIS — R319 Hematuria, unspecified: Secondary | ICD-10-CM | POA: Insufficient documentation

## 2010-10-18 LAB — URINALYSIS, ROUTINE W REFLEX MICROSCOPIC
Glucose, UA: NEGATIVE mg/dL
Ketones, ur: NEGATIVE mg/dL

## 2010-10-18 LAB — URINE MICROSCOPIC-ADD ON

## 2010-10-18 LAB — SURGICAL PCR SCREEN: Staphylococcus aureus: POSITIVE — AB

## 2010-10-19 ENCOUNTER — Emergency Department (HOSPITAL_COMMUNITY)
Admission: EM | Admit: 2010-10-19 | Discharge: 2010-10-19 | Disposition: A | Discharge status: Home / Self Care | Payer: Medicaid Other | Attending: Emergency Medicine | Admitting: Emergency Medicine

## 2010-10-19 DIAGNOSIS — F3289 Other specified depressive episodes: Secondary | ICD-10-CM | POA: Insufficient documentation

## 2010-10-19 DIAGNOSIS — K900 Celiac disease: Secondary | ICD-10-CM | POA: Insufficient documentation

## 2010-10-19 DIAGNOSIS — F329 Major depressive disorder, single episode, unspecified: Secondary | ICD-10-CM | POA: Insufficient documentation

## 2010-10-19 DIAGNOSIS — R319 Hematuria, unspecified: Secondary | ICD-10-CM | POA: Insufficient documentation

## 2010-10-19 DIAGNOSIS — Z79899 Other long term (current) drug therapy: Secondary | ICD-10-CM | POA: Insufficient documentation

## 2010-10-19 LAB — URINE MICROSCOPIC-ADD ON

## 2010-10-19 LAB — URINALYSIS, ROUTINE W REFLEX MICROSCOPIC
Leukocytes, UA: NEGATIVE
Nitrite: NEGATIVE
Specific Gravity, Urine: 1.015 (ref 1.005–1.030)
Urobilinogen, UA: 0.2 mg/dL (ref 0.0–1.0)
pH: 5 (ref 5.0–8.0)

## 2010-10-20 ENCOUNTER — Emergency Department (HOSPITAL_COMMUNITY)
Admission: EM | Admit: 2010-10-20 | Discharge: 2010-10-21 | Disposition: A | Discharge status: Home / Self Care | Payer: Medicaid Other | Attending: Emergency Medicine | Admitting: Emergency Medicine

## 2010-10-20 DIAGNOSIS — F3289 Other specified depressive episodes: Secondary | ICD-10-CM | POA: Insufficient documentation

## 2010-10-20 DIAGNOSIS — R319 Hematuria, unspecified: Secondary | ICD-10-CM | POA: Insufficient documentation

## 2010-10-20 DIAGNOSIS — R112 Nausea with vomiting, unspecified: Secondary | ICD-10-CM | POA: Insufficient documentation

## 2010-10-20 DIAGNOSIS — F329 Major depressive disorder, single episode, unspecified: Secondary | ICD-10-CM | POA: Insufficient documentation

## 2010-10-21 LAB — URINE MICROSCOPIC-ADD ON

## 2010-10-21 LAB — URINALYSIS, ROUTINE W REFLEX MICROSCOPIC
Bilirubin Urine: NEGATIVE
Glucose, UA: NEGATIVE mg/dL
Ketones, ur: NEGATIVE mg/dL
Protein, ur: NEGATIVE mg/dL
Urobilinogen, UA: 0.2 mg/dL (ref 0.0–1.0)

## 2010-10-21 LAB — CBC
Hemoglobin: 12.8 g/dL (ref 12.0–15.0)
MCH: 29 pg (ref 26.0–34.0)
MCHC: 35.3 g/dL (ref 30.0–36.0)
MCV: 82.3 fL (ref 78.0–100.0)

## 2010-10-21 LAB — BASIC METABOLIC PANEL
BUN: 17 mg/dL (ref 6–23)
CO2: 26 mEq/L (ref 19–32)
Calcium: 10 mg/dL (ref 8.4–10.5)
Chloride: 100 mEq/L (ref 96–112)
Creatinine, Ser: 0.59 mg/dL (ref 0.4–1.2)
GFR calc Af Amer: >60 mL/min (ref >60)

## 2010-10-22 LAB — URINE CULTURE
Colony Count: NO GROWTH
Culture  Setup Time: 201205290054
Culture: NO GROWTH

## 2010-10-22 NOTE — Discharge Summary (Signed)
  NAME:  GWENDY, BOEDER NO.:  [id number]  MEDICAL RECORD NO.:  [id number]           PATIENT TYPE:  O  LOCATION:  2550                         FACILITY:  [facility]  PHYSICIAN:  Johnette Abraham, MD    DATE OF BIRTH:  1976-02-23  DATE OF ADMISSION:  09/19/2010 DATE OF DISCHARGE:  09/20/2010                              DISCHARGE SUMMARY   BRIEF HOSPITAL COURSE:  This patient was transferred from [facility] with a dog bite infection of the hand.  The hand was I and D'ed.  The patient did not stay overnight and was sent home after I and D with specific instructions.  Please see operative report for details.  ADMISSION DIAGNOSIS:  Dog bite and infection, early tenosynovitis of the hand.  DISCHARGE DIAGNOSIS:  Dog bite and infection, early tenosynovitis of the hand.     Johnette Abraham, MD     HCC/MEDQ  D:  10/16/2010  T:  10/17/2010  Job:  [id number]  Electronically Signed by Knute Neu MD on 10/22/2010 04:29:19 PM

## 2010-10-25 ENCOUNTER — Ambulatory Visit (HOSPITAL_COMMUNITY): Payer: Medicaid Other

## 2010-10-25 ENCOUNTER — Ambulatory Visit (HOSPITAL_COMMUNITY): Admission: RE | Admit: 2010-10-25 | Payer: Medicaid Other | Source: Ambulatory Visit | Admitting: Urology

## 2010-10-25 ENCOUNTER — Other Ambulatory Visit: Payer: Self-pay | Admitting: Urology

## 2010-10-25 ENCOUNTER — Ambulatory Visit (HOSPITAL_COMMUNITY)
Admission: RE | Admit: 2010-10-25 | Discharge: 2010-10-25 | Disposition: A | Discharge status: Home / Self Care | Payer: Medicaid Other | Source: Ambulatory Visit | Attending: Urology | Admitting: Urology

## 2010-10-25 DIAGNOSIS — N3090 Cystitis, unspecified without hematuria: Secondary | ICD-10-CM | POA: Insufficient documentation

## 2010-10-25 DIAGNOSIS — R310 Gross hematuria: Secondary | ICD-10-CM | POA: Insufficient documentation

## 2010-10-28 ENCOUNTER — Encounter (HOSPITAL_COMMUNITY): Payer: Medicaid Other

## 2010-10-29 ENCOUNTER — Ambulatory Visit (HOSPITAL_COMMUNITY): Payer: Medicaid Other

## 2010-10-29 ENCOUNTER — Ambulatory Visit (HOSPITAL_COMMUNITY)
Admission: RE | Admit: 2010-10-29 | Discharge: 2010-10-29 | Disposition: A | Discharge status: Home / Self Care | Payer: Medicaid Other | Source: Ambulatory Visit | Attending: Urology | Admitting: Urology

## 2010-10-29 ENCOUNTER — Other Ambulatory Visit (HOSPITAL_COMMUNITY): Payer: Medicaid Other

## 2010-10-29 DIAGNOSIS — N3020 Other chronic cystitis without hematuria: Secondary | ICD-10-CM | POA: Insufficient documentation

## 2010-10-29 DIAGNOSIS — Z466 Encounter for fitting and adjustment of urinary device: Secondary | ICD-10-CM | POA: Insufficient documentation

## 2010-10-29 DIAGNOSIS — R310 Gross hematuria: Secondary | ICD-10-CM | POA: Insufficient documentation

## 2010-10-29 DIAGNOSIS — Z01812 Encounter for preprocedural laboratory examination: Secondary | ICD-10-CM | POA: Insufficient documentation

## 2010-10-29 DIAGNOSIS — Z01818 Encounter for other preprocedural examination: Secondary | ICD-10-CM | POA: Insufficient documentation

## 2010-10-29 LAB — HEMOGLOBIN AND HEMATOCRIT, BLOOD: HCT: 33.5 % — ABNORMAL LOW (ref 36.0–46.0)

## 2010-11-01 LAB — AFB CULTURE WITH SMEAR (NOT AT ARMC)

## 2010-11-02 ENCOUNTER — Emergency Department (HOSPITAL_COMMUNITY)
Admission: EM | Admit: 2010-11-02 | Discharge: 2010-11-02 | Disposition: A | Discharge status: Home / Self Care | Payer: Medicaid Other | Source: Home / Self Care | Attending: Emergency Medicine | Admitting: Emergency Medicine

## 2010-11-02 ENCOUNTER — Emergency Department (HOSPITAL_COMMUNITY)
Admission: EM | Admit: 2010-11-02 | Discharge: 2010-11-02 | Disposition: A | Discharge status: Home / Self Care | Payer: Medicaid Other | Attending: Emergency Medicine | Admitting: Emergency Medicine

## 2010-11-02 DIAGNOSIS — Z9889 Other specified postprocedural states: Secondary | ICD-10-CM | POA: Insufficient documentation

## 2010-11-02 DIAGNOSIS — R319 Hematuria, unspecified: Secondary | ICD-10-CM | POA: Insufficient documentation

## 2010-11-02 DIAGNOSIS — F3289 Other specified depressive episodes: Secondary | ICD-10-CM | POA: Insufficient documentation

## 2010-11-02 DIAGNOSIS — N23 Unspecified renal colic: Secondary | ICD-10-CM | POA: Insufficient documentation

## 2010-11-02 DIAGNOSIS — F329 Major depressive disorder, single episode, unspecified: Secondary | ICD-10-CM | POA: Insufficient documentation

## 2010-11-02 DIAGNOSIS — N200 Calculus of kidney: Secondary | ICD-10-CM | POA: Insufficient documentation

## 2010-11-02 LAB — URINALYSIS, ROUTINE W REFLEX MICROSCOPIC
Bilirubin Urine: NEGATIVE
Glucose, UA: NEGATIVE mg/dL
Glucose, UA: NEGATIVE mg/dL
Protein, ur: NEGATIVE mg/dL
Specific Gravity, Urine: 1.015 (ref 1.005–1.030)
Specific Gravity, Urine: 1.027 (ref 1.005–1.030)
Urobilinogen, UA: 0.2 mg/dL (ref 0.0–1.0)
Urobilinogen, UA: 0.2 mg/dL (ref 0.0–1.0)
pH: 6 (ref 5.0–8.0)

## 2010-11-02 LAB — URINE MICROSCOPIC-ADD ON

## 2010-11-02 LAB — POCT I-STAT, CHEM 8
BUN: 15 mg/dL (ref 6–23)
Creatinine, Ser: 0.6 mg/dL (ref 0.4–1.2)
Potassium: 4 mEq/L (ref 3.5–5.1)
Sodium: 140 mEq/L (ref 135–145)
TCO2: 24 mmol/L (ref 0–100)

## 2010-11-03 LAB — URINE CULTURE: Colony Count: 50000

## 2010-11-04 NOTE — Op Note (Addendum)
NAME:  Samantha [name], Samantha [name] NO.:  [id number]  MEDICAL RECORD NO.:  [id number]           PATIENT TYPE:  O  LOCATION:  DAY                           FACILITY:  [facility]  PHYSICIAN:  Excell Seltzer. Annabell Howells, M.D.    DATE OF BIRTH:  06/30/1975  DATE OF PROCEDURE:  10/25/2010 DATE OF DISCHARGE:                              OPERATIVE REPORT   PROCEDURES:  Cystoscopy, bilateral retrograde pyelography with interpretation, left ureteroscopy, insertion of left double-J stent, hydrodistention of bladder with installation of Pyridium and Marcaine.  PREOPERATIVE DIAGNOSIS:  History of interstitial cystitis and intermittent gross hematuria.  POSTOPERATIVE DIAGNOSIS:  History of interstitial cystitis and intermittent gross hematuria with bleeding from the left ureter.  SURGEON:  Excell Seltzer. Annabell Howells, MD  ANESTHESIA:  General.  SPECIMEN:  Cytologies from the left renal pelvis.  DRAIN:  6-French 24-cm double-J stent.  COMPLICATIONS:  None.  INDICATIONS:  Mirage is a 35 year old white female initially sent by Dr. Juanetta Gosling for gross hematuria that began in November 2011.  She has had rather severe bilateral back right greater than left pain since October 2011 and has had 3 CT scans including enhance scan that were unremarkable.  She had frequent urination q.30-60 minutes and nocturia 3- 5 times and had been given a diagnosis of interstitial cystitis in 2003 by Dr. Emelda Fear and treated with DMSO.  She is to undergo cystoscopy, bilateral retrograde pyelography and hydrodistention of bladder for further assessment  FINDINGS OF PROCEDURE:  She was taken to the operating room where her general anesthetic was induced.  She was given Cipro.  She was placed in lithotomy position.  Her perineum and vagina were prepped with Betadine solution.  She was draped in usual sterile fashion.  Cystoscopy was performed using the 22-French scope and the 30-degree lens.  A complete inspection of bladder revealed a  smooth wall without mucosal lesions. Her ureteral orifices were unremarkable and the urethra was unremarkable.  The left ureteral orifice was cannulated with a 5-French open-end catheter and contrast was instilled.  The ureter was unremarkable to the kidney.  The kidney had no obvious filling defects but there was some mild blunting of the calyces and was difficult to know if this was procedure artifact or a true dilation.  After removal of the ureteral catheter, she was noted to efflux bloody urine from the left ureteral meatus that was persistent  At this point, the ureteral catheter was reinserted to the kidney and saline was used to obtain washings.  Interestingly, the washings from the kidney was quite clear.  But once again upon removal of the ureteroscope, she had bleeding.  She then underwent cannulation of right ureteral orifice and installation of contrast.  This revealed a normal ureter and internal collecting system and no bloody efflux after the retrograde pyelogram.  At this point, a guidewire was passed to the kidney on the left and an initial attempt was made to pass the 6-French rigid ureteroscope, but I was unable to get above the meatus.  So the ureter was dilated with a 12- French introducer sheath inner core dilator which passed easily  to the mid ureteral level.  The ureteroscope was then inserted alongside the wire and no obvious areas of bleeding or lesions were noted.  There was some mild mucosal splitting in the distal ureter but that appeared to be secondary to the instrumentation and not preexisting finding.  At this point, I have passed a longer dilator but I was unable to get it above the mid proximal ureter and passed the rigid scope to approximately the L3 level once again without obvious lesions, but blood was noted to efflux proximal to this.  At this point, the guidewire was left in place.  The ureteroscope was removed and the cystoscope was  reinserted alongside the guidewire.  The bladder was then dilated to capacity under 80 cm of water pressure and then drained.  Her capacity under anesthesia was 850 mL which is low and suggestive of interstitial cystitis.  However inspection of the bladder revealed only a scant glomerulations around the left ureteral orifice on the posterior wall.  However, interstitial cystitis remains possibility.  At this point, a 6-French 24-cm double-J stent was inserted over the wire after the cystoscope had been replaced over the wire.  The stent was advanced under fluoroscopic guidance to the kidney.  The wire was removed with good coil in the bladder and a good coil in the kidney. The bladder was then drained and the cystoscope was removed.  A 16-French red rubber catheter was inserted and the bladder was filled with 30 mL of 0.25% Marcaine with 400 mg of crushed Pyridium.  The red rubber catheter was removed.  The patient was taken down from lithotomy position.  Her anesthetic was reversed and she was moved to the recovery room in stable condition.  She has oxycodone on hand and will be sent home with prescriptions for Pyridium and Cipro.  I encouraged to use ibuprofen for the pain as needed.  I will schedule her for a relook ureteroscopy with a flexible scope to the kidney in about a week after the ureters had a chance to dilate.     Excell Seltzer. Annabell Howells, M.D.     JJW/MEDQ  D:  10/25/2010  T:  10/25/2010  Job:  [id number]  cc:   Ramon Dredge L. Juanetta Gosling, M.D. Fax: [phone]  Dr. Emelda Fear  Electronically Signed by Bjorn Pippin M.D. on 11/04/2010 07:41:45 AM

## 2010-11-04 NOTE — Op Note (Signed)
NAME:  DHAMAR, GREGORY NO.:  [id number]  MEDICAL RECORD NO.:  [id number]  LOCATION:  DAYP                          FACILITY:  [facility]  PHYSICIAN:  Excell Seltzer. Annabell Howells, M.D.    DATE OF BIRTH:  [birth year]  DATE OF PROCEDURE:  10/29/2010 DATE OF DISCHARGE:                              OPERATIVE REPORT   PROCEDURES:  Cystoscopy, removal of left double-J stent, left ureteroscopy with stone manipulation, and insertion of left double-J stent.  PREOPERATIVE DIAGNOSIS:  Hematuria from left ureter and collecting system.  POSTOPERATIVE DIAGNOSIS:  Hematuria from left ureter and collecting system with a small left renal stone.  SURGEON:  Excell Seltzer. Annabell Howells, MD  ANESTHESIA:  General.  SPECIMEN:  None.  DRAINS:  6-French 24-cm double-J stent.  COMPLICATIONS:  None.  INDICATIONS:  Samantha [name] is a 35 year old white female who has intermittent gross hematuria and painful bladder.  She underwent cystoscopy, hydrodistention of bladder, retrograde pyelogram, and attempted ureteroscopy last week.  I was unable to access the kidney.  On the retrograde, there was some minimal dilation of left collecting system and blood was noted to efflux from the left ureter after the retrograde. It was felt that repeat ureteroscopy was indicated after a period of stent drainage.  FINDINGS/DESCRIPTION OF PROCEDURE:  She was given Cipro.  She was taken to the operating room where general anesthetic was induced.  She was placed in lithotomy position.  Her perineum and genitalia were prepped with Betadine solution.  She was draped in the usual sterile fashion. Cystoscopy was performed using a 22-French scope and 30-degree lens. The stent was actually found to be coiled in the bladder and was removed with a grasping forceps.  A guidewire was then passed to the kidney.  A 12-French introducer sheath dilator 35 cm in length was then passed just below the kidney and then removed.  A 6-French flexible  ureteroscope was then passed over the wire to the kidney without difficulty and the wire was removed.  The kidney was inspected thoroughly.  Contrast was instilled to aid identification of all the caliceal structures and no mucosal lesions were identified other than some stent and wire irritation.  However, in the lower pole of the kidney, there was a small tan stone that appeared to measure 2-3 mm.  An escape basket was passed and attempt was made to retrieve the stone, however, was unable to engage the stone in the basket.  The wire was removed and reinspection was performed and the stone was no longer visible.  I inspected the remaining calyces and once again was unable to find the stone and it was felt that it likely flushed at either a different part of the kidney or out.  The guidewire was then reinserted through the scope and the scope was backed out over the wire inspecting the ureter along the way.  There was some mucosal splitting in the proximal ureter from the dilation but the remainder of the ureter was unremarkable.  Once the ureteroscope was out, the cystoscope was reinserted over the wire and a 6-French 26-cm double-J stent with string was passed without difficulty under fluoroscopic guidance.  The wire was removed  leaving a good coil in the kidney and good coil in the bladder.  The bladder was drained.  The stent string was left exiting the urethra.  It was tied close to the meatus and trimmed.  The patient was taken down from the lithotomy position.  Her anesthetic was reversed.  She was moved to the recovery room in stable condition.  There were no complications.     Excell Seltzer. Annabell Howells, M.D.     JJW/MEDQ  D:  10/29/2010  T:  10/29/2010  Job:  [id number]  cc:   Ramon Dredge L. Juanetta Gosling, M.D. Fax: [phone]  Electronically Signed by Bjorn Pippin M.D. on 11/04/2010 07:41:52 AM

## 2010-11-08 ENCOUNTER — Ambulatory Visit: Payer: Medicaid Other | Admitting: Urology

## 2010-11-10 ENCOUNTER — Emergency Department (HOSPITAL_COMMUNITY)
Admission: EM | Admit: 2010-11-10 | Discharge: 2010-11-10 | Disposition: A | Discharge status: Home / Self Care | Payer: Medicaid Other | Attending: Emergency Medicine | Admitting: Emergency Medicine

## 2010-11-10 DIAGNOSIS — Z79899 Other long term (current) drug therapy: Secondary | ICD-10-CM | POA: Insufficient documentation

## 2010-11-10 DIAGNOSIS — K900 Celiac disease: Secondary | ICD-10-CM | POA: Insufficient documentation

## 2010-11-10 DIAGNOSIS — R319 Hematuria, unspecified: Secondary | ICD-10-CM | POA: Insufficient documentation

## 2010-11-10 DIAGNOSIS — F329 Major depressive disorder, single episode, unspecified: Secondary | ICD-10-CM | POA: Insufficient documentation

## 2010-11-10 DIAGNOSIS — F3289 Other specified depressive episodes: Secondary | ICD-10-CM | POA: Insufficient documentation

## 2010-11-10 LAB — CBC
MCH: 29.6 pg (ref 26.0–34.0)
MCHC: 34.5 g/dL (ref 30.0–36.0)
MCV: 85.7 fL (ref 78.0–100.0)
Platelets: 197 10*3/uL (ref 150–400)
RBC: 4.06 MIL/uL (ref 3.87–5.11)
RDW: 14 % (ref 11.5–15.5)

## 2010-11-10 LAB — URINALYSIS, ROUTINE W REFLEX MICROSCOPIC
Leukocytes, UA: NEGATIVE
Nitrite: NEGATIVE
Protein, ur: NEGATIVE mg/dL
Specific Gravity, Urine: 1.02 (ref 1.005–1.030)
Urobilinogen, UA: 0.2 mg/dL (ref 0.0–1.0)

## 2010-11-10 LAB — DIFFERENTIAL
Eosinophils Absolute: 0 10*3/uL (ref 0.0–0.7)
Eosinophils Relative: 0 % (ref 0–5)
Lymphs Abs: 1.9 10*3/uL (ref 0.7–4.0)
Monocytes Absolute: 0.4 10*3/uL (ref 0.1–1.0)
Monocytes Relative: 9 % (ref 3–12)

## 2010-11-10 LAB — URINE MICROSCOPIC-ADD ON

## 2010-11-10 LAB — BASIC METABOLIC PANEL
CO2: 30 mEq/L (ref 19–32)
Calcium: 9.8 mg/dL (ref 8.4–10.5)
Creatinine, Ser: 0.64 mg/dL (ref 0.50–1.10)

## 2010-11-16 ENCOUNTER — Emergency Department (HOSPITAL_COMMUNITY): Payer: Medicaid Other

## 2010-11-16 ENCOUNTER — Emergency Department (HOSPITAL_COMMUNITY)
Admission: EM | Admit: 2010-11-16 | Discharge: 2010-11-17 | Disposition: A | Discharge status: Home / Self Care | Payer: Medicaid Other | Attending: Emergency Medicine | Admitting: Emergency Medicine

## 2010-11-16 DIAGNOSIS — R071 Chest pain on breathing: Secondary | ICD-10-CM | POA: Insufficient documentation

## 2010-11-16 DIAGNOSIS — G8929 Other chronic pain: Secondary | ICD-10-CM | POA: Insufficient documentation

## 2010-11-16 DIAGNOSIS — S139XXA Sprain of joints and ligaments of unspecified parts of neck, initial encounter: Secondary | ICD-10-CM | POA: Insufficient documentation

## 2010-11-16 DIAGNOSIS — R110 Nausea: Secondary | ICD-10-CM | POA: Insufficient documentation

## 2010-11-16 DIAGNOSIS — F0781 Postconcussional syndrome: Secondary | ICD-10-CM | POA: Insufficient documentation

## 2010-11-16 DIAGNOSIS — M549 Dorsalgia, unspecified: Secondary | ICD-10-CM | POA: Insufficient documentation

## 2010-11-16 DIAGNOSIS — W108XXA Fall (on) (from) other stairs and steps, initial encounter: Secondary | ICD-10-CM | POA: Insufficient documentation

## 2010-11-16 DIAGNOSIS — F3289 Other specified depressive episodes: Secondary | ICD-10-CM | POA: Insufficient documentation

## 2010-11-16 DIAGNOSIS — F329 Major depressive disorder, single episode, unspecified: Secondary | ICD-10-CM | POA: Insufficient documentation

## 2010-11-16 DIAGNOSIS — R319 Hematuria, unspecified: Secondary | ICD-10-CM | POA: Insufficient documentation

## 2010-11-16 DIAGNOSIS — Z79899 Other long term (current) drug therapy: Secondary | ICD-10-CM | POA: Insufficient documentation

## 2010-11-16 DIAGNOSIS — R109 Unspecified abdominal pain: Secondary | ICD-10-CM | POA: Insufficient documentation

## 2010-11-16 LAB — COMPREHENSIVE METABOLIC PANEL
BUN: 10 mg/dL (ref 6–23)
CO2: 29 mEq/L (ref 19–32)
Chloride: 103 mEq/L (ref 96–112)
Creatinine, Ser: 0.54 mg/dL (ref 0.50–1.10)
GFR calc non Af Amer: >60 mL/min (ref >60)
Total Bilirubin: 0.2 mg/dL — ABNORMAL LOW (ref 0.3–1.2)

## 2010-11-16 LAB — URINALYSIS, ROUTINE W REFLEX MICROSCOPIC
Glucose, UA: NEGATIVE mg/dL
Ketones, ur: NEGATIVE mg/dL
Leukocytes, UA: NEGATIVE
Protein, ur: NEGATIVE mg/dL

## 2010-11-16 LAB — CBC
HCT: 31.6 % — ABNORMAL LOW (ref 36.0–46.0)
MCV: 86.3 fL (ref 78.0–100.0)
RBC: 3.66 MIL/uL — ABNORMAL LOW (ref 3.87–5.11)
WBC: 3.6 10*3/uL — ABNORMAL LOW (ref 4.0–10.5)

## 2010-11-16 LAB — DIFFERENTIAL
Lymphocytes Relative: 39 % (ref 12–46)
Lymphs Abs: 1.4 10*3/uL (ref 0.7–4.0)
Neutrophils Relative %: 52 % (ref 43–77)

## 2010-11-16 LAB — TYPE AND SCREEN
ABO/RH(D): B POS
Antibody Screen: NEGATIVE

## 2010-11-16 LAB — URINE MICROSCOPIC-ADD ON

## 2010-11-16 MED ORDER — IOHEXOL 300 MG/ML  SOLN
100.0000 mL | Freq: Once | INTRAMUSCULAR | Status: AC | PRN
  Administered 2010-11-16: 100 mL via INTRAVENOUS

## 2010-11-20 ENCOUNTER — Emergency Department (HOSPITAL_COMMUNITY)
Admission: EM | Admit: 2010-11-20 | Discharge: 2010-11-20 | Disposition: A | Discharge status: Home / Self Care | Payer: Medicaid Other | Attending: Emergency Medicine | Admitting: Emergency Medicine

## 2010-11-20 DIAGNOSIS — M549 Dorsalgia, unspecified: Secondary | ICD-10-CM | POA: Insufficient documentation

## 2010-11-20 DIAGNOSIS — F3289 Other specified depressive episodes: Secondary | ICD-10-CM | POA: Insufficient documentation

## 2010-11-20 DIAGNOSIS — F329 Major depressive disorder, single episode, unspecified: Secondary | ICD-10-CM | POA: Insufficient documentation

## 2010-11-20 DIAGNOSIS — R109 Unspecified abdominal pain: Secondary | ICD-10-CM | POA: Insufficient documentation

## 2010-11-20 DIAGNOSIS — Z862 Personal history of diseases of the blood and blood-forming organs and certain disorders involving the immune mechanism: Secondary | ICD-10-CM | POA: Insufficient documentation

## 2010-11-20 DIAGNOSIS — IMO0001 Reserved for inherently not codable concepts without codable children: Secondary | ICD-10-CM | POA: Insufficient documentation

## 2010-11-20 DIAGNOSIS — R51 Headache: Secondary | ICD-10-CM | POA: Insufficient documentation

## 2010-11-22 ENCOUNTER — Emergency Department (HOSPITAL_COMMUNITY): Payer: Medicaid Other

## 2010-11-22 ENCOUNTER — Emergency Department (HOSPITAL_COMMUNITY)
Admission: EM | Admit: 2010-11-22 | Discharge: 2010-11-22 | Disposition: A | Discharge status: Home / Self Care | Payer: Medicaid Other | Attending: Emergency Medicine | Admitting: Emergency Medicine

## 2010-11-22 DIAGNOSIS — G8929 Other chronic pain: Secondary | ICD-10-CM | POA: Insufficient documentation

## 2010-11-22 DIAGNOSIS — R4789 Other speech disturbances: Secondary | ICD-10-CM | POA: Insufficient documentation

## 2010-11-22 DIAGNOSIS — M549 Dorsalgia, unspecified: Secondary | ICD-10-CM | POA: Insufficient documentation

## 2010-11-22 DIAGNOSIS — F1910 Other psychoactive substance abuse, uncomplicated: Secondary | ICD-10-CM | POA: Insufficient documentation

## 2010-11-22 DIAGNOSIS — R109 Unspecified abdominal pain: Secondary | ICD-10-CM | POA: Insufficient documentation

## 2010-11-22 DIAGNOSIS — Z79899 Other long term (current) drug therapy: Secondary | ICD-10-CM | POA: Insufficient documentation

## 2010-11-22 DIAGNOSIS — F3289 Other specified depressive episodes: Secondary | ICD-10-CM | POA: Insufficient documentation

## 2010-11-22 DIAGNOSIS — R55 Syncope and collapse: Secondary | ICD-10-CM | POA: Insufficient documentation

## 2010-11-22 DIAGNOSIS — F329 Major depressive disorder, single episode, unspecified: Secondary | ICD-10-CM | POA: Insufficient documentation

## 2010-11-22 LAB — DIFFERENTIAL
Basophils Absolute: 0 10*3/uL (ref 0.0–0.1)
Eosinophils Relative: 0 % (ref 0–5)
Lymphocytes Relative: 33 % (ref 12–46)
Neutro Abs: 2.2 10*3/uL (ref 1.7–7.7)
Neutrophils Relative %: 56 % (ref 43–77)

## 2010-11-22 LAB — CBC
HCT: 31 % — ABNORMAL LOW (ref 36.0–46.0)
Hemoglobin: 10.8 g/dL — ABNORMAL LOW (ref 12.0–15.0)
RBC: 3.6 MIL/uL — ABNORMAL LOW (ref 3.87–5.11)
RDW: 14.2 % (ref 11.5–15.5)
WBC: 4 10*3/uL (ref 4.0–10.5)

## 2010-11-22 LAB — URINE MICROSCOPIC-ADD ON

## 2010-11-22 LAB — RAPID URINE DRUG SCREEN, HOSP PERFORMED
Barbiturates: NOT DETECTED
Tetrahydrocannabinol: POSITIVE — AB

## 2010-11-22 LAB — BASIC METABOLIC PANEL
BUN: 13 mg/dL (ref 6–23)
Calcium: 8.4 mg/dL (ref 8.4–10.5)
Creatinine, Ser: 0.58 mg/dL (ref 0.50–1.10)
GFR calc non Af Amer: >60 mL/min (ref >60)
Glucose, Bld: 93 mg/dL (ref 70–99)

## 2010-11-22 LAB — URINALYSIS, ROUTINE W REFLEX MICROSCOPIC
Specific Gravity, Urine: 1.015 (ref 1.005–1.030)
Urobilinogen, UA: 1 mg/dL (ref 0.0–1.0)

## 2010-11-24 NOTE — H&P (Signed)
NAME:  Samantha [name], Samantha [name] NO.:  [id number]  MEDICAL RECORD NO.:  [id number]  LOCATION:  [facility]                          FACILITY:  [facility]  PHYSICIAN:  Samantha [name], M.D.    DATE OF BIRTH:  Jul 28, 1975  DATE OF ADMISSION:  10/29/2010 DATE OF DISCHARGE:  10/29/2010                             HISTORY & PHYSICAL   CHIEF COMPLAINT:  Gross hematuria and back pain.  HISTORY:  Samantha [name] is a 35 year old white female who was initially seen in consultation by Dr. Kari Baars for gross hematuria.  She has started seeing blood in the urine in November.  She had bilateral back pain, right worse than left, been severe since October 2011, pain is not relieved.  She had a CT urogram on March 18 which showed no stones.  She had undergone cystoscopy with retrograde pyelography about 2 weeks ago and was noted to have blood from the left ureteral orifice after completion of the retrograde pyelogram.  An Attempt that day to do ureteroscopy to evaluate source of blood was unsuccessful, so stent was left and she has returned today for repeat ureteroscopy.  PAST MEDICAL HISTORY:  Her past history is pertinent for anxiety, depression, reflux, heart disease, celiac disease, sinus arrhythmia.  SURGICAL HISTORY:  Pertinent for appendectomy, C-section x2, hand surgery, hysterectomy, laparoscopy, bilateral oophorectomy, tonsillectomy and umbilical hernia repair.  CURRENT MEDICATIONS:  Include Celexa, estradiol, verapamil, Xanax.  ALLERGIES:  She has no allergies.  FAMILY HISTORY:  Significant for thyroid disease.  SOCIAL HISTORY:  She drinks some alcohol.  She is divorced.  She was never smoker.  REVIEW OF SYSTEMS:  She has urinary frequency, dysuria, nocturia, some incontinence, intermittency.  Along with hematuria, she has had nausea, vomiting, heartburn, and diarrhea.  She has had some fever, night sweats, fatigue and 45-pound weight loss and celiac disease.  She has polydipsia.  She  has had back pain.  She has dizziness, headaches, depression, anxiety, but she is otherwise without complaints for checklist except as above.  PHYSICAL EXAMINATION:  VITAL SIGNS:  On exam her blood pressure 107/73, heart rate 71, temperature 96.1.  Weight is 174.  Height 5 feet 1 inch with BMI of 32. GENERAL:  She is a well-developed, well-nourished white female, in no acute distress. HEENT:  Normocephalic and atraumatic. NECK:  Supple without thyromegaly or bruits. LUNGS:  Clear.  Normal effort. HEART:  Regular rate and rhythm. ABDOMEN:  Soft, mildly obese without masses or hepatosplenomegaly.  She has mild suprapubic tenderness.  She does have some mild diffuse abdominal tenderness.  She has no hernias. GU EXAM: Reveals normal external genitalia.  No urethral or vaginal lesions but she did have some urethral hypermobility.  The vaginal epithelium is poorly estrogenized without atrophy.  Cervix, uterus are absent.  Adnexa unremarkable.  Bladder tender but normal.  On palpation she has no femoral or inguinal adenopathy. SKIN:  Warm and dry. NEUROPSYCH:  Mood and affect are appropriate.  IMPRESSION:  Hematuria, possibly from the left renal moiety.  PLAN:  She will undergo stent removal and ureteroscopy today.     Samantha [name], M.D.     JJW/MEDQ  D:  11/04/2010  T:  11/04/2010  Job:  [id number]  cc:   Oneal Deputy. Juanetta Gosling, M.D. Fax: [phone]  Electronically Signed by Bjorn Pippin M.D. on 11/24/2010 12:08:12 PM

## 2010-12-16 ENCOUNTER — Encounter: Payer: Self-pay | Admitting: Emergency Medicine

## 2010-12-16 ENCOUNTER — Emergency Department (HOSPITAL_COMMUNITY): Payer: Medicaid Other

## 2010-12-16 ENCOUNTER — Emergency Department (HOSPITAL_COMMUNITY)
Admission: EM | Admit: 2010-12-16 | Discharge: 2010-12-16 | Disposition: A | Discharge status: Home / Self Care | Payer: Medicaid Other | Attending: Emergency Medicine | Admitting: Emergency Medicine

## 2010-12-16 DIAGNOSIS — S9000XA Contusion of unspecified ankle, initial encounter: Secondary | ICD-10-CM | POA: Insufficient documentation

## 2010-12-16 DIAGNOSIS — W010XXA Fall on same level from slipping, tripping and stumbling without subsequent striking against object, initial encounter: Secondary | ICD-10-CM

## 2010-12-16 DIAGNOSIS — S60219A Contusion of unspecified wrist, initial encounter: Secondary | ICD-10-CM | POA: Insufficient documentation

## 2010-12-16 DIAGNOSIS — R296 Repeated falls: Secondary | ICD-10-CM | POA: Insufficient documentation

## 2010-12-16 DIAGNOSIS — T148XXA Other injury of unspecified body region, initial encounter: Secondary | ICD-10-CM

## 2010-12-16 DIAGNOSIS — F17200 Nicotine dependence, unspecified, uncomplicated: Secondary | ICD-10-CM | POA: Insufficient documentation

## 2010-12-16 DIAGNOSIS — Y92009 Unspecified place in unspecified non-institutional (private) residence as the place of occurrence of the external cause: Secondary | ICD-10-CM | POA: Insufficient documentation

## 2010-12-16 HISTORY — DX: Celiac disease: K90.0

## 2010-12-16 MED ORDER — HYDROCODONE-ACETAMINOPHEN 5-325 MG PO TABS: 1.0000 | ORAL_TABLET | ORAL | Status: AC | PRN

## 2010-12-16 NOTE — ED Notes (Signed)
Pt ambulatory to restroom, delay explained to pt

## 2010-12-16 NOTE — ED Provider Notes (Signed)
History     Chief Complaint  Patient presents with  . Fall  . Ankle Pain   HPI Comments: Patient also c/o pain to her wrist wrist.  States the dog's leash twisted her wrist she she fell.  She denies head injury, numbness, weakness or LOC.    Patient is a 35 y.o. female presenting with ankle pain. The history is provided by the patient.  Ankle Pain  The incident occurred 12 to 24 hours ago. The incident occurred at home. The injury mechanism was a fall and torsion. The pain is present in the left ankle. The quality of the pain is described as aching. The pain is mild. The pain has been constant since onset. Pertinent negatives include no numbness, no inability to bear weight, no loss of motion, no muscle weakness, no loss of sensation and no tingling. She reports no foreign bodies present. The symptoms are aggravated by activity, bearing weight and palpation. She has tried NSAIDs for the symptoms. The treatment provided no relief.    Past Medical History  Diagnosis Date  . Celiac disease     Past Surgical History  Procedure Date  . Cesarean section   . Tonsillectomy   . Appendectomy   . Abdominal hysterectomy   . Hernia repair   . Adenoidectomy     History reviewed. No pertinent family history.  History  Substance Use Topics  . Smoking status: Current Some Day Smoker -- 0.5 packs/day  . Smokeless tobacco: Not on file  . Alcohol Use: No    OB History    Grav Para Term Preterm Abortions TAB SAB Ect Mult Living                  Review of Systems  Constitutional: Negative for fever and chills.  HENT: Negative for neck pain and neck stiffness.   Respiratory: Negative for chest tightness and shortness of breath.   Cardiovascular: Negative.   Musculoskeletal: Positive for arthralgias. Negative for myalgias and back pain.  Skin: Negative.   Neurological: Negative for dizziness, tingling, weakness and numbness.  Hematological: Does not bruise/bleed easily.    Physical  Exam  BP 110/61  Pulse 83  Temp(Src) 98.5 F (36.9 C) (Oral)  Resp 18  Ht 5\' 1"  (1.549 m)  Wt 173 lb (78.472 kg)  BMI 32.69 kg/m2  SpO2 100%  Physical Exam  Nursing note and vitals reviewed. Constitutional: She is oriented to person, place, and time. She appears well-developed and well-nourished.  HENT:  Head: Atraumatic.  Neck: Normal range of motion.  Cardiovascular: Normal rate, regular rhythm and normal heart sounds.   Pulmonary/Chest: Effort normal and breath sounds normal.  Musculoskeletal: She exhibits edema and tenderness.       Right wrist: She exhibits tenderness and swelling. She exhibits no crepitus, no deformity and no laceration.       Left ankle: She exhibits swelling. She exhibits no deformity, no laceration and normal pulse. tenderness. Lateral malleolus tenderness found. Achilles tendon normal.  Neurological: She is alert and oriented to person, place, and time. She exhibits normal muscle tone. Coordination normal.  Skin: Skin is warm and dry.    ED Course  Procedures  MDM  Patient moves all extremities.  Pain reproduced to left ankle with flexion.  No obvious bruising or edema.  DP pulse is strong, sensation intact.  Right wrist is ttp w/o edema or bruising.  Splint applied to left ankle and right wrist by the nursing staff.  NV intact  after application.  Pt agrees to f/u with ortho.  I have discussed pt results with her prior to d/c.          Rosaisela Jamroz L. Pradyun Ishman, [place] 12/22/10 1544

## 2010-12-16 NOTE — ED Notes (Signed)
Called xray on status of films, advised that they would check on delay in xrays being read

## 2010-12-16 NOTE — ED Notes (Signed)
Pt states her dog dragged her through the yard last night injuring her left ankle.

## 2010-12-16 NOTE — ED Notes (Signed)
Pt states that she was walking her dog (approx. 90lbs) when the dog started to run and dragged her, pt has scrapes to right arm, left abd area, pain to right hand, wrist area, pain to left ankle, cms intact all extremities, swelling, bruising  noted to right wrist,

## 2010-12-20 NOTE — ED Provider Notes (Signed)
History     Chief Complaint  Patient presents with  . Fall  . Ankle Pain   HPI  Past Medical History  Diagnosis Date  . Celiac disease     Past Surgical History  Procedure Date  . Cesarean section   . Tonsillectomy   . Appendectomy   . Abdominal hysterectomy   . Hernia repair   . Adenoidectomy     History reviewed. No pertinent family history.  History  Substance Use Topics  . Smoking status: Current Some Day Smoker -- 0.5 packs/day  . Smokeless tobacco: Not on file  . Alcohol Use: No    OB History    Grav Para Term Preterm Abortions TAB SAB Ect Mult Living                  Review of Systems  Physical Exam  BP 110/61  Pulse 83  Temp(Src) 98.5 F (36.9 C) (Oral)  Resp 18  Ht 5\' 1"  (1.549 m)  Wt 173 lb (78.472 kg)  BMI 32.69 kg/m2  SpO2 100%  Physical Exam  ED Course  Procedures  MDM  Medical screening examination/treatment/procedure(s) were performed by non-physician practitioner and as supervising physician I was immediately available for consultation/collaboration.       Benny Lennert, MD 12/20/10 1444

## 2011-01-02 ENCOUNTER — Emergency Department (HOSPITAL_COMMUNITY)
Admission: EM | Admit: 2011-01-02 | Discharge: 2011-01-03 | Disposition: A | Discharge status: Home / Self Care | Payer: Medicaid Other | Attending: Emergency Medicine | Admitting: Emergency Medicine

## 2011-01-02 ENCOUNTER — Encounter (HOSPITAL_COMMUNITY): Payer: Self-pay | Admitting: *Deleted

## 2011-01-02 ENCOUNTER — Emergency Department (HOSPITAL_COMMUNITY): Payer: Medicaid Other

## 2011-01-02 DIAGNOSIS — F17200 Nicotine dependence, unspecified, uncomplicated: Secondary | ICD-10-CM | POA: Insufficient documentation

## 2011-01-02 DIAGNOSIS — R109 Unspecified abdominal pain: Secondary | ICD-10-CM | POA: Insufficient documentation

## 2011-01-02 DIAGNOSIS — Z87442 Personal history of urinary calculi: Secondary | ICD-10-CM | POA: Insufficient documentation

## 2011-01-02 DIAGNOSIS — R110 Nausea: Secondary | ICD-10-CM | POA: Insufficient documentation

## 2011-01-02 DIAGNOSIS — R319 Hematuria, unspecified: Secondary | ICD-10-CM | POA: Insufficient documentation

## 2011-01-02 HISTORY — DX: Calculus of kidney: N20.0

## 2011-01-02 LAB — URINALYSIS, ROUTINE W REFLEX MICROSCOPIC
Glucose, UA: NEGATIVE mg/dL
Ketones, ur: NEGATIVE mg/dL
Leukocytes, UA: NEGATIVE
Protein, ur: NEGATIVE mg/dL

## 2011-01-02 LAB — CBC
HCT: 35.5 % — ABNORMAL LOW (ref 36.0–46.0)
Hemoglobin: 12.5 g/dL (ref 12.0–15.0)
WBC: 4.2 10*3/uL (ref 4.0–10.5)

## 2011-01-02 LAB — BASIC METABOLIC PANEL
Chloride: 102 mEq/L (ref 96–112)
GFR calc Af Amer: >60 mL/min (ref >60)
Potassium: 3.7 mEq/L (ref 3.5–5.1)
Sodium: 136 mEq/L (ref 135–145)

## 2011-01-02 LAB — URINE MICROSCOPIC-ADD ON

## 2011-01-02 LAB — DIFFERENTIAL
Basophils Absolute: 0 10*3/uL (ref 0.0–0.1)
Lymphocytes Relative: 42 % (ref 12–46)
Monocytes Absolute: 0.3 10*3/uL (ref 0.1–1.0)
Monocytes Relative: 7 % (ref 3–12)
Neutro Abs: 2 10*3/uL (ref 1.7–7.7)

## 2011-01-02 MED ORDER — HYDROMORPHONE HCL 1 MG/ML IJ SOLN
1.0000 mg | Freq: Once | INTRAMUSCULAR | Status: AC
  Administered 2011-01-02: 1 mg via INTRAVENOUS
  Filled 2011-01-02: qty 1

## 2011-01-02 MED ORDER — ONDANSETRON HCL 4 MG/2ML IJ SOLN
4.0000 mg | Freq: Once | INTRAMUSCULAR | Status: AC
  Administered 2011-01-02: 4 mg via INTRAVENOUS
  Filled 2011-01-02: qty 2

## 2011-01-02 MED ORDER — SODIUM CHLORIDE 0.9 % IV SOLN
Freq: Once | INTRAVENOUS | Status: AC
  Administered 2011-01-02: 1000 mL via INTRAVENOUS

## 2011-01-02 NOTE — ED Notes (Signed)
Pt c/o right sided flank pain with blood in urine. Pt states pain started 3 days ago

## 2011-01-02 NOTE — ED Notes (Signed)
Pt states surgery on left kidney for stones in June. Has had no complications from surgery. Called pcp was unable to get appointment.

## 2011-01-02 NOTE — ED Notes (Signed)
Pt advised she is awaiting a ct scan & they should be up soon to carry her down for scan.

## 2011-01-02 NOTE — ED Notes (Signed)
Patient is comfortable wanted me to turn off her lights. Patient stated her IV quit flowing RN Ron notified.

## 2011-01-03 MED ORDER — OXYCODONE-ACETAMINOPHEN 5-325 MG PO TABS
2.0000 | ORAL_TABLET | Freq: Once | ORAL | Status: AC
  Administered 2011-01-03: 2 via ORAL
  Filled 2011-01-03: qty 2

## 2011-01-03 MED ORDER — PROMETHAZINE HCL 25 MG PO TABS: 25.0000 mg | ORAL_TABLET | Freq: Four times a day (QID) | ORAL | Status: DC | PRN

## 2011-01-03 MED ORDER — OXYCODONE-ACETAMINOPHEN 5-325 MG PO TABS: 1.0000 | ORAL_TABLET | ORAL | Status: AC | PRN

## 2011-01-03 NOTE — ED Provider Notes (Signed)
History     CSN: [id number] Arrival date & time: 01/02/2011  6:40 PM  Chief Complaint  Patient presents with  . Flank Pain  . Hematuria   Patient is a 35 y.o. female presenting with flank pain. The history is provided by the patient.  Flank Pain This is a recurrent problem. Episode onset: 3 days ago. The problem occurs constantly. The problem has been unchanged. Associated symptoms include nausea and urinary symptoms. Pertinent negatives include no abdominal pain, arthralgias, chest pain, congestion, diaphoresis, fever, headaches, joint swelling, neck pain, numbness, rash, sore throat, vertigo, vomiting or weakness. The symptoms are aggravated by nothing. She has tried NSAIDs and acetaminophen for the symptoms. The treatment provided no relief.  Flank Pain This is a recurrent problem. Episode onset: 3 days ago. The problem occurs constantly. The problem has been unchanged. Pertinent negatives include no chest pain, no abdominal pain, no headaches and no shortness of breath. The symptoms are aggravated by nothing. She has tried NSAIDs and acetaminophen for the symptoms. The treatment provided no relief.    Past Medical History  Diagnosis Date  . Celiac disease   . Kidney stones     Past Surgical History  Procedure Date  . Cesarean section   . Tonsillectomy   . Appendectomy   . Abdominal hysterectomy   . Hernia repair   . Adenoidectomy     History reviewed. No pertinent family history.  History  Substance Use Topics  . Smoking status: Current Some Day Smoker -- 0.5 packs/day  . Smokeless tobacco: Not on file  . Alcohol Use: No    OB History    Grav Para Term Preterm Abortions TAB SAB Ect Mult Living                  Review of Systems  Constitutional: Negative for fever and diaphoresis.  HENT: Negative for congestion, sore throat and neck pain.   Eyes: Negative.   Respiratory: Negative for chest tightness and shortness of breath.   Cardiovascular: Negative for chest  pain.  Gastrointestinal: Positive for nausea. Negative for vomiting and abdominal pain.  Genitourinary: Positive for hematuria and flank pain. Negative for urgency, vaginal bleeding, vaginal pain and pelvic pain.  Musculoskeletal: Negative for joint swelling and arthralgias.  Skin: Negative.  Negative for rash and wound.  Neurological: Negative for dizziness, vertigo, weakness, light-headedness, numbness and headaches.  Hematological: Negative.   Psychiatric/Behavioral: Negative.     Physical Exam  BP 95/64  Pulse 81  Resp 17  SpO2 97%  Physical Exam  Vitals reviewed. Constitutional: She is oriented to person, place, and time. She appears well-developed and well-nourished.  HENT:  Head: Normocephalic and atraumatic.  Eyes: Conjunctivae are normal.  Neck: Normal range of motion.  Cardiovascular: Normal rate, regular rhythm, normal heart sounds and intact distal pulses.   Pulmonary/Chest: Effort normal and breath sounds normal. She has no wheezes.  Abdominal: Soft. Bowel sounds are normal. She exhibits no distension and no mass. There is CVA tenderness. There is no rebound and no guarding.       cva ttp left flank.  Musculoskeletal: Normal range of motion.  Neurological: She is alert and oriented to person, place, and time.  Skin: Skin is warm and dry.  Psychiatric: She has a normal mood and affect.    ED Course  Procedures  MDM  Negative CT scan with hematuria,  otw normal labs.  Patients labs and/or radiological studies were reviewed during the medical decision making and disposition  process.  Previous admission records also reviewed.      Candis Musa, PA 01/03/11 0139  Medical screening examination/treatment/procedure(s) were performed by non-physician practitioner and as supervising physician I was immediately available for consultation/collaboration.  Nicoletta Dress. Colon Branch, MD 01/11/11 [phone]

## 2011-01-06 NOTE — ED Provider Notes (Signed)
Medical screening examination/treatment/procedure(s) were performed by non-physician practitioner and as supervising physician I was immediately available for consultation/collaboration.   Benny Lennert, MD 01/06/11 [phone]

## 2011-01-10 ENCOUNTER — Encounter (HOSPITAL_COMMUNITY): Payer: Self-pay | Admitting: *Deleted

## 2011-01-10 ENCOUNTER — Emergency Department (HOSPITAL_COMMUNITY)
Admission: EM | Admit: 2011-01-10 | Discharge: 2011-01-10 | Disposition: A | Discharge status: Home / Self Care | Payer: Medicaid Other | Attending: Emergency Medicine | Admitting: Emergency Medicine

## 2011-01-10 DIAGNOSIS — N3010 Interstitial cystitis (chronic) without hematuria: Secondary | ICD-10-CM | POA: Insufficient documentation

## 2011-01-10 DIAGNOSIS — Z87442 Personal history of urinary calculi: Secondary | ICD-10-CM | POA: Insufficient documentation

## 2011-01-10 DIAGNOSIS — F17200 Nicotine dependence, unspecified, uncomplicated: Secondary | ICD-10-CM | POA: Insufficient documentation

## 2011-01-10 HISTORY — DX: Interstitial cystitis (chronic) without hematuria: N30.10

## 2011-01-10 LAB — URINALYSIS, ROUTINE W REFLEX MICROSCOPIC
Leukocytes, UA: NEGATIVE
Nitrite: NEGATIVE
Protein, ur: NEGATIVE mg/dL
Specific Gravity, Urine: >1.03 — ABNORMAL HIGH (ref 1.005–1.030)
Urobilinogen, UA: 0.2 mg/dL (ref 0.0–1.0)

## 2011-01-10 LAB — URINE MICROSCOPIC-ADD ON

## 2011-01-10 MED ORDER — SODIUM CHLORIDE 0.9 % IV BOLUS (SEPSIS)
1000.0000 mL | Freq: Once | INTRAVENOUS | Status: AC
  Administered 2011-01-10: 1000 mL via INTRAVENOUS

## 2011-01-10 MED ORDER — HYDROMORPHONE HCL 1 MG/ML IJ SOLN
1.0000 mg | Freq: Once | INTRAMUSCULAR | Status: AC
  Administered 2011-01-10: 1 mg via INTRAVENOUS
  Filled 2011-01-10: qty 1

## 2011-01-10 MED ORDER — OXYCODONE-ACETAMINOPHEN 7.5-325 MG PO TABS: 1.0000 | ORAL_TABLET | ORAL | Status: AC | PRN

## 2011-01-10 MED ORDER — ONDANSETRON HCL 4 MG/2ML IJ SOLN
4.0000 mg | Freq: Once | INTRAMUSCULAR | Status: AC
  Administered 2011-01-10: 4 mg via INTRAVENOUS
  Filled 2011-01-10: qty 2

## 2011-01-10 MED ORDER — METHYLPREDNISOLONE SODIUM SUCC 125 MG IJ SOLR
125.0000 mg | Freq: Once | INTRAMUSCULAR | Status: AC
  Administered 2011-01-10: 125 mg via INTRAVENOUS
  Filled 2011-01-10: qty 2

## 2011-01-10 MED ORDER — SODIUM CHLORIDE 0.9 % IV BOLUS (SEPSIS)
1000.0000 mL | Freq: Once | INTRAVENOUS | Status: AC
  Administered 2011-01-10 (×2): 1000 mL via INTRAVENOUS

## 2011-01-10 MED ORDER — PHENAZOPYRIDINE HCL 100 MG PO TABS
200.0000 mg | ORAL_TABLET | Freq: Once | ORAL | Status: AC
  Administered 2011-01-10: 200 mg via ORAL
  Filled 2011-01-10: qty 2

## 2011-01-10 MED ORDER — PREDNISONE 20 MG PO TABS: 60.0000 mg | ORAL_TABLET | Freq: Every day | ORAL | Status: AC

## 2011-01-10 NOTE — ED Provider Notes (Signed)
History     CSN: [id number] Arrival date & time: 01/10/2011 12:05 PM  Chief Complaint  Patient presents with  . Hematuria   Patient is a 35 y.o. female presenting with hematuria. The history is provided by the patient.  Hematuria This is a recurrent problem. The current episode started in the past 7 days. She describes the hematuria as gross hematuria. The hematuria occurs throughout her entire urinary stream. She reports no clotting in her urine stream. Her pain is at a severity of 10/10. The pain is severe. She describes her urine color as light pink. Irritative symptoms include frequency. Irritative symptoms do not include urgency. Associated symptoms include abdominal pain and flank pain. Pertinent negatives include no fever or nausea. Her past medical history is significant for kidney stones. (Interstitial cystitis)    Past Medical History  Diagnosis Date  . Celiac disease   . Kidney stones   . Kidney stones   . Interstitial cystitis     Past Surgical History  Procedure Date  . Cesarean section   . Tonsillectomy   . Appendectomy   . Abdominal hysterectomy   . Hernia repair   . Adenoidectomy   . Kidney stone surgery     History reviewed. No pertinent family history.  History  Substance Use Topics  . Smoking status: Current Some Day Smoker -- 0.5 packs/day  . Smokeless tobacco: Not on file  . Alcohol Use: No    OB History    Grav Para Term Preterm Abortions TAB SAB Ect Mult Living                  Review of Systems  Constitutional: Negative for fever.  HENT: Negative for congestion, sore throat and neck pain.   Eyes: Negative.   Respiratory: Negative for chest tightness and shortness of breath.   Cardiovascular: Negative for chest pain.  Gastrointestinal: Positive for abdominal pain. Negative for nausea.  Genitourinary: Positive for frequency, hematuria and flank pain. Negative for urgency, vaginal bleeding and vaginal discharge.  Musculoskeletal: Negative  for joint swelling and arthralgias.  Skin: Negative.  Negative for rash and wound.  Neurological: Negative for dizziness, weakness, light-headedness, numbness and headaches.  Hematological: Negative.  Does not bruise/bleed easily.  Psychiatric/Behavioral: Negative.     Physical Exam  BP 106/60  Pulse 74  Temp(Src) 98.6 F (37 C) (Oral)  Resp 18  Ht 5\' 1"  (1.549 m)  Wt 173 lb (78.472 kg)  BMI 32.69 kg/m2  SpO2 100%  Physical Exam  Nursing note and vitals reviewed. Constitutional: She is oriented to person, place, and time. She appears well-developed and well-nourished.       Appears uncomfortable.  HENT:  Head: Normocephalic and atraumatic.  Eyes: Conjunctivae are normal.  Neck: Normal range of motion.  Cardiovascular: Normal rate, regular rhythm, normal heart sounds and intact distal pulses.   Pulmonary/Chest: Effort normal and breath sounds normal. She has no wheezes.  Abdominal: Soft. Bowel sounds are normal. She exhibits no mass. There is tenderness. There is no rebound and no guarding.       Suprapubic ttp.  Musculoskeletal: Normal range of motion.       Bilateral low back soreness.  No midline pain.  Neurological: She is alert and oriented to person, place, and time.  Skin: Skin is warm and dry.  Psychiatric: She has a normal mood and affect.    ED Course  Procedures  MDM Previous chart,  CT scan, labs reviewed.  Patient with chronic interstitial  cystitis with flare up,  No signs,  Sx of infection.  Ct from 12/31/10 neg for stones or other source of pain.  Scheduled to see Dr Annabell Howells 3rd week of Sept.  Encouraged to get on cancellation list.  Patient discussed with Dr. Adriana Simas prior to dc home.      Candis Musa, PA 01/10/11 1907

## 2011-01-10 NOTE — ED Notes (Signed)
Pt c/o blood in urine and unable to sleep x 2 days due to pain; pt has hx of kidney stones

## 2011-01-10 NOTE — ED Notes (Signed)
C/o nausea and hematuria, states she hash been sick for several days and has a history of kidney stones

## 2011-01-14 ENCOUNTER — Encounter: Payer: Self-pay | Admitting: Internal Medicine

## 2011-01-14 NOTE — Progress Notes (Signed)
  This lady has celiac disease. She needs followup with [place]. She is noncompliant. Would send her one more letter requesting she schedule (and keep) her appointment with [place]

## 2011-01-15 ENCOUNTER — Encounter: Payer: Self-pay | Admitting: Internal Medicine

## 2011-01-15 NOTE — Progress Notes (Signed)
LETTER MAILED

## 2011-01-17 ENCOUNTER — Emergency Department (HOSPITAL_COMMUNITY): Payer: Medicaid Other

## 2011-01-17 ENCOUNTER — Emergency Department (HOSPITAL_COMMUNITY)
Admission: EM | Admit: 2011-01-17 | Discharge: 2011-01-17 | Disposition: A | Discharge status: Home / Self Care | Payer: Medicaid Other | Attending: Emergency Medicine | Admitting: Emergency Medicine

## 2011-01-17 ENCOUNTER — Encounter (HOSPITAL_COMMUNITY): Payer: Self-pay | Admitting: *Deleted

## 2011-01-17 DIAGNOSIS — Z87442 Personal history of urinary calculi: Secondary | ICD-10-CM | POA: Insufficient documentation

## 2011-01-17 DIAGNOSIS — R319 Hematuria, unspecified: Secondary | ICD-10-CM | POA: Insufficient documentation

## 2011-01-17 DIAGNOSIS — F17200 Nicotine dependence, unspecified, uncomplicated: Secondary | ICD-10-CM | POA: Insufficient documentation

## 2011-01-17 DIAGNOSIS — N390 Urinary tract infection, site not specified: Secondary | ICD-10-CM | POA: Insufficient documentation

## 2011-01-17 LAB — URINALYSIS, ROUTINE W REFLEX MICROSCOPIC
Bilirubin Urine: NEGATIVE
Glucose, UA: NEGATIVE mg/dL
Ketones, ur: NEGATIVE mg/dL
Leukocytes, UA: NEGATIVE
Nitrite: NEGATIVE
Protein, ur: NEGATIVE mg/dL
Specific Gravity, Urine: 1.025 (ref 1.005–1.030)
Urobilinogen, UA: 0.2 mg/dL (ref 0.0–1.0)
pH: 6 (ref 5.0–8.0)

## 2011-01-17 LAB — COMPREHENSIVE METABOLIC PANEL WITH GFR
ALT: 11 U/L (ref 0–35)
AST: 12 U/L (ref 0–37)
Albumin: 4.1 g/dL (ref 3.5–5.2)
Alkaline Phosphatase: 60 U/L (ref 39–117)
BUN: 10 mg/dL (ref 6–23)
CO2: 28 meq/L (ref 19–32)
Calcium: 9.1 mg/dL (ref 8.4–10.5)
Chloride: 101 meq/L (ref 96–112)
Creatinine, Ser: 0.54 mg/dL (ref 0.50–1.10)
GFR calc Af Amer: >60 mL/min (ref >60)
GFR calc non Af Amer: >60 mL/min (ref >60)
Glucose, Bld: 97 mg/dL (ref 70–99)
Potassium: 3.9 meq/L (ref 3.5–5.1)
Sodium: 137 meq/L (ref 135–145)
Total Bilirubin: 0.3 mg/dL (ref 0.3–1.2)
Total Protein: 7.4 g/dL (ref 6.0–8.3)

## 2011-01-17 LAB — URINE MICROSCOPIC-ADD ON

## 2011-01-17 LAB — CBC
HCT: 37 % (ref 36.0–46.0)
Platelets: 208 10*3/uL (ref 150–400)
RBC: 4.27 MIL/uL (ref 3.87–5.11)
RDW: 13.1 % (ref 11.5–15.5)
WBC: 5.2 10*3/uL (ref 4.0–10.5)

## 2011-01-17 LAB — DIFFERENTIAL
Basophils Absolute: 0 K/uL (ref 0.0–0.1)
Basophils Relative: 0 % (ref 0–1)
Eosinophils Absolute: 0.1 K/uL (ref 0.0–0.7)
Eosinophils Relative: 3 % (ref 0–5)
Lymphocytes Relative: 35 % (ref 12–46)
Lymphs Abs: 1.8 K/uL (ref 0.7–4.0)
Monocytes Absolute: 0.4 K/uL (ref 0.1–1.0)
Monocytes Relative: 8 % (ref 3–12)
Neutro Abs: 2.9 K/uL (ref 1.7–7.7)
Neutrophils Relative %: 55 % (ref 43–77)

## 2011-01-17 LAB — LIPASE, BLOOD: Lipase: 49 U/L (ref 11–59)

## 2011-01-17 MED ORDER — ONDANSETRON HCL 4 MG/2ML IJ SOLN
4.0000 mg | Freq: Once | INTRAMUSCULAR | Status: AC
  Administered 2011-01-17: 4 mg via INTRAVENOUS
  Filled 2011-01-17: qty 2

## 2011-01-17 MED ORDER — CIPROFLOXACIN HCL 250 MG PO TABS
500.0000 mg | ORAL_TABLET | Freq: Once | ORAL | Status: AC
  Administered 2011-01-17: 500 mg via ORAL
  Filled 2011-01-17: qty 2

## 2011-01-17 MED ORDER — SODIUM CHLORIDE 0.9 % IV BOLUS (SEPSIS)
1000.0000 mL | Freq: Once | INTRAVENOUS | Status: AC
  Administered 2011-01-17: 1000 mL via INTRAVENOUS

## 2011-01-17 MED ORDER — CIPROFLOXACIN HCL 500 MG PO TABS: 500.0000 mg | ORAL_TABLET | Freq: Two times a day (BID) | ORAL | Status: AC

## 2011-01-17 MED ORDER — METOCLOPRAMIDE HCL 5 MG/ML IJ SOLN
10.0000 mg | Freq: Once | INTRAMUSCULAR | Status: AC
  Administered 2011-01-17: 10 mg via INTRAVENOUS
  Filled 2011-01-17: qty 2

## 2011-01-17 NOTE — ED Notes (Signed)
Pt c/o lower back pain and blood in her urine for 3 weeks. Pt seen in ED on 01/10/11 for same but states that it is not getting any better. Has an appt with urologist on 02/13/11.

## 2011-01-17 NOTE — ED Provider Notes (Signed)
History  Scribed for Dr. Rosalia Hammers, the patient was seen in room 6. The chart was scribed by Gilman Schmidt. The patients care was started at 1516.  CSN: [id number] Arrival date & time: 01/17/2011  2:05 PM  Chief Complaint  Patient presents with  . Back Pain   HPI  Samantha [name] is a 35 y.o. female with a history of Pancreatitis, Interstitial Cystitis, Kidney Stones, and Celiac Disease who presents to the Emergency Department complaining of constant radiating lower back pain and hematuria that "feels like a kidney stone". The patient describes the lower back pain as a dull pain (5/10) that radiates to a sharp pain (7/10) lasting for two or three minutes.  The patients reports that she has has hematuria for the past three weeks. The patients also reports symptoms of nausea and vomiting that occurs whenever she eats. Additionally, the patient reports urine frequency, that was followed by decreased urine frequency.  PCP: Dr. Juanetta Gosling Pain radiates to the right flank and ruq HPI ELEMENTS:  Location: lower back Onset: three weeks ago Duration: consistent since onset Timing: intermittent (lasting for three minutes) Quality: sharp Context: as above  Associated symptoms: nausea and vomiting   PAST MEDICAL HISTORY:  Past Medical History  Diagnosis Date  . Celiac disease   . Kidney stones   . Kidney stones   . Interstitial cystitis     PAST SURGICAL HISTORY:  Past Surgical History  Procedure Date  . Cesarean section   . Tonsillectomy   . Appendectomy   . Abdominal hysterectomy   . Hernia repair   . Adenoidectomy   . Kidney stone surgery     MEDICATIONS:  Previous Medications   ALPRAZOLAM (XANAX) 0.25 MG TABLET    Take 0.25 mg by mouth at bedtime as needed.     ALPRAZOLAM (XANAX) 0.5 MG TABLET    Take 0.5 mg by mouth 4 (four) times daily as needed. For anxiety   CITALOPRAM (CELEXA) 20 MG TABLET    Take 20 mg by mouth daily.     ESTRADIOL (ESTRACE) 1 MG TABLET    Take 1 mg by mouth daily.      ESTROGENS, CONJUGATED, (PREMARIN) 0.9 MG TABLET    Take 0.9 mg by mouth daily. Take daily for 21 days then do not take for 7 days.    IBUPROFEN (ADVIL,MOTRIN) 200 MG TABLET    Take 200 mg by mouth every 6 (six) hours as needed. For pain    OXYCODONE-ACETAMINOPHEN (PERCOCET) 7.5-325 MG PER TABLET    Take 1 tablet by mouth every 4 (four) hours as needed for pain.   VERAPAMIL (CALAN-SR) 120 MG CR TABLET    Take 120 mg by mouth at bedtime.      ALLERGIES:  Allergies as of 01/17/2011 - Review Complete 01/17/2011  Allergen Reaction Noted  . Gluten Other (See Comments) 01/02/2011     FAMILY HISTORY:  Son had liver transplant at age 14 History reviewed. No pertinent family history.   SOCIAL HISTORY: History  Substance Use Topics  . Smoking status: Current Some Day Smoker -- 0.5 packs/day  . Smokeless tobacco: Not on file  . Alcohol Use: No    Review of Systems  Constitutional: Positive for appetite change (hasn't eaten in 2 days).  Gastrointestinal: Positive for nausea and vomiting.  Genitourinary: Positive for hematuria. Negative for vaginal discharge.  Musculoskeletal: Positive for back pain.  All other systems reviewed and are negative.    Physical Exam  BP 130/81  Pulse  82  Temp(Src) 98.1 F (36.7 C) (Oral)  Resp 16  Ht 5\' 1"  (1.549 m)  Wt 173 lb (78.472 kg)  BMI 32.69 kg/m2  SpO2 100%  Physical Exam  Constitutional: She is oriented to person, place, and time. She appears well-developed and well-nourished.  HENT:  Head: Normocephalic and atraumatic.  Right Ear: External ear normal.  Left Ear: External ear normal.  Eyes: EOM are normal. Pupils are equal, round, and reactive to light.  Neck: Normal range of motion. Neck supple. No tracheal deviation present. No thyromegaly present.  Cardiovascular: Normal rate, regular rhythm and normal heart sounds.   Pulmonary/Chest: Effort normal and breath sounds normal.  Abdominal: There is tenderness (RUQ).  Musculoskeletal:  Normal range of motion. She exhibits tenderness (bilateral para-spinal tenderness upon palpitation ).  Lymphadenopathy:    She has no cervical adenopathy.  Neurological: She is alert and oriented to person, place, and time.  Skin: Skin is warm and dry.  Psychiatric: She has a normal mood and affect. Her behavior is normal. Thought content normal.     OTHER DATA REVIEWED: Nursing notes, vital signs, and past medical records reviewed.   DIAGNOSTIC STUDIES: Oxygen Saturation is 100% on room air, normal by my interpretation.     LABS:  Results for orders placed during the hospital encounter of 01/17/11  URINALYSIS, ROUTINE W REFLEX MICROSCOPIC      Component Value Range   Color, Urine YELLOW  YELLOW    Appearance HAZY (*) CLEAR    Specific Gravity, Urine 1.025  1.005 - 1.030    pH 6.0  5.0 - 8.0    Glucose, UA NEGATIVE  NEGATIVE (mg/dL)   Hgb urine dipstick LARGE (*) NEGATIVE    Bilirubin Urine NEGATIVE  NEGATIVE    Ketones, ur NEGATIVE  NEGATIVE (mg/dL)   Protein, ur NEGATIVE  NEGATIVE (mg/dL)   Urobilinogen, UA 0.2  0.0 - 1.0 (mg/dL)   Nitrite NEGATIVE  NEGATIVE    Leukocytes, UA NEGATIVE  NEGATIVE   PREGNANCY, URINE      Component Value Range   Preg Test, Ur NEGATIVE    URINE MICROSCOPIC-ADD ON      Component Value Range   Squamous Epithelial / LPF FEW (*) RARE    WBC, UA 0-2  <3 (WBC/hpf)   RBC / HPF TOO NUMEROUS TO COUNT  <3 (RBC/hpf)   Bacteria, UA RARE  RARE   CBC      Component Value Range   WBC 5.2  4.0 - 10.5 (K/uL)   RBC 4.27  3.87 - 5.11 (MIL/uL)   Hemoglobin 12.9  12.0 - 15.0 (g/dL)   HCT 16.1  09.6 - 04.5 (%)   MCV 86.7  78.0 - 100.0 (fL)   MCH 30.2  26.0 - 34.0 (pg)   MCHC 34.9  30.0 - 36.0 (g/dL)   RDW 40.9  81.1 - 91.4 (%)   Platelets 208  150 - 400 (K/uL)  DIFFERENTIAL      Component Value Range   Neutrophils Relative 55  43 - 77 (%)   Neutro Abs 2.9  1.7 - 7.7 (K/uL)   Lymphocytes Relative 35  12 - 46 (%)   Lymphs Abs 1.8  0.7 - 4.0 (K/uL)    Monocytes Relative 8  3 - 12 (%)   Monocytes Absolute 0.4  0.1 - 1.0 (K/uL)   Eosinophils Relative 3  0 - 5 (%)   Eosinophils Absolute 0.1  0.0 - 0.7 (K/uL)   Basophils Relative 0  0 - 1 (%)   Basophils Absolute 0.0  0.0 - 0.1 (K/uL)  COMPREHENSIVE METABOLIC PANEL      Component Value Range   Sodium 137  135 - 145 (mEq/L)   Potassium 3.9  3.5 - 5.1 (mEq/L)   Chloride 101  96 - 112 (mEq/L)   CO2 28  19 - 32 (mEq/L)   Glucose, Bld 97  70 - 99 (mg/dL)   BUN 10  6 - 23 (mg/dL)   Creatinine, Ser 4.09  0.50 - 1.10 (mg/dL)   Calcium 9.1  8.4 - 81.1 (mg/dL)   Total Protein 7.4  6.0 - 8.3 (g/dL)   Albumin 4.1  3.5 - 5.2 (g/dL)   AST 12  0 - 37 (U/L)   ALT 11  0 - 35 (U/L)   Alkaline Phosphatase 60  39 - 117 (U/L)   Total Bilirubin 0.3  0.3 - 1.2 (mg/dL)   GFR calc non Af Amer >60  >60 (mL/min)   GFR calc Af Amer >60  >60 (mL/min)  LIPASE, BLOOD      Component Value Range   Lipase 49  11 - 59 (U/L)     RADIOLOGY:  US Abdomen   Reviewed by me: IMPRESSION: Negative abdominal ultrasound. Original Report Authenticated By: Charline Bills, M.D  CT Abdomen Pelvis: Reviewed by me: IMPRESSION: No acute abnormality. It should be noted that this is the patient's fifth CT examination of the abdomen and pelvis this year and second within the past 2 weeks with no urinary tract calculi or acute abnormalities seen on any of the examinations. Original Report Authenticated By: Darrol Angel, M.D.   ED COURSE / COORDINATION OF CARE: [phone]  - Patient evaluated by ED physician, IV, Zofran, Reglan, CT Abdomen, US Abdomen, labs ordered  MDM: Patient without kidney stone on ct and no evidence of gallstones or cholecystitis on ultrasound. Ct Abdomen Pelvis Wo Contrast  01/17/2011  *RADIOLOGY REPORT*  Clinical Data: Right flank pain.  Hematuria.  History of nephrolithiasis.  CT ABDOMEN AND PELVIS WITHOUT CONTRAST  Technique:  Multidetector CT imaging of the abdomen and pelvis was performed following  the standard protocol without intravenous contrast.  Comparison: 01/03/2011.  Findings: Normal appearing kidneys, ureters and urinary bladder. No urinary tract calculi or hydronephrosis.  Stable tiny left pelvic phlebolith.  Mild lumbar and lower thoracic spine degenerative changes.  Incidentally noted 2.6 cm right ovarian cyst.  No evidence of appendicitis.  The remainder of the examination is unremarkable.  IMPRESSION: No acute abnormality.  It should be noted that this is the patient's fifth CT examination of the abdomen and pelvis this year and second within the past 2 weeks with no urinary tract calculi or acute abnormalities seen on any of the examinations.  Original Report Authenticated By: Darrol Angel, M.D.   Ct Abdomen Pelvis Wo Contrast  01/03/2011  *RADIOLOGY REPORT*  Clinical Data: Left flank pain, hematuria, history of kidney stone. Prior hysterectomy and appendectomy.  CT ABDOMEN AND PELVIS WITHOUT CONTRAST  Technique:  Multidetector CT imaging of the abdomen and pelvis was performed following the standard protocol without intravenous contrast.  Comparison: 11/16/2010  Findings: Subsegmental atelectasis at the lung bases.  Unenhanced liver, spleen, pancreas, and adrenal glands within normal limits.  Gallbladder is unremarkable.  No intrahepatic or extrahepatic ductal dilatation.  Kidneys unremarkable.  No renal calculi or hydronephrosis.  No evidence of bowel obstruction. No colonic wall thickening or inflammatory changes.  No evidence of abdominal aortic aneurysm.  No  abdominopelvic ascites.  No suspicious abdominopelvic lymphadenopathy.  Status post hysterectomy. Right ovary is unremarkable.  No left adnexal mass.  No ureteral or bladder calculi.  Bladder is within normal limits.  Visualized osseous structures are within normal limits.  IMPRESSION: No renal, ureteral, or bladder calculi.  No hydronephrosis.  No CT findings to account for the patient's abdominal complaints.  Original Report  Authenticated By: Charline Bills, M.D.   US Abdomen Complete  01/17/2011  *RADIOLOGY REPORT*  Clinical Data:  Right upper quadrant abdominal pain  COMPLETE ABDOMINAL ULTRASOUND  Comparison:  CT dated 01/03/2011  Findings:  Gallbladder:  No gallstones, gallbladder wall thickening, or pericholecystic fluid.  Negative sonographic Murphy's sign.  Common bile duct:  Measures 4 mm.  Liver:  No focal lesion identified.  Within normal limits in parenchymal echogenicity.  IVC:  Appears normal.  Pancreas:  Poorly visualized due to overlying bowel gas.  Spleen:  Measures 6.1 cm.  Right Kidney:  Measures 12.3 cm.  No mass or hydronephrosis.  Left Kidney:  Measures 11.4 cm.  No mass or hydronephrosis.  Abdominal aorta:  No aneurysm identified.  IMPRESSION: Negative abdominal ultrasound.  Original Report Authenticated By: Charline Bills, M.D.   History/physical exam/procedure(s) were performed by non-physician practitioner and as supervising physician I was immediately available for consultation/collaboration. I have reviewed all notes and am in agreement with care and plan. History/physical exam/procedure(s) were performed by non-physician practitioner and as supervising physician I was immediately available for consultation/collaboration. I have reviewed all notes and am in agreement with care and plan. History/physical exam/procedure(s) were performed by non-physician practitioner and as supervising physician I was immediately available for consultation/collaboration. I have reviewed all notes and am in agreement with care and plan.  IMPRESSION: Diagnoses that have been ruled out:  Diagnoses that are still under consideration:  Final diagnoses:    PLAN:  Discharge The patient is to return the emergency department if there is any worsening of symptoms. I have reviewed the discharge instructions with the patient.  CONDITION ON DISCHARGE: Stable  MEDICATIONS GIVEN IN THE E.D.  Medications  sodium  chloride 0.9 % bolus 1,000 mL (1000 mL Intravenous Given 01/17/11 1553)  ondansetron (ZOFRAN) injection 4 mg (4 mg Intravenous Given 01/17/11 1554)  metoCLOPramide (REGLAN) injection 10 mg (10 mg Intravenous Given 01/17/11 1554)  ciprofloxacin (CIPRO) tablet 500 mg (500 mg Oral Given 01/17/11 1825)    DISCHARGE MEDICATIONS: New Prescriptions   No medications on file   Procedures       Hilario Quarry, MD 01/18/11 0006

## 2011-01-17 NOTE — ED Notes (Signed)
Resting quietly in bed. Continues to complain of low back pain 7/10. Awaiting md recheck

## 2011-01-17 NOTE — ED Notes (Signed)
Resting quietly in bed. Resp even and unlabored. Awaiting md recheck

## 2011-02-04 NOTE — ED Provider Notes (Signed)
I personally performed the services described in this documentation, which was scribed in my presence. The recorded information has been reviewed and considered. No att. providers found   Donnetta Hutching, MD 02/04/11 1945

## 2011-02-13 LAB — URINALYSIS, ROUTINE W REFLEX MICROSCOPIC
Bilirubin Urine: NEGATIVE
Ketones, ur: NEGATIVE
Nitrite: NEGATIVE
Protein, ur: NEGATIVE
Urobilinogen, UA: 0.2

## 2011-02-13 LAB — CBC
MCHC: 34.7
MCV: 86.7
Platelets: 208
RDW: 13.4

## 2011-02-13 LAB — DIFFERENTIAL
Basophils Absolute: 0
Basophils Relative: 0
Eosinophils Relative: 0
Monocytes Absolute: 0.5

## 2011-02-13 LAB — COMPREHENSIVE METABOLIC PANEL
AST: 21
Albumin: 3.9
CO2: 25
Calcium: 8.6
Creatinine, Ser: 0.78
GFR calc Af Amer: >60
GFR calc non Af Amer: >60

## 2011-02-24 LAB — CBC
Hemoglobin: 10.7 — ABNORMAL LOW
Hemoglobin: 12.1
MCHC: 35.3
MCV: 87.6
Platelets: 211
RBC: 3.51 — ABNORMAL LOW
RDW: 13.8
WBC: 4.4

## 2011-02-24 LAB — DIFFERENTIAL
Eosinophils Absolute: 0
Lymphs Abs: 1.1
Monocytes Absolute: 0.5
Monocytes Relative: 10
Neutro Abs: 2.8
Neutrophils Relative %: 63

## 2011-02-24 LAB — COMPREHENSIVE METABOLIC PANEL
ALT: 16
Albumin: 3.9
Calcium: 9.3
GFR calc Af Amer: >60
Glucose, Bld: 89
Sodium: 139
Total Protein: 6.1

## 2011-02-24 LAB — TYPE AND SCREEN: ABO/RH(D): B POS

## 2011-02-28 LAB — DIFFERENTIAL
Basophils Absolute: 0
Basophils Absolute: 0
Basophils Relative: 0
Basophils Relative: 0
Eosinophils Absolute: 0 — ABNORMAL LOW
Eosinophils Absolute: 0 — ABNORMAL LOW
Eosinophils Relative: 0
Eosinophils Relative: 0
Lymphocytes Relative: 25
Monocytes Absolute: 0.4

## 2011-02-28 LAB — CBC
HCT: 28.2 — ABNORMAL LOW
HCT: 33.8 — ABNORMAL LOW
Hemoglobin: 10 — ABNORMAL LOW
MCHC: 35.4
MCV: 85.7
RBC: 3.94
RDW: 13.8
WBC: 4.9

## 2011-02-28 LAB — BASIC METABOLIC PANEL
CO2: 25
Glucose, Bld: 105 — ABNORMAL HIGH
Potassium: 3.6
Sodium: 139

## 2011-02-28 LAB — COMPREHENSIVE METABOLIC PANEL
AST: 15
CO2: 29
Chloride: 103
Creatinine, Ser: 0.74
GFR calc Af Amer: >60
GFR calc non Af Amer: >60
Total Bilirubin: 0.6

## 2011-03-03 LAB — CBC
HCT: 28.7 — ABNORMAL LOW
Hemoglobin: 13.6
MCHC: 35.5
MCHC: 35.5
MCHC: 36.2 — ABNORMAL HIGH
MCV: 85.1
MCV: 84.5
Platelets: 210
Platelets: 267
Platelets: 177
RBC: 3.77 — ABNORMAL LOW
RDW: 13.3
RDW: 13.7
RDW: 13.4

## 2011-03-03 LAB — DIFFERENTIAL
Basophils Absolute: 0
Basophils Absolute: 0
Basophils Absolute: 0
Basophils Absolute: 0
Basophils Relative: 0
Basophils Relative: 0
Basophils Relative: 0
Basophils Relative: 0
Eosinophils Absolute: 0 — ABNORMAL LOW
Eosinophils Relative: 0
Lymphocytes Relative: 44
Lymphocytes Relative: 31
Monocytes Absolute: 0.3
Monocytes Absolute: 0.3
Monocytes Relative: 10
Monocytes Relative: 9
Neutro Abs: 1.5 — ABNORMAL LOW
Neutro Abs: 2.6
Neutro Abs: 3.3
Neutrophils Relative %: 39 — ABNORMAL LOW
Neutrophils Relative %: 50
Neutrophils Relative %: 62

## 2011-03-03 LAB — BASIC METABOLIC PANEL
BUN: 5 — ABNORMAL LOW
BUN: 5 — ABNORMAL LOW
CO2: 25
CO2: 28
Calcium: 8.4
Chloride: 107
Chloride: 106
Creatinine, Ser: 0.81
Creatinine, Ser: 0.59
GFR calc Af Amer: >60
GFR calc Af Amer: >60
Sodium: 139

## 2011-03-03 LAB — TROPONIN I: Troponin I: 0.01

## 2011-03-03 LAB — URINALYSIS, ROUTINE W REFLEX MICROSCOPIC
Bilirubin Urine: NEGATIVE
Ketones, ur: NEGATIVE
Ketones, ur: NEGATIVE
Nitrite: NEGATIVE
Nitrite: NEGATIVE
Protein, ur: NEGATIVE
Specific Gravity, Urine: 1.022
Urobilinogen, UA: 0.2
Urobilinogen, UA: 0.2
pH: 6
pH: 5

## 2011-03-03 LAB — I-STAT 8, (EC8 V) (CONVERTED LAB)
BUN: 7
Glucose, Bld: 115 — ABNORMAL HIGH
HCT: 37
Hemoglobin: 12.6
Operator id: 217071
Potassium: 3.4 — ABNORMAL LOW
Sodium: 140
TCO2: 25

## 2011-03-03 LAB — HIV ANTIBODY (ROUTINE TESTING W REFLEX): HIV: NONREACTIVE

## 2011-03-03 LAB — HEPATITIS C ANTIBODY: HCV Ab: NEGATIVE

## 2011-03-03 LAB — CMV ABS, IGG+IGM (CYTOMEGALOVIRUS): CMV IgM: <0.9 Index (ref <0.90)

## 2011-03-03 LAB — TISSUE TRANSGLUTAMINASE, IGA: Tissue Transglutaminase Ab, IgA: 120 U/mL — ABNORMAL HIGH (ref <7)

## 2011-03-03 LAB — HEPATIC FUNCTION PANEL
ALT: 23
AST: 22
Total Protein: 7

## 2011-03-03 LAB — HEPATITIS B CORE ANTIBODY, TOTAL: Hep B Core Total Ab: NEGATIVE

## 2011-03-04 LAB — CORTISOL: Cortisol, Plasma: 10.7

## 2011-03-04 LAB — BASIC METABOLIC PANEL
BUN: 5 — ABNORMAL LOW
BUN: 3 — ABNORMAL LOW
CO2: 30
Calcium: 7.5 — ABNORMAL LOW
Calcium: 9.2
Calcium: 8.2 — ABNORMAL LOW
Calcium: 8.3 — ABNORMAL LOW
Calcium: 8.5
Creatinine, Ser: 0.51
Creatinine, Ser: 0.8
Creatinine, Ser: 0.71
GFR calc Af Amer: >60
GFR calc Af Amer: >60
GFR calc Af Amer: >60
GFR calc non Af Amer: >60
GFR calc non Af Amer: >60
GFR calc non Af Amer: >60
GFR calc non Af Amer: >60
Glucose, Bld: 106 — ABNORMAL HIGH
Potassium: 3.6
Sodium: 130 — ABNORMAL LOW
Sodium: 142

## 2011-03-04 LAB — BLOOD GAS, ARTERIAL
Bicarbonate: 21.4
Patient temperature: 37
TCO2: 19.6
pCO2 arterial: 34.4 — ABNORMAL LOW
pH, Arterial: 7.41 — ABNORMAL HIGH

## 2011-03-04 LAB — DIFFERENTIAL
Basophils Absolute: 0
Basophils Absolute: 0
Basophils Relative: 0
Basophils Relative: 0
Basophils Relative: 1
Eosinophils Absolute: 0 — ABNORMAL LOW
Eosinophils Relative: 0
Eosinophils Relative: 0
Lymphocytes Relative: 26
Lymphocytes Relative: 35
Lymphocytes Relative: 41
Lymphocytes Relative: 43
Lymphs Abs: 0.9
Lymphs Abs: 1.3
Monocytes Absolute: 0.3
Monocytes Absolute: 0.3
Monocytes Absolute: 0.3
Monocytes Relative: 10
Monocytes Relative: 11
Monocytes Relative: 8
Monocytes Relative: 8
Neutro Abs: 1.5 — ABNORMAL LOW
Neutro Abs: 2.3
Neutro Abs: 1.3 — ABNORMAL LOW
Neutro Abs: 2.1
Neutrophils Relative %: 48
Neutrophils Relative %: 64

## 2011-03-04 LAB — URINALYSIS, ROUTINE W REFLEX MICROSCOPIC
Bilirubin Urine: NEGATIVE
Bilirubin Urine: NEGATIVE
Glucose, UA: NEGATIVE
Hgb urine dipstick: NEGATIVE
Ketones, ur: NEGATIVE
Nitrite: NEGATIVE
Nitrite: NEGATIVE
Protein, ur: NEGATIVE
Specific Gravity, Urine: 1.02
Specific Gravity, Urine: 1.018
Urobilinogen, UA: 0.2
pH: 6
pH: 6.5
pH: 5.5

## 2011-03-04 LAB — CBC
HCT: 29.6 — ABNORMAL LOW
Hemoglobin: 12.2
Hemoglobin: 10.4 — ABNORMAL LOW
Hemoglobin: 10.5 — ABNORMAL LOW
Hemoglobin: 10.6 — ABNORMAL LOW
Hemoglobin: 12.7
MCHC: 35
MCHC: 35.3
MCHC: 35.1
MCHC: 35.9
MCHC: 36.3 — ABNORMAL HIGH
MCV: 85.5
MCV: 86.2
Platelets: 203
RBC: 3.41 — ABNORMAL LOW
RBC: 4.06
RBC: 3.43 — ABNORMAL LOW
RBC: 3.48 — ABNORMAL LOW
RBC: 4.14
RDW: 12.9
RDW: 12.9
RDW: 12.9
WBC: 3.6 — ABNORMAL LOW
WBC: 4.2
WBC: 4.3

## 2011-03-04 LAB — COMPREHENSIVE METABOLIC PANEL
ALT: 30
ALT: 29
AST: 29
Albumin: 4.5
Alkaline Phosphatase: 48
Alkaline Phosphatase: 45
BUN: 5 — ABNORMAL LOW
CO2: 27
CO2: 28
Calcium: 8.6
Calcium: 8.5
Chloride: 104
Creatinine, Ser: 0.85
GFR calc Af Amer: >60
GFR calc non Af Amer: >60
GFR calc non Af Amer: >60
Glucose, Bld: 96
Glucose, Bld: 109 — ABNORMAL HIGH
Potassium: 4.1
Sodium: 138
Sodium: 140
Total Bilirubin: 0.5
Total Protein: 6.5
Total Protein: 7.4

## 2011-03-04 LAB — GC/CHLAMYDIA PROBE AMP, GENITAL
Chlamydia, DNA Probe: NEGATIVE
GC Probe Amp, Genital: NEGATIVE

## 2011-03-04 LAB — URINE CULTURE
Colony Count: NO GROWTH
Special Requests: POSITIVE

## 2011-03-04 LAB — PORPHYRINS, FRACTIONATED URINE (TIMED COLLECTION)
Coproporphyrin, U: 4 (ref 0–22)
Creatinine, Urine mg/day-PORPF: 923 mg/d (ref 700–1600)
Creatinine, Urine-mg/dL-PORPF: 41 mg/dL
Heptacarboxyl Porphyrin, 24H Ur: 0 (ref 0–2)
Porphyrins Interpretation: NORMAL
Time-PORPF: 24 hr
Uroporphyrin, Urine: 1 (ref 0–4)
Volume, Urine-PORPF: 2250

## 2011-03-04 LAB — CK TOTAL AND CKMB (NOT AT ARMC)
CK, MB: 0.7
CK, MB: 0.9
Total CK: 70

## 2011-03-04 LAB — CARDIAC PANEL(CRET KIN+CKTOT+MB+TROPI)
CK, MB: 0.7
Relative Index: INVALID
Total CK: 94
Troponin I: 0.02

## 2011-03-04 LAB — HEPATIC FUNCTION PANEL
ALT: 27
AST: 22
Albumin: 4.1
Alkaline Phosphatase: 36 — ABNORMAL LOW
Total Bilirubin: 0.7

## 2011-03-04 LAB — AMYLASE: Amylase: 95

## 2011-03-04 LAB — RETICULOCYTES: Retic Count, Absolute: 36.6

## 2011-03-04 LAB — TECHNOLOGIST SMEAR REVIEW

## 2011-03-04 LAB — CULTURE, BLOOD (ROUTINE X 2)
Culture: NO GROWTH
Report Status: 11302008

## 2011-03-04 LAB — IRON AND TIBC
Iron: 48
Saturation Ratios: 15 — ABNORMAL LOW
TIBC: 325
UIBC: 277

## 2011-03-04 LAB — SEDIMENTATION RATE: Sed Rate: 5

## 2011-03-04 LAB — WET PREP, GENITAL: Trich, Wet Prep: NONE SEEN

## 2011-03-04 LAB — MAGNESIUM: Magnesium: 2

## 2011-03-04 LAB — RPR: RPR Ser Ql: NONREACTIVE

## 2011-03-04 LAB — TSH: TSH: 1.048

## 2011-06-04 ENCOUNTER — Encounter (HOSPITAL_COMMUNITY): Payer: Self-pay

## 2011-06-04 ENCOUNTER — Emergency Department (HOSPITAL_COMMUNITY)
Admission: EM | Admit: 2011-06-04 | Discharge: 2011-06-04 | Disposition: A | Discharge status: Home / Self Care | Payer: Medicaid Other | Attending: Emergency Medicine | Admitting: Emergency Medicine

## 2011-06-04 ENCOUNTER — Emergency Department (HOSPITAL_COMMUNITY): Payer: Medicaid Other

## 2011-06-04 DIAGNOSIS — Z9079 Acquired absence of other genital organ(s): Secondary | ICD-10-CM | POA: Insufficient documentation

## 2011-06-04 DIAGNOSIS — Z87442 Personal history of urinary calculi: Secondary | ICD-10-CM | POA: Insufficient documentation

## 2011-06-04 DIAGNOSIS — R319 Hematuria, unspecified: Secondary | ICD-10-CM | POA: Insufficient documentation

## 2011-06-04 DIAGNOSIS — F17200 Nicotine dependence, unspecified, uncomplicated: Secondary | ICD-10-CM | POA: Insufficient documentation

## 2011-06-04 DIAGNOSIS — K900 Celiac disease: Secondary | ICD-10-CM | POA: Insufficient documentation

## 2011-06-04 DIAGNOSIS — R109 Unspecified abdominal pain: Secondary | ICD-10-CM | POA: Insufficient documentation

## 2011-06-04 LAB — URINE CULTURE
Colony Count: NO GROWTH
Culture  Setup Time: 201301101358
Culture: NO GROWTH

## 2011-06-04 LAB — BASIC METABOLIC PANEL
CO2: 28 mEq/L (ref 19–32)
Calcium: 8.8 mg/dL (ref 8.4–10.5)
Chloride: 104 mEq/L (ref 96–112)
Glucose, Bld: 90 mg/dL (ref 70–99)
Potassium: 3.9 mEq/L (ref 3.5–5.1)
Sodium: 137 mEq/L (ref 135–145)

## 2011-06-04 LAB — DIFFERENTIAL
Basophils Absolute: 0 10*3/uL (ref 0.0–0.1)
Eosinophils Relative: 2 % (ref 0–5)
Lymphocytes Relative: 38 % (ref 12–46)
Lymphs Abs: 2.1 10*3/uL (ref 0.7–4.0)
Neutro Abs: 2.9 10*3/uL (ref 1.7–7.7)

## 2011-06-04 LAB — URINALYSIS, ROUTINE W REFLEX MICROSCOPIC
Bilirubin Urine: NEGATIVE
Glucose, UA: NEGATIVE mg/dL
Urobilinogen, UA: 0.2 mg/dL (ref 0.0–1.0)

## 2011-06-04 LAB — CBC
HCT: 35.5 % — ABNORMAL LOW (ref 36.0–46.0)
MCV: 86.6 fL (ref 78.0–100.0)
Platelets: 215 10*3/uL (ref 150–400)
RBC: 4.1 MIL/uL (ref 3.87–5.11)
RDW: 14 % (ref 11.5–15.5)
WBC: 5.5 10*3/uL (ref 4.0–10.5)

## 2011-06-04 LAB — URINE MICROSCOPIC-ADD ON

## 2011-06-04 MED ORDER — SODIUM CHLORIDE 0.9 % IV SOLN
Freq: Once | INTRAVENOUS | Status: AC
Start: 2011-06-04 — End: 2011-06-04
  Administered 2011-06-04: 20:00:00 via INTRAVENOUS

## 2011-06-04 MED ORDER — HYDROCODONE-ACETAMINOPHEN 5-325 MG PO TABS: 1.0000 | ORAL_TABLET | Freq: Four times a day (QID) | ORAL | Status: AC | PRN

## 2011-06-04 MED ORDER — ONDANSETRON HCL 4 MG/2ML IJ SOLN
4.0000 mg | Freq: Once | INTRAMUSCULAR | Status: AC
  Administered 2011-06-04: 4 mg via INTRAVENOUS
  Filled 2011-06-04: qty 2

## 2011-06-04 MED ORDER — HYDROMORPHONE HCL PF 1 MG/ML IJ SOLN
1.0000 mg | Freq: Once | INTRAMUSCULAR | Status: AC
  Administered 2011-06-04: 1 mg via INTRAVENOUS
  Filled 2011-06-04: qty 1

## 2011-06-04 MED ORDER — KETOROLAC TROMETHAMINE 30 MG/ML IJ SOLN
30.0000 mg | Freq: Once | INTRAMUSCULAR | Status: AC
  Administered 2011-06-04: 30 mg via INTRAVENOUS
  Filled 2011-06-04: qty 1

## 2011-06-04 NOTE — ED Notes (Signed)
Pt alert & oriented x4, stable gait. Pt given discharge instructions, paperwork & prescription(s), pt verbalized understanding. Pt left department w/ no further questions.  

## 2011-06-04 NOTE — ED Notes (Signed)
Pulled pt into triage to reassess.  Explained delay and apologized for wait time.

## 2011-06-04 NOTE — ED Provider Notes (Signed)
History    This chart was scribed for Samantha Lennert, MD, MD by Smitty Pluck. The patient was seen in room [facility] and the patient's care was started at 6:51PM.   CSN: [id number]  Arrival date & time 06/04/11  1422   First MD Initiated Contact with Patient 06/04/11 1848      Chief Complaint  Patient presents with  . Flank Pain    (Consider location/radiation/quality/duration/timing/severity/associated sxs/prior treatment) Patient is a 36 y.o. female presenting with flank pain. The history is provided by the patient.  Flank Pain   Samantha [name] is a 36 y.o. female who presents to the Emergency Department complaining of moderate right flank pain radiating around to lower abdomen onset 2 days ago. Pt reports having kidney stone 3 months ago. Pt reports having hematuria, nausea and vomiting. The symptoms have been constant since onset.   Past Medical History  Diagnosis Date  . Celiac disease   . Interstitial cystitis   . Kidney stones   . Kidney stones     Past Surgical History  Procedure Date  . Cesarean section   . Tonsillectomy   . Appendectomy   . Abdominal hysterectomy   . Hernia repair   . Adenoidectomy   . Kidney stone surgery     No family history on file.  History  Substance Use Topics  . Smoking status: Current Some Day Smoker -- 0.5 packs/day  . Smokeless tobacco: Not on file  . Alcohol Use: No    OB History    Grav Para Term Preterm Abortions TAB SAB Ect Mult Living                  Review of Systems  Genitourinary: Positive for flank pain.  All other systems reviewed and are negative.  10 Systems reviewed and are negative for acute change except as noted in the HPI.  Allergies  Gluten  Home Medications   Current Outpatient Rx  Name Route Sig Dispense Refill  . ALPRAZOLAM 0.5 MG PO TABS Oral Take 0.5 mg by mouth 4 (four) times daily as needed. For anxiety    . CITALOPRAM HYDROBROMIDE 20 MG PO TABS Oral Take 20 mg by mouth daily.      Marland Kitchen  ESTRADIOL 1 MG PO TABS Oral Take 1 mg by mouth daily.      Marland Kitchen HYDROCODONE-ACETAMINOPHEN 10-325 MG PO TABS Oral Take 1 tablet by mouth as needed. For pain    . IBUPROFEN 200 MG PO TABS Oral Take 200-600 mg by mouth daily as needed. For pain    . VERAPAMIL HCL 120 MG PO TBCR Oral Take 120 mg by mouth every morning.     Marland Kitchen HYDROCODONE-ACETAMINOPHEN 5-325 MG PO TABS Oral Take 1 tablet by mouth every 6 (six) hours as needed for pain. 20 tablet 0    BP 103/58  Pulse 88  Temp(Src) 98.2 F (36.8 C) (Oral)  Resp 18  Ht 5\' 1"  (1.549 m)  Wt 180 lb (81.647 kg)  BMI 34.01 kg/m2  SpO2 100%  Physical Exam  Nursing note and vitals reviewed. Constitutional: She is oriented to person, place, and time. She appears well-developed and well-nourished.  HENT:  Head: Normocephalic and atraumatic.  Eyes: Conjunctivae and EOM are normal. No scleral icterus.  Neck: Neck supple. No thyromegaly present.  Cardiovascular: Normal rate and regular rhythm.  Exam reveals no gallop and no friction rub.   No murmur heard. Pulmonary/Chest: No stridor. She has no wheezes. She has no  rales. She exhibits no tenderness.  Abdominal: She exhibits no distension. There is tenderness (right flank). There is no rebound.  Musculoskeletal: Normal range of motion. She exhibits no edema.  Lymphadenopathy:    She has no cervical adenopathy.  Neurological: She is alert and oriented to person, place, and time. Coordination normal.  Skin: No rash noted. No erythema.  Psychiatric: She has a normal mood and affect. Her behavior is normal.    ED Course  Procedures (including critical care time)  DIAGNOSTIC STUDIES: Oxygen Saturation is 100% on room air, normal by my interpretation.    COORDINATION OF CARE:  8:26PM: Recheck: EDP discussed lab results and treatment course with pt. Pt is ready for discharge.    Labs Reviewed  URINALYSIS, ROUTINE W REFLEX MICROSCOPIC - Abnormal; Notable for the following:    APPearance HAZY (*)     Specific Gravity, Urine >1.030 (*)    Hgb urine dipstick LARGE (*)    Protein, ur TRACE (*)    All other components within normal limits  URINE MICROSCOPIC-ADD ON - Abnormal; Notable for the following:    Squamous Epithelial / LPF MANY (*)    Bacteria, UA FEW (*)    All other components within normal limits  CBC - Abnormal; Notable for the following:    HCT 35.5 (*)    All other components within normal limits  DIFFERENTIAL  BASIC METABOLIC PANEL  URINE CULTURE   Ct Abdomen Pelvis Wo Contrast  06/04/2011  *RADIOLOGY REPORT*  Clinical Data: Knee pain and hematuria.  CT ABDOMEN AND PELVIS WITHOUT CONTRAST  Technique:  Multidetector CT imaging of the abdomen and pelvis was performed following the standard protocol without intravenous contrast.  Comparison: 01/17/2011  Findings: No focal abnormalities seen in the liver or spleen on this study performed without intravenous contrast material.  The stomach, duodenum, pancreas, gallbladder, adrenal glands, and kidneys have normal imaging features.  Specifically, there is no evidence for renal stones or secondary changes in either kidney. No stones are seen in either ureter or within the bladder.  No abdominal aortic aneurysm.  No lymphadenopathy or free fluid in the abdomen.  Imaging through the pelvis shows no free intraperitoneal fluid.  No pelvic sidewall lymphadenopathy.  Uterus is surgically absent. There is no adnexal mass. Terminal ileum is normal.  The appendix is not visualized, but there is no edema or inflammation in the region of the cecum.  Bone windows reveal no worrisome lytic or sclerotic osseous lesions.  IMPRESSION: No acute findings in the abdomen or pelvis.  Specifically, no evidence for urinary stones.  Original Report Authenticated By: ERIC A. MANSELL, M.D.     1. Hematuria       MDM        The chart was scribed for me under my direct supervision.  I personally performed the history, physical, and medical decision making  and all procedures in the evaluation of this patient.Samantha Lennert, MD 06/04/11 2029

## 2011-06-04 NOTE — ED Notes (Signed)
Pt reports for the past 2 days has had pain in left flank radiating around to lower abd with hematuria, nausea, and vomiting.  Reports history of kidney stones requiring surgery.

## 2011-06-04 NOTE — ED Notes (Signed)
Patient complaining of flank pain bilaterally. States that it radiates from left to right, worse on left side. States this has been going on for 2 days, and has gotten worse. States history of kidney stones.

## 2011-06-26 ENCOUNTER — Emergency Department (HOSPITAL_COMMUNITY)
Admission: EM | Admit: 2011-06-26 | Discharge: 2011-06-26 | Disposition: A | Discharge status: Home / Self Care | Payer: Medicaid Other | Attending: Emergency Medicine | Admitting: Emergency Medicine

## 2011-06-26 ENCOUNTER — Encounter (HOSPITAL_COMMUNITY): Payer: Self-pay

## 2011-06-26 DIAGNOSIS — R109 Unspecified abdominal pain: Secondary | ICD-10-CM | POA: Insufficient documentation

## 2011-06-26 DIAGNOSIS — K900 Celiac disease: Secondary | ICD-10-CM | POA: Insufficient documentation

## 2011-06-26 DIAGNOSIS — G8929 Other chronic pain: Secondary | ICD-10-CM | POA: Insufficient documentation

## 2011-06-26 DIAGNOSIS — M545 Low back pain, unspecified: Secondary | ICD-10-CM | POA: Insufficient documentation

## 2011-06-26 DIAGNOSIS — R319 Hematuria, unspecified: Secondary | ICD-10-CM | POA: Insufficient documentation

## 2011-06-26 DIAGNOSIS — M549 Dorsalgia, unspecified: Secondary | ICD-10-CM

## 2011-06-26 DIAGNOSIS — Z79899 Other long term (current) drug therapy: Secondary | ICD-10-CM | POA: Insufficient documentation

## 2011-06-26 DIAGNOSIS — M7918 Myalgia, other site: Secondary | ICD-10-CM

## 2011-06-26 LAB — URINALYSIS, MICROSCOPIC ONLY
Bilirubin Urine: NEGATIVE
Ketones, ur: NEGATIVE mg/dL
Nitrite: NEGATIVE
Protein, ur: 30 mg/dL — AB
Urobilinogen, UA: 0.2 mg/dL (ref 0.0–1.0)

## 2011-06-26 MED ORDER — ONDANSETRON 4 MG PO TBDP
4.0000 mg | ORAL_TABLET | Freq: Once | ORAL | Status: AC
  Administered 2011-06-26: 4 mg via ORAL
  Filled 2011-06-26: qty 1

## 2011-06-26 MED ORDER — KETOROLAC TROMETHAMINE 60 MG/2ML IM SOLN
60.0000 mg | Freq: Once | INTRAMUSCULAR | Status: AC
  Administered 2011-06-26: 60 mg via INTRAMUSCULAR
  Filled 2011-06-26: qty 2

## 2011-06-26 NOTE — ED Provider Notes (Signed)
History     CSN: [id number]  Arrival date & time 06/26/11  1826   First MD Initiated Contact with Patient 06/26/11 1915      Chief Complaint  Patient presents with  . Hematuria    (Consider location/radiation/quality/duration/timing/severity/associated sxs/prior treatment) HPI  Past Medical History  Diagnosis Date  . Celiac disease   . Interstitial cystitis   . Kidney stones   . Kidney stones     Past Surgical History  Procedure Date  . Cesarean section   . Tonsillectomy   . Appendectomy   . Abdominal hysterectomy   . Hernia repair   . Adenoidectomy   . Kidney stone surgery     No family history on file.  History  Substance Use Topics  . Smoking status: Current Some Day Smoker -- 0.5 packs/day  . Smokeless tobacco: Not on file  . Alcohol Use: No    OB History    Grav Para Term Preterm Abortions TAB SAB Ect Mult Living                  Review of Systems  Allergies  Gluten  Home Medications   Current Outpatient Rx  Name Route Sig Dispense Refill  . ALPRAZOLAM 0.5 MG PO TABS Oral Take 0.5 mg by mouth 4 (four) times daily as needed. For anxiety    . CITALOPRAM HYDROBROMIDE 20 MG PO TABS Oral Take 20 mg by mouth daily.      Marland Kitchen ESTRADIOL 1 MG PO TABS Oral Take 1 mg by mouth daily.      Marland Kitchen HYDROCODONE-ACETAMINOPHEN 10-325 MG PO TABS Oral Take 1 tablet by mouth as needed. For pain    . VERAPAMIL HCL 120 MG PO TBCR Oral Take 120 mg by mouth every morning.       BP 108/71  Pulse 103  Temp(Src) 97.6 F (36.4 C) (Oral)  Resp 16  Ht 5\' 1"  (1.549 m)  Wt 190 lb (86.183 kg)  BMI 35.90 kg/m2  SpO2 98%  Physical Exam  ED Course  Procedures (including critical care time)  Labs Reviewed  URINALYSIS, WITH MICROSCOPIC - Abnormal; Notable for the following:    Color, Urine ORANGE (*) BIOCHEMICALS MAY BE AFFECTED BY COLOR   APPearance CLOUDY (*)    Hgb urine dipstick LARGE (*)    Protein, ur 30 (*)    Bacteria, UA FEW (*)    Squamous Epithelial / LPF FEW  (*)    All other components within normal limits   No results found.   1. Muscular abdominal pain in right flank   2. Back pain   3. Chronic pain       MDM  Urine noted with hematuria, which is patient's norm - already has medications and encouraged to follow up with urologist in [place].        Izola Price [place], [place], [place] 06/26/11 2057

## 2011-06-26 NOTE — ED Notes (Signed)
Hematuria with lower abdominal pain began 2 days ago.  Pt. Has a hx of kidney stones with stent placement.

## 2011-06-26 NOTE — ED Provider Notes (Signed)
History     CSN: [id number]  Arrival date & time 06/26/11  1826   First MD Initiated Contact with Patient 06/26/11 1915      Chief Complaint  Patient presents with  . Hematuria   patient presents with continued right lower back pain. Also, states she is "pissing blood." Patient has a long-standing history of chronic flank pain, chronic back pain, as well as kidney stones in the past. However, from review of the chart it appears that she has had multiple multiple abdominal CT scans none of them showing any acute abnormality.  The last CAT scan was done in 06/04/2011, which showed no acute findings in the abdomen and pelvis, and no evidence of any urinary stones. Patient states she is getting a refill for her hydrocodone on Monday, and she is scheduled to see the urologist in [place] in approximately 7-10 days. She has had no vomiting or fever.  The pain. She states is worse in her right lower back and also around both flanks worse on the right. She's had no dizziness, no syncope, no shortness of breath, no chest pain.  (Consider location/radiation/quality/duration/timing/severity/associated sxs/prior treatment) HPI  Past Medical History  Diagnosis Date  . Celiac disease   . Interstitial cystitis   . Kidney stones   . Kidney stones     Past Surgical History  Procedure Date  . Cesarean section   . Tonsillectomy   . Appendectomy   . Abdominal hysterectomy   . Hernia repair   . Adenoidectomy   . Kidney stone surgery     No family history on file.  History  Substance Use Topics  . Smoking status: Current Some Day Smoker -- 0.5 packs/day  . Smokeless tobacco: Not on file  . Alcohol Use: No    OB History    Grav Para Term Preterm Abortions TAB SAB Ect Mult Living                  Review of Systems  All other systems reviewed and are negative.    Allergies  Gluten  Home Medications   Current Outpatient Rx  Name Route Sig Dispense Refill  . ALPRAZOLAM 0.5 MG PO  TABS Oral Take 0.5 mg by mouth 4 (four) times daily as needed. For anxiety    . CITALOPRAM HYDROBROMIDE 20 MG PO TABS Oral Take 20 mg by mouth daily.      Marland Kitchen ESTRADIOL 1 MG PO TABS Oral Take 1 mg by mouth daily.      Marland Kitchen HYDROCODONE-ACETAMINOPHEN 10-325 MG PO TABS Oral Take 1 tablet by mouth as needed. For pain    . VERAPAMIL HCL 120 MG PO TBCR Oral Take 120 mg by mouth every morning.       BP 108/71  Pulse 103  Temp(Src) 97.6 F (36.4 C) (Oral)  Resp 16  Ht 5\' 1"  (1.549 m)  Wt 190 lb (86.183 kg)  BMI 35.90 kg/m2  SpO2 98%  Physical Exam  Nursing note and vitals reviewed. Constitutional: She is oriented to person, place, and time. She appears well-developed and well-nourished.  HENT:  Head: Normocephalic and atraumatic.  Eyes: Conjunctivae and EOM are normal. Pupils are equal, round, and reactive to light.  Neck: Neck supple.  Cardiovascular: Normal rate and regular rhythm.  Exam reveals no gallop and no friction rub.   No murmur heard. Pulmonary/Chest: Breath sounds normal. She has no wheezes. She has no rales. She exhibits no tenderness.  Abdominal: Soft. Bowel sounds are normal. She  exhibits no distension. There is no tenderness. There is no rebound and no guarding.  Genitourinary:       No specific CVA tenderness  Musculoskeletal: Normal range of motion.       Diffuse tenderness in the right lower back to palpation in the musculature.  Neurological: She is alert and oriented to person, place, and time. No cranial nerve deficit. Coordination normal.  Skin: Skin is warm and dry. No rash noted.  Psychiatric: She has a normal mood and affect.    ED Course  Procedures (including critical care time)   Labs Reviewed  URINALYSIS, WITH MICROSCOPIC   No results found.   No diagnosis found. Patient is seen and examined, initial history and physical is completed. Evaluation initiated   MDM  Patient is seen and examined, initial history and physical is completed. Evaluation  initiated    Symptomatic treatment for low back pain and possible hematuria. Patient's chart was reviewed with her and did go through the multiple previous CAT scans, which were done, and normal. Patient was told she did not need any repeat CAT scans at this time, giving 60 mg of Toradol IM with oral dissolving Zofran. Waiting for urinalysis, and then can be discharged home. Also looked at the previous urine cultures, which were negative.    Clement Deneault A. Patrica Duel, MD 06/26/11 9562

## 2011-06-27 NOTE — ED Provider Notes (Signed)
Medical screening examination/treatment/procedure(s) were conducted as a shared visit with non-physician practitioner(s) and myself.  I personally evaluated the patient during the encounter   Addyson Traub A. Patrica Duel, MD 06/27/11 1454

## 2011-06-28 ENCOUNTER — Encounter (HOSPITAL_COMMUNITY): Payer: Self-pay | Admitting: *Deleted

## 2011-06-28 ENCOUNTER — Emergency Department (HOSPITAL_COMMUNITY)
Admission: EM | Admit: 2011-06-28 | Discharge: 2011-06-29 | Disposition: A | Discharge status: Home / Self Care | Payer: Medicaid Other | Attending: Emergency Medicine | Admitting: Emergency Medicine

## 2011-06-28 DIAGNOSIS — K900 Celiac disease: Secondary | ICD-10-CM | POA: Insufficient documentation

## 2011-06-28 DIAGNOSIS — Z9079 Acquired absence of other genital organ(s): Secondary | ICD-10-CM | POA: Insufficient documentation

## 2011-06-28 DIAGNOSIS — M545 Low back pain, unspecified: Secondary | ICD-10-CM | POA: Insufficient documentation

## 2011-06-28 DIAGNOSIS — R319 Hematuria, unspecified: Secondary | ICD-10-CM | POA: Insufficient documentation

## 2011-06-28 DIAGNOSIS — R4789 Other speech disturbances: Secondary | ICD-10-CM | POA: Insufficient documentation

## 2011-06-28 DIAGNOSIS — R109 Unspecified abdominal pain: Secondary | ICD-10-CM | POA: Insufficient documentation

## 2011-06-28 DIAGNOSIS — Z87442 Personal history of urinary calculi: Secondary | ICD-10-CM | POA: Insufficient documentation

## 2011-06-28 DIAGNOSIS — N3010 Interstitial cystitis (chronic) without hematuria: Secondary | ICD-10-CM | POA: Insufficient documentation

## 2011-06-28 DIAGNOSIS — F17200 Nicotine dependence, unspecified, uncomplicated: Secondary | ICD-10-CM | POA: Insufficient documentation

## 2011-06-28 NOTE — ED Notes (Signed)
Pt reports bilateral flank pain worsening yesterday

## 2011-06-29 LAB — URINALYSIS, ROUTINE W REFLEX MICROSCOPIC
Glucose, UA: NEGATIVE mg/dL
Leukocytes, UA: NEGATIVE
Protein, ur: NEGATIVE mg/dL
Specific Gravity, Urine: >1.03 — ABNORMAL HIGH (ref 1.005–1.030)

## 2011-06-29 LAB — URINE MICROSCOPIC-ADD ON

## 2011-06-29 NOTE — ED Notes (Signed)
Pt resting with eyes closed. Respirations regular, even and unlabored.

## 2011-06-29 NOTE — ED Provider Notes (Addendum)
History     CSN: [id number]  Arrival date & time 06/28/11  2343   First MD Initiated Contact with Patient 06/28/11 2353      Chief Complaint  Patient presents with  . Hematuria    (Consider location/radiation/quality/duration/timing/severity/associated sxs/prior treatment) HPI Patient states she cramping, aching in lower abdomen and mid low back pain for two days.  Patient states she has had kidney stones in past and states it felt like pain she is having now.  Increased frequency of urination and hematuria.  Urologist is Dr. Jerre Simon- she did not contact him.  Nausea, no vomiting.  Past Medical History  Diagnosis Date  . Celiac disease   . Interstitial cystitis   . Kidney stones   . Kidney stones     Past Surgical History  Procedure Date  . Cesarean section   . Tonsillectomy   . Appendectomy   . Abdominal hysterectomy   . Hernia repair   . Adenoidectomy   . Kidney stone surgery     No family history on file.  History  Substance Use Topics  . Smoking status: Current Some Day Smoker -- 0.5 packs/day  . Smokeless tobacco: Not on file  . Alcohol Use: No    OB History    Grav Para Term Preterm Abortions TAB SAB Ect Mult Living                  Review of Systems  Unable to perform ROS   Allergies  Gluten  Home Medications   Current Outpatient Rx  Name Route Sig Dispense Refill  . ALPRAZOLAM 0.5 MG PO TABS Oral Take 0.5 mg by mouth 4 (four) times daily as needed. For anxiety    . CITALOPRAM HYDROBROMIDE 20 MG PO TABS Oral Take 20 mg by mouth daily.      Marland Kitchen ESTRADIOL 1 MG PO TABS Oral Take 1 mg by mouth daily.      Marland Kitchen HYDROCODONE-ACETAMINOPHEN 10-325 MG PO TABS Oral Take 1 tablet by mouth as needed. For pain    . VERAPAMIL HCL 120 MG PO TBCR Oral Take 120 mg by mouth every morning.       BP 116/66  Pulse 136  Temp(Src) 99.9 F (37.7 C) (Oral)  Resp 16  Ht 5\' 2"  (1.575 m)  Wt 190 lb (86.183 kg)  BMI 34.75 kg/m2  SpO2 97%  Physical Exam  Vitals  reviewed. Constitutional: She is oriented to person, place, and time. She appears well-developed and well-nourished.  HENT:  Head: Normocephalic and atraumatic.  Eyes: Conjunctivae are normal. Pupils are equal, round, and reactive to light.  Neck: Neck supple.  Cardiovascular: Normal rate, regular rhythm and normal heart sounds.   Pulmonary/Chest: Effort normal and breath sounds normal.  Abdominal: Soft. Bowel sounds are normal.  Musculoskeletal: Normal range of motion.  Neurological: She is alert and oriented to person, place, and time.       Some word slurring  Skin: Skin is warm and dry.    ED Course  Procedures (including critical care time)   Labs Reviewed  URINALYSIS, ROUTINE W REFLEX MICROSCOPIC   No results found.   No diagnosis found.   Results for orders placed during the hospital encounter of 06/28/11  URINALYSIS, ROUTINE W REFLEX MICROSCOPIC      Component Value Range   Color, Urine YELLOW  YELLOW    APPearance CLEAR  CLEAR    Specific Gravity, Urine >1.030 (*) 1.005 - 1.030    pH 5.0  5.0 - 8.0    Glucose, UA NEGATIVE  NEGATIVE (mg/dL)   Hgb urine dipstick LARGE (*) NEGATIVE    Bilirubin Urine NEGATIVE  NEGATIVE    Ketones, ur TRACE (*) NEGATIVE (mg/dL)   Protein, ur NEGATIVE  NEGATIVE (mg/dL)   Urobilinogen, UA 0.2  0.0 - 1.0 (mg/dL)   Nitrite NEGATIVE  NEGATIVE    Leukocytes, UA NEGATIVE  NEGATIVE   URINE MICROSCOPIC-ADD ON      Component Value Range   Squamous Epithelial / LPF FEW (*) RARE    WBC, UA 0-2  <3 (WBC/hpf)   RBC / HPF 21-50  <3 (RBC/hpf)   Bacteria, UA FEW (*) RARE     MDM  Patient with recent visit for same noted 1/31.  Patient did not inform me of this visit although asked specifically if she had had treatment or been seen since having symptoms.  Patient will have urine culutured and instructed to follow up with Dr. Jerre Simon Monday. REviewed all ct scans and no stone seen on all cts reviewed back several years.        Hilario Quarry, MD 06/29/11 2956  Hilario Quarry, MD 06/29/11 0111

## 2011-06-29 NOTE — ED Notes (Signed)
Pt reporting bilateral flank pain, worse on right. Reports she does have a history of kidney stones and stent placement.  Presently reporting that she has also noticed blood in her urine. Pt ambulated to bathroom without assistance, to provide urine sample.

## 2011-07-21 ENCOUNTER — Encounter (HOSPITAL_COMMUNITY): Payer: Self-pay

## 2011-07-21 ENCOUNTER — Emergency Department (HOSPITAL_COMMUNITY)
Admission: EM | Admit: 2011-07-21 | Discharge: 2011-07-21 | Discharge status: Against Medical Advice | Payer: Medicaid Other

## 2011-07-21 NOTE — ED Notes (Signed)
Called by staff and registration multiple times without response, ? Pt left AMA

## 2011-07-21 NOTE — ED Notes (Signed)
Complain of low back past picking up twins in carseats

## 2011-07-21 NOTE — ED Notes (Signed)
Pt left without informing staff of leaving, multiple calls without response

## 2011-08-28 ENCOUNTER — Encounter (HOSPITAL_COMMUNITY): Payer: Self-pay | Admitting: Emergency Medicine

## 2011-08-28 ENCOUNTER — Emergency Department (HOSPITAL_COMMUNITY): Payer: Medicaid Other

## 2011-08-28 ENCOUNTER — Emergency Department (HOSPITAL_COMMUNITY)
Admission: EM | Admit: 2011-08-28 | Discharge: 2011-08-28 | Disposition: A | Discharge status: Home / Self Care | Payer: Medicaid Other | Attending: Emergency Medicine | Admitting: Emergency Medicine

## 2011-08-28 DIAGNOSIS — K900 Celiac disease: Secondary | ICD-10-CM | POA: Insufficient documentation

## 2011-08-28 DIAGNOSIS — Z79899 Other long term (current) drug therapy: Secondary | ICD-10-CM | POA: Insufficient documentation

## 2011-08-28 DIAGNOSIS — Z87442 Personal history of urinary calculi: Secondary | ICD-10-CM | POA: Insufficient documentation

## 2011-08-28 DIAGNOSIS — R109 Unspecified abdominal pain: Secondary | ICD-10-CM | POA: Insufficient documentation

## 2011-08-28 DIAGNOSIS — T07XXXA Unspecified multiple injuries, initial encounter: Secondary | ICD-10-CM

## 2011-08-28 DIAGNOSIS — S060XAA Concussion with loss of consciousness status unknown, initial encounter: Secondary | ICD-10-CM | POA: Insufficient documentation

## 2011-08-28 DIAGNOSIS — M25519 Pain in unspecified shoulder: Secondary | ICD-10-CM | POA: Insufficient documentation

## 2011-08-28 DIAGNOSIS — R51 Headache: Secondary | ICD-10-CM | POA: Insufficient documentation

## 2011-08-28 DIAGNOSIS — S060X1A Concussion with loss of consciousness of 30 minutes or less, initial encounter: Secondary | ICD-10-CM | POA: Insufficient documentation

## 2011-08-28 DIAGNOSIS — F17200 Nicotine dependence, unspecified, uncomplicated: Secondary | ICD-10-CM | POA: Insufficient documentation

## 2011-08-28 DIAGNOSIS — Y9210 Unspecified residential institution as the place of occurrence of the external cause: Secondary | ICD-10-CM | POA: Insufficient documentation

## 2011-08-28 DIAGNOSIS — M542 Cervicalgia: Secondary | ICD-10-CM | POA: Insufficient documentation

## 2011-08-28 DIAGNOSIS — S0003XA Contusion of scalp, initial encounter: Secondary | ICD-10-CM | POA: Insufficient documentation

## 2011-08-28 DIAGNOSIS — S060X9A Concussion with loss of consciousness of unspecified duration, initial encounter: Secondary | ICD-10-CM | POA: Insufficient documentation

## 2011-08-28 HISTORY — DX: Cardiac arrhythmia, unspecified: I49.9

## 2011-08-28 MED ORDER — KETOROLAC TROMETHAMINE 60 MG/2ML IM SOLN
60.0000 mg | Freq: Once | INTRAMUSCULAR | Status: AC
  Administered 2011-08-28: 60 mg via INTRAMUSCULAR
  Filled 2011-08-28: qty 2

## 2011-08-28 MED ORDER — IBUPROFEN 600 MG PO TABS: 600.0000 mg | ORAL_TABLET | Freq: Four times a day (QID) | ORAL | Status: AC | PRN

## 2011-08-28 MED ORDER — OXYCODONE-ACETAMINOPHEN 5-325 MG PO TABS
1.0000 | ORAL_TABLET | Freq: Once | ORAL | Status: AC
  Administered 2011-08-28: 1 via ORAL
  Filled 2011-08-28: qty 1

## 2011-08-28 MED ORDER — OXYCODONE-ACETAMINOPHEN 5-325 MG PO TABS: 1.0000 | ORAL_TABLET | ORAL | Status: DC | PRN
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
## 2011-08-28 NOTE — ED Notes (Signed)
Pt being transported to radiology
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
## 2011-08-28 NOTE — ED Notes (Signed)
Patient states she is not comfortable and requested to "lay the head of my bed down a little." Adjusted head of bed and patient states it is better. Patient is sleeping upon entering room but is arousable on voice command. States "I have not slept in about 4 days."  Patient requested lights off in room. Lights turned off and patient continues to sleep at this time.
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
## 2011-08-28 NOTE — Discharge Instructions (Signed)
Concussion and Brain Injury A blow or jolt to the head can disrupt the normal function of the brain. This type of brain injury is often called a "concussion" or a "closed head injury." Concussions are usually not life-threatening. Even so, the effects of a concussion can be serious.  CAUSES  A concussion is caused by a blunt blow to the head. The blow might be direct or indirect as described below.  Direct blow (running into another player during a soccer game, being hit in a fight, or hitting your head on a hard surface).   Indirect blow (when your head moves rapidly and violently back and forth like in a car crash).  SYMPTOMS  The brain is very complex. Every head injury is different. Some symptoms may appear right away. Other symptoms may not show up for days or weeks after the concussion. The signs of concussion can be hard to notice. Early on, problems may be missed by patients, family members, and caregivers. You may look fine even though you are acting or feeling differently.  These symptoms are usually temporary, but may last for days, weeks, or even longer. Symptoms include:  Mild headaches that will not go away.   Having more trouble than usual with:   Remembering things.   Paying attention or concentrating.   Organizing daily tasks.   Making decisions and solving problems.   Slowness in thinking, acting, speaking, or reading.   Getting lost or easily confused.   Feeling tired all the time or lacking energy (fatigue).   Feeling drowsy.   Sleep disturbances.   Sleeping more than usual.   Sleeping less than usual.   Trouble falling asleep.   Trouble sleeping (insomnia).   Loss of balance or feeling lightheaded or dizzy.   Nausea or vomiting.   Numbness or tingling.   Increased sensitivity to:   Sounds.   Lights.   Distractions.  Other symptoms might include:  Vision problems or eyes that tire easily.   Diminished sense of taste or smell.   Ringing  in the ears.   Mood changes such as feeling sad, anxious, or listless.   Becoming easily irritated or angry for little or no reason.   Lack of motivation.  DIAGNOSIS  Your caregiver can usually diagnose a concussion or mild brain injury based on your description of your injury and your symptoms.  Your evaluation might include:  A brain scan to look for signs of injury to the brain. Even if the test shows no injury, you may still have a concussion.   Blood tests to be sure other problems are not present.  TREATMENT   People with a concussion need to be examined and evaluated. Most people with concussions are treated in an emergency department, urgent care, or clinic. Some people must stay in the hospital overnight for further treatment.   Your caregiver will send you home with important instructions to follow. Be sure to carefully follow them.   Tell your caregiver if you are already taking any medicines (prescription, over-the-counter, or natural remedies), or if you are drinking alcohol or taking illegal drugs. Also, talk with your caregiver if you are taking blood thinners (anticoagulants) or aspirin. These drugs may increase your chances of complications. All of this is important information that may affect treatment.   Only take over-the-counter or prescription medicines for pain, discomfort, or fever as directed by your caregiver.  PROGNOSIS  How fast people recover from brain injury varies from person to person.   Although most people have a good recovery, how quickly they improve depends on many factors. These factors include how severe their concussion was, what part of the brain was injured, their age, and how healthy they were before the concussion.  Because all head injuries are different, so is recovery. Most people with mild injuries recover fully. Recovery can take time. In general, recovery is slower in older persons. Also, persons who have had a concussion in the past or have  other medical problems may find that it takes longer to recover from their current injury. Anxiety and depression may also make it harder to adjust to the symptoms of brain injury. HOME CARE INSTRUCTIONS  Return to your normal activities slowly, not all at once. You must give your body and brain enough time for recovery.  Get plenty of sleep at night, and rest during the day. Rest helps the brain to heal.   Avoid staying up late at night.   Keep the same bedtime hours on weekends and weekdays.   Take daytime naps or rest breaks when you feel tired.   Limit activities that require a lot of thought or concentration (brain or cognitive rest). This includes:   Homework or job-related work.   Watching TV.   Computer work.   Avoid activities that could lead to a second brain injury, such as contact or recreational sports, until your caregiver says it is okay. Even after your brain injury has healed, you should protect yourself from having another concussion.   Ask your caregiver when you can return to your normal activities such as driving, bicycling, or operating heavy equipment. Your ability to react may be slower after a brain injury.   Talk with your caregiver about when you can return to work or school.   Inform your teachers, school nurse, school counselor, coach, Product/process development scientist, or work Freight forwarder about your injury, symptoms, and restrictions. They should be instructed to report:   Increased problems with attention or concentration.   Increased problems remembering or learning new information.   Increased time needed to complete tasks or assignments.   Increased irritability or decreased ability to cope with stress.   Increased symptoms.   Take only those medicines that your caregiver has approved.   Do not drink alcohol until your caregiver says you are well enough to do so. Alcohol and certain other drugs may slow your recovery and can put you at risk of further injury.    If it is harder than usual to remember things, write them down.   If you are easily distracted, try to do one thing at a time. For example, do not try to watch TV while fixing dinner.   Talk with family members or close friends when making important decisions.   Keep all follow-up appointments. Repeated evaluation of your symptoms is recommended for your recovery.  PREVENTION  Protect your head from future injury. It is very important to avoid another head or brain injury before you have recovered. In rare cases, another injury has lead to permanent brain damage, brain swelling, or death. Avoid injuries by using:  Seatbelts when riding in a car.   Alcohol only in moderation.   A helmet when biking, skiing, skateboarding, skating, or doing similar activities.   Safety measures in your home.   Remove clutter and tripping hazards from floors and stairways.   Use grab bars in bathrooms and handrails by stairs.   Place non-slip mats on floors and in bathtubs.  Improve lighting in dim areas.  SEEK MEDICAL CARE IF:  A head injury can cause lingering symptoms. You should seek medical care if you have any of the following symptoms for more than 3 weeks after your injury or are planning to return to sports:  Chronic headaches.   Dizziness or balance problems.   Nausea.   Vision problems.   Increased sensitivity to noise or light.   Depression or mood swings.   Anxiety or irritability.   Memory problems.   Difficulty concentrating or paying attention.   Sleep problems.   Feeling tired all the time.  SEEK IMMEDIATE MEDICAL CARE IF:  You have had a blow or jolt to the head and you (or your family or friends) notice:  Severe or worsening headaches.   Weakness (even if only in one hand or one leg or one part of the face), numbness, or decreased coordination.   Repeated vomiting.   Increased sleepiness or passing out.   One black center of the eye (pupil) is larger  than the other.   Convulsions (seizures).   Slurred speech.   Increasing confusion, restlessness, agitation, or irritability.   Lack of ability to recognize people or places.   Neck pain.   Difficulty being awakened.   Unusual behavior changes.   Loss of consciousness.  Older adults with a brain injury may have a higher risk of serious complications such as a blood clot on the brain. Headaches that get worse or an increase in confusion are signs of this complication. If these signs occur, see a caregiver right away. MAKE SURE YOU:   Understand these instructions.   Will watch your condition.   Will get help right away if you are not doing well or get worse.  FOR MORE INFORMATION  Several groups help people with brain injury and their families. They provide information and put people in touch with local resources. These include support groups, rehabilitation services, and a variety of health care professionals. Among these groups, the Brain Injury Association (BIA, www.biausa.org) has a Secretary/administrator that gathers scientific and educational information and works on a national level to help people with brain injury.  Document Released: 08/02/2003 Document Revised: 05/01/2011 Document Reviewed: 12/29/2007 [facility] Patient Information 2012 [place], [place].   You may use the medicines prescribed if needed for pain relief.  Use caution with oxycodone as this medicine will make you drowsy, do not drive within 4 hours of taking.  Get a good nights rest tonight.  Return here if you have any new or worsening symptoms as mentioned above.

## 2011-08-30 ENCOUNTER — Emergency Department (HOSPITAL_COMMUNITY)
Admission: EM | Admit: 2011-08-30 | Discharge: 2011-08-30 | Disposition: A | Discharge status: Home / Self Care | Payer: Medicaid Other | Attending: Emergency Medicine | Admitting: Emergency Medicine

## 2011-08-30 ENCOUNTER — Encounter (HOSPITAL_COMMUNITY): Payer: Self-pay | Admitting: Physical Medicine and Rehabilitation

## 2011-08-30 ENCOUNTER — Emergency Department (HOSPITAL_COMMUNITY): Payer: Medicaid Other

## 2011-08-30 DIAGNOSIS — S20219A Contusion of unspecified front wall of thorax, initial encounter: Secondary | ICD-10-CM

## 2011-08-30 DIAGNOSIS — R42 Dizziness and giddiness: Secondary | ICD-10-CM | POA: Insufficient documentation

## 2011-08-30 DIAGNOSIS — K900 Celiac disease: Secondary | ICD-10-CM | POA: Insufficient documentation

## 2011-08-30 DIAGNOSIS — Z79899 Other long term (current) drug therapy: Secondary | ICD-10-CM | POA: Insufficient documentation

## 2011-08-30 DIAGNOSIS — R110 Nausea: Secondary | ICD-10-CM | POA: Insufficient documentation

## 2011-08-30 DIAGNOSIS — S060X0A Concussion without loss of consciousness, initial encounter: Secondary | ICD-10-CM | POA: Insufficient documentation

## 2011-08-30 DIAGNOSIS — R51 Headache: Secondary | ICD-10-CM | POA: Insufficient documentation

## 2011-08-30 DIAGNOSIS — R079 Chest pain, unspecified: Secondary | ICD-10-CM | POA: Insufficient documentation

## 2011-08-30 MED ORDER — ONDANSETRON 4 MG PO TBDP
8.0000 mg | ORAL_TABLET | Freq: Once | ORAL | Status: AC
  Administered 2011-08-30: 8 mg via ORAL
  Filled 2011-08-30: qty 2

## 2011-08-30 MED ORDER — HYDROCODONE-ACETAMINOPHEN 5-325 MG PO TABS: 1.0000 | ORAL_TABLET | ORAL | Status: AC | PRN

## 2011-08-30 MED ORDER — HYDROCODONE-ACETAMINOPHEN 5-325 MG PO TABS
1.0000 | ORAL_TABLET | Freq: Once | ORAL | Status: AC
  Administered 2011-08-30: 1 via ORAL
  Filled 2011-08-30: qty 1
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
## 2011-08-30 NOTE — ED Notes (Signed)
Patient transported to CT 
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
## 2011-08-30 NOTE — ED Notes (Signed)
Rx given x1 D/c instructions reviewed w/ pt - pt denies any further questions or concerns at present.   
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
## 2011-08-31 NOTE — ED Provider Notes (Signed)
Medical screening examination/treatment/procedure(s) were performed by non-physician practitioner and as supervising physician I was immediately available for consultation/collaboration.  Doug Sou, MD 08/31/11 [phone]

## 2011-08-31 NOTE — ED Provider Notes (Signed)
Evaluation and management procedures were performed by the PA/NP/Resident Physician under my supervision/collaboration.   Shamari Lofquist D Miakoda Mcmillion, MD 08/31/11 1607 

## 2011-09-01 ENCOUNTER — Encounter (HOSPITAL_COMMUNITY): Payer: Self-pay | Admitting: *Deleted

## 2011-09-01 ENCOUNTER — Emergency Department (HOSPITAL_COMMUNITY)
Admission: EM | Admit: 2011-09-01 | Discharge: 2011-09-01 | Disposition: A | Discharge status: Home / Self Care | Payer: Medicaid Other | Attending: Emergency Medicine | Admitting: Emergency Medicine

## 2011-09-01 DIAGNOSIS — R0602 Shortness of breath: Secondary | ICD-10-CM | POA: Insufficient documentation

## 2011-09-01 DIAGNOSIS — R42 Dizziness and giddiness: Secondary | ICD-10-CM | POA: Insufficient documentation
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
## 2011-09-28 ENCOUNTER — Emergency Department (HOSPITAL_COMMUNITY)
Admission: EM | Admit: 2011-09-28 | Discharge: 2011-09-28 | Disposition: A | Discharge status: Home / Self Care | Payer: Self-pay | Attending: Emergency Medicine | Admitting: Emergency Medicine

## 2011-09-28 ENCOUNTER — Encounter (HOSPITAL_COMMUNITY): Payer: Self-pay

## 2011-09-28 ENCOUNTER — Emergency Department (HOSPITAL_COMMUNITY): Payer: Self-pay

## 2011-09-28 DIAGNOSIS — R109 Unspecified abdominal pain: Secondary | ICD-10-CM | POA: Insufficient documentation

## 2011-09-28 DIAGNOSIS — R300 Dysuria: Secondary | ICD-10-CM | POA: Insufficient documentation

## 2011-09-28 DIAGNOSIS — M549 Dorsalgia, unspecified: Secondary | ICD-10-CM | POA: Insufficient documentation

## 2011-09-28 DIAGNOSIS — Z79899 Other long term (current) drug therapy: Secondary | ICD-10-CM | POA: Insufficient documentation

## 2011-09-28 DIAGNOSIS — R1110 Vomiting, unspecified: Secondary | ICD-10-CM | POA: Insufficient documentation

## 2011-09-28 DIAGNOSIS — K900 Celiac disease: Secondary | ICD-10-CM | POA: Insufficient documentation

## 2011-09-28 DIAGNOSIS — R319 Hematuria, unspecified: Secondary | ICD-10-CM | POA: Insufficient documentation

## 2011-09-28 DIAGNOSIS — R55 Syncope and collapse: Secondary | ICD-10-CM | POA: Insufficient documentation

## 2011-09-28 HISTORY — DX: Reserved for concepts with insufficient information to code with codable children: IMO0002

## 2011-09-28 LAB — DIFFERENTIAL
Eosinophils Relative: 1 % (ref 0–5)
Lymphocytes Relative: 33 % (ref 12–46)
Lymphs Abs: 1.8 10*3/uL (ref 0.7–4.0)
Monocytes Absolute: 0.3 10*3/uL (ref 0.1–1.0)

## 2011-09-28 LAB — BASIC METABOLIC PANEL
CO2: 25 mEq/L (ref 19–32)
Calcium: 9 mg/dL (ref 8.4–10.5)
Chloride: 106 mEq/L (ref 96–112)
Glucose, Bld: 107 mg/dL — ABNORMAL HIGH (ref 70–99)
Sodium: 140 mEq/L (ref 135–145)

## 2011-09-28 LAB — CBC
HCT: 37.1 % (ref 36.0–46.0)
MCV: 83.7 fL (ref 78.0–100.0)
Platelets: 193 10*3/uL (ref 150–400)
RBC: 4.43 MIL/uL (ref 3.87–5.11)
WBC: 5.4 10*3/uL (ref 4.0–10.5)

## 2011-09-28 LAB — URINALYSIS, ROUTINE W REFLEX MICROSCOPIC
Glucose, UA: NEGATIVE mg/dL
Ketones, ur: NEGATIVE mg/dL
Protein, ur: NEGATIVE mg/dL

## 2011-09-28 MED ORDER — ONDANSETRON 4 MG PO TBDP
4.0000 mg | ORAL_TABLET | Freq: Once | ORAL | Status: AC
  Administered 2011-09-28: 4 mg via ORAL
  Filled 2011-09-28: qty 1

## 2011-09-28 MED ORDER — PROMETHAZINE HCL 25 MG PO TABS
25.0000 mg | ORAL_TABLET | Freq: Four times a day (QID) | ORAL | Status: DC | PRN
Start: 2011-09-28 — End: 2012-07-08

## 2011-09-28 MED ORDER — HYDROMORPHONE HCL PF 1 MG/ML IJ SOLN
1.0000 mg | Freq: Once | INTRAMUSCULAR | Status: AC
  Administered 2011-09-28: 1 mg via INTRAVENOUS
  Filled 2011-09-28: qty 1

## 2011-09-28 MED ORDER — SODIUM CHLORIDE 0.9 % IV SOLN
Freq: Once | INTRAVENOUS | Status: AC
  Administered 2011-09-28: 14:00:00 via INTRAVENOUS

## 2011-09-28 MED ORDER — HYDROCODONE-ACETAMINOPHEN 5-325 MG PO TABS: 1.0000 | ORAL_TABLET | Freq: Four times a day (QID) | ORAL | Status: AC | PRN

## 2011-09-28 NOTE — ED Provider Notes (Cosign Needed)
History   This chart was scribed for Samantha Lennert, MD by Toya Smothers. The patient was seen in room [facility]17/[facility]17. Patient's care was started at 1043.  CSN: [id number]  Arrival date & time 09/28/11  1043   First MD Initiated Contact with Patient 09/28/11 1325    Chief Complaint  Patient presents with  . Emesis    Patient is a 36 y.o. female presenting with vomiting. The history is provided by the patient (pt complains of left flank pain). No language interpreter was used.  Emesis  This is a new problem. The current episode started 6 to 12 hours ago. The problem occurs 2 to 4 times per day. The problem has not changed since onset.The emesis has an appearance of stomach contents. There has been no fever. Associated symptoms include abdominal pain. Pertinent negatives include no cough, no diarrhea, no fever and no headaches.    Samantha [name] is a 36 y.o. female who presents to the Emergency Department complaining of gradual onset severe abdominal pain onset 2 days ago with associated symptoms of emesis, hematuria, and pain in back. Pt is concerned after being told by friends that she passed out after feeling severe pain. Pt list a history of kidney stones, arrythmia, degenerative disc disease, and the removal of a stent one month ago. Pt is a current halp pack a day smoker but denies the use of alcohol.  Pt lists PCP as Dr. Bradly Bienenstock  Past Medical History  Diagnosis Date  . Celiac disease   . Interstitial cystitis   . Kidney stones   . Kidney stones   . Arrhythmia   . DDD (degenerative disc disease)   . Kidney stone    Past Surgical History  Procedure Date  . Cesarean section   . Tonsillectomy   . Appendectomy   . Abdominal hysterectomy   . Hernia repair   . Adenoidectomy   . Kidney stone surgery    No family history on file.  History  Substance Use Topics  . Smoking status: Current Some Day Smoker -- 0.5 packs/day    Types: Cigarettes  . Smokeless tobacco: Not on file  .  Alcohol Use: No   Review of Systems  Constitutional: Negative for fever.  HENT: Negative for rhinorrhea.   Eyes: Negative for pain.  Respiratory: Negative for cough and shortness of breath.   Cardiovascular: Negative for chest pain.  Gastrointestinal: Positive for vomiting and abdominal pain. Negative for nausea and diarrhea.  Genitourinary: Positive for dysuria and hematuria.  Musculoskeletal: Negative for back pain.  Skin: Negative for rash.  Neurological: Negative for weakness and headaches.    Allergies  Gluten  Home Medications   Current Outpatient Rx  Name Route Sig Dispense Refill  . ALPRAZOLAM 0.5 MG PO TABS Oral Take 0.5 mg by mouth 4 (four) times daily as needed. For anxiety    . CITALOPRAM HYDROBROMIDE 20 MG PO TABS Oral Take 20 mg by mouth daily.      Marland Kitchen ESTRADIOL 1 MG PO TABS Oral Take 1 mg by mouth daily.      Marland Kitchen GABAPENTIN 300 MG PO CAPS Oral Take 300 mg by mouth 2 (two) times daily.    Marland Kitchen VERAPAMIL HCL 120 MG PO TBCR Oral Take 120 mg by mouth every morning.       BP 118/73  Pulse 86  Temp 98.3 F (36.8 C)  Resp 18  Ht 5\' 1"  (1.549 m)  Wt 184 lb (83.462 kg)  BMI 34.77  kg/m2  SpO2 100%  Physical Exam  Nursing note and vitals reviewed. Constitutional: She is oriented to person, place, and time. She appears well-developed and well-nourished. No distress.  HENT:  Head: Normocephalic and atraumatic.  Eyes: EOM are normal. Pupils are equal, round, and reactive to light.  Neck: Neck supple. No tracheal deviation present.  Cardiovascular: Normal rate.   Pulmonary/Chest: Effort normal. No respiratory distress.  Abdominal: Soft. She exhibits no distension. There is tenderness.       Tender in the left flank area  Musculoskeletal: Normal range of motion. She exhibits no edema.  Neurological: She is alert and oriented to person, place, and time. No sensory deficit.  Skin: Skin is warm and dry.  Psychiatric: She has a normal mood and affect. Her behavior is normal.     ED Course  Procedures (including critical care time)  DIAGNOSTIC STUDIES: Oxygen Saturation is 100% on room air, normal by my interpretation.    COORDINATION OF CARE: 1:36pm-Ordered x-ray and checked for urinary analysis.  Labs Reviewed  URINALYSIS, ROUTINE W REFLEX MICROSCOPIC - Abnormal; Notable for the following:    Hgb urine dipstick TRACE (*)    All other components within normal limits  URINE MICROSCOPIC-ADD ON - Abnormal; Notable for the following:    Squamous Epithelial / LPF FEW (*)    All other components within normal limits  BASIC METABOLIC PANEL - Abnormal; Notable for the following:    Potassium 3.3 (*)    Glucose, Bld 107 (*)    All other components within normal limits  CBC  DIFFERENTIAL   No results found.   No diagnosis found.    MDM  The chart was scribed for me under my direct supervision.  I personally performed the history, physical, and medical decision making and all procedures in the evaluation of this patient.Samantha Lennert, MD 09/28/11 1654  Samantha Lennert, MD 09/28/11 [phone]

## 2011-09-28 NOTE — ED Notes (Signed)
Vomiting that started yesterday, " thinks her kidneys are messing up", denies fever, +diarrhea.  +painful urination.

## 2011-09-30 LAB — URINE CULTURE
Colony Count: NO GROWTH
Culture: NO GROWTH

## 2011-10-16 ENCOUNTER — Emergency Department (HOSPITAL_COMMUNITY)
Admission: EM | Admit: 2011-10-16 | Discharge: 2011-10-17 | Disposition: A | Discharge status: Home / Self Care | Payer: Medicaid Other | Attending: Emergency Medicine | Admitting: Emergency Medicine

## 2011-10-16 ENCOUNTER — Encounter (HOSPITAL_COMMUNITY): Payer: Self-pay | Admitting: Emergency Medicine

## 2011-10-16 DIAGNOSIS — IMO0002 Reserved for concepts with insufficient information to code with codable children: Secondary | ICD-10-CM | POA: Insufficient documentation

## 2011-10-16 DIAGNOSIS — K900 Celiac disease: Secondary | ICD-10-CM | POA: Insufficient documentation

## 2011-10-16 DIAGNOSIS — T07XXXA Unspecified multiple injuries, initial encounter: Secondary | ICD-10-CM | POA: Insufficient documentation

## 2011-10-16 DIAGNOSIS — M25559 Pain in unspecified hip: Secondary | ICD-10-CM | POA: Insufficient documentation

## 2011-10-16 DIAGNOSIS — R51 Headache: Secondary | ICD-10-CM | POA: Insufficient documentation

## 2011-10-16 DIAGNOSIS — M25519 Pain in unspecified shoulder: Secondary | ICD-10-CM | POA: Insufficient documentation

## 2011-10-16 DIAGNOSIS — Z79899 Other long term (current) drug therapy: Secondary | ICD-10-CM | POA: Insufficient documentation

## 2011-10-16 DIAGNOSIS — Y92410 Unspecified street and highway as the place of occurrence of the external cause: Secondary | ICD-10-CM | POA: Insufficient documentation

## 2011-10-16 DIAGNOSIS — M25569 Pain in unspecified knee: Secondary | ICD-10-CM | POA: Insufficient documentation
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
## 2011-10-17 ENCOUNTER — Emergency Department (HOSPITAL_COMMUNITY): Payer: Medicaid Other

## 2011-10-17 LAB — BASIC METABOLIC PANEL
Calcium: 9 mg/dL (ref 8.4–10.5)
GFR calc non Af Amer: >90 mL/min (ref >90)
Sodium: 136 mEq/L (ref 135–145)

## 2011-10-17 LAB — CBC
MCH: 29.6 pg (ref 26.0–34.0)
MCHC: 35.6 g/dL (ref 30.0–36.0)
Platelets: 203 10*3/uL (ref 150–400)

## 2011-10-17 MED ORDER — IOHEXOL 300 MG/ML  SOLN
100.0000 mL | Freq: Once | INTRAMUSCULAR | Status: AC | PRN
  Administered 2011-10-17: 100 mL via INTRAVENOUS

## 2011-10-17 MED ORDER — CYCLOBENZAPRINE HCL 10 MG PO TABS: 10.0000 mg | ORAL_TABLET | Freq: Two times a day (BID) | ORAL | Status: AC | PRN

## 2011-10-17 MED ORDER — HYDROCODONE-ACETAMINOPHEN 5-500 MG PO TABS: 1.0000 | ORAL_TABLET | Freq: Four times a day (QID) | ORAL | Status: AC | PRN

## 2011-10-17 MED ORDER — SODIUM CHLORIDE 0.9 % IV SOLN
INTRAVENOUS | Status: DC
  Administered 2011-10-17: via INTRAVENOUS

## 2011-10-17 MED ORDER — FENTANYL CITRATE 0.05 MG/ML IJ SOLN
50.0000 ug | Freq: Once | INTRAMUSCULAR | Status: AC
  Administered 2011-10-17: 50 ug via INTRAVENOUS
  Filled 2011-10-17: qty 2

## 2011-10-17 MED ORDER — ONDANSETRON HCL 4 MG/2ML IJ SOLN
4.0000 mg | Freq: Once | INTRAMUSCULAR | Status: AC
  Administered 2011-10-17: 4 mg via INTRAVENOUS
  Filled 2011-10-17: qty 2

## 2011-10-17 MED ORDER — TETANUS-DIPHTH-ACELL PERTUSSIS 5-2.5-18.5 LF-MCG/0.5 IM SUSP
0.5000 mL | Freq: Once | INTRAMUSCULAR | Status: AC
  Administered 2011-10-17: 0.5 mL via INTRAMUSCULAR
  Filled 2011-10-17: qty 0.5

## 2011-10-17 MED ORDER — IBUPROFEN 600 MG PO TABS: 600.0000 mg | ORAL_TABLET | Freq: Four times a day (QID) | ORAL | Status: AC | PRN

## 2011-10-17 NOTE — ED Notes (Signed)
Pt back from CT
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
## 2011-10-17 NOTE — Discharge Instructions (Signed)
Abrasions °Abrasions are skin scrapes. Their treatment depends on how large and deep the abrasion is. Abrasions do not extend through all layers of the skin. A cut or lesion through all skin layers is called a laceration. °HOME CARE INSTRUCTIONS  °· If you were given a dressing, change it at least once a day or as instructed by your caregiver. If the bandage sticks, soak it off with a solution of water or hydrogen peroxide.  °· Twice a day, wash the area with soap and water to remove all the cream/ointment. You may do this in a sink, under a tub faucet, or in a shower. Rinse off the soap and pat dry with a clean towel. Look for signs of infection (see below).  °· Reapply cream/ointment according to your caregiver's instruction. This will help prevent infection and keep the bandage from sticking. Telfa or gauze over the wound and under the dressing or wrap will also help keep the bandage from sticking.  °· If the bandage becomes wet, dirty, or develops a foul smell, change it as soon as possible.  °· Only take over-the-counter or prescription medicines for pain, discomfort, or fever as directed by your caregiver.  °SEEK IMMEDIATE MEDICAL CARE IF:  °· Increasing pain in the wound.  °· Signs of infection develop: redness, swelling, surrounding area is tender to touch, or pus coming from the wound.  °· You have a fever.  °· Any foul smell coming from the wound or dressing.  °Most skin wounds heal within ten days. Facial wounds heal faster. However, an infection may occur despite proper treatment. You should have the wound checked for signs of infection within 24 to 48 hours or sooner if problems arise. If you were not given a wound-check appointment, look closely at the wound yourself on the second day for early signs of infection listed above. °MAKE SURE YOU:  °· Understand these instructions.  °· Will watch your condition.  °· Will get help right away if you are not doing well or get worse.  °Document Released:  02/19/2005 Document Revised: 05/01/2011 Document Reviewed: 04/15/2011 °[employer]® Patient Information ©2012 [employer].Contusion °A contusion is a deep bruise. Contusions are the result of an injury that caused bleeding under the skin. The contusion may turn blue, purple, or yellow. Minor injuries will give you a painless contusion, but more severe contusions may stay painful and swollen for a few weeks.  °CAUSES  °A contusion is usually caused by a blow, trauma, or direct force to an area of the body. °SYMPTOMS  °· Swelling and redness of the injured area.  °· Bruising of the injured area.  °· Tenderness and soreness of the injured area.  °· Pain.  °DIAGNOSIS  °The diagnosis can be made by taking a history and physical exam. An X-ray, CT scan, or MRI may be needed to determine if there were any associated injuries, such as fractures. °TREATMENT  °Specific treatment will depend on what area of the body was injured. In general, the best treatment for a contusion is resting, icing, elevating, and applying cold compresses to the injured area. Over-the-counter medicines may also be recommended for pain control. Ask your caregiver what the best treatment is for your contusion. °HOME CARE INSTRUCTIONS  °· Put ice on the injured area.  °· Put ice in a plastic bag.  °· Place a towel between your skin and the bag.  °· Leave the ice on for 15 to 20 minutes, 3 to 4 times a day.  °·   Only take over-the-counter or prescription medicines for pain, discomfort, or fever as directed by your caregiver. Your caregiver may recommend avoiding anti-inflammatory medicines (aspirin, ibuprofen, and naproxen) for 48 hours because these medicines may increase bruising.  °· Rest the injured area.  °· If possible, elevate the injured area to reduce swelling.  °SEEK IMMEDIATE MEDICAL CARE IF:  °· You have increased bruising or swelling.  °· You have pain that is getting worse.  °· Your swelling or pain is not relieved with medicines.  °MAKE  SURE YOU:  °· Understand these instructions.  °· Will watch your condition.  °· Will get help right away if you are not doing well or get worse.  °Document Released: 02/19/2005 Document Revised: 05/01/2011 Document Reviewed: 03/17/2011 °[employer]® Patient Information ©2012 [employer].Contusion °A contusion is a deep bruise. Contusions are the result of an injury that caused bleeding under the skin. The contusion may turn blue, purple, or yellow. Minor injuries will give you a painless contusion, but more severe contusions may stay painful and swollen for a few weeks.  °CAUSES  °A contusion is usually caused by a blow, trauma, or direct force to an area of the body. °SYMPTOMS  °· Swelling and redness of the injured area.  °· Bruising of the injured area.  °· Tenderness and soreness of the injured area.  °· Pain.  °DIAGNOSIS  °The diagnosis can be made by taking a history and physical exam. An X-ray, CT scan, or MRI may be needed to determine if there were any associated injuries, such as fractures. °TREATMENT  °Specific treatment will depend on what area of the body was injured. In general, the best treatment for a contusion is resting, icing, elevating, and applying cold compresses to the injured area. Over-the-counter medicines may also be recommended for pain control. Ask your caregiver what the best treatment is for your contusion. °HOME CARE INSTRUCTIONS  °· Put ice on the injured area.  °· Put ice in a plastic bag.  °· Place a towel between your skin and the bag.  °· Leave the ice on for 15 to 20 minutes, 3 to 4 times a day.  °· Only take over-the-counter or prescription medicines for pain, discomfort, or fever as directed by your caregiver. Your caregiver may recommend avoiding anti-inflammatory medicines (aspirin, ibuprofen, and naproxen) for 48 hours because these medicines may increase bruising.  °· Rest the injured area.  °· If possible, elevate the injured area to reduce swelling.  °SEEK IMMEDIATE  MEDICAL CARE IF:  °· You have increased bruising or swelling.  °· You have pain that is getting worse.  °· Your swelling or pain is not relieved with medicines.  °MAKE SURE YOU:  °· Understand these instructions.  °· Will watch your condition.  °· Will get help right away if you are not doing well or get worse.  °Document Released: 02/19/2005 Document Revised: 05/01/2011 Document Reviewed: 03/17/2011 °[employer]® Patient Information ©2012 [employer]. °

## 2011-10-17 NOTE — ED Notes (Signed)
In and out cath performed per patient request, due to patient stating she wanted to empty her bladder, but couldn't urinate. Dr. Merrily Pew in and out catheter.

## 2011-10-29 ENCOUNTER — Encounter (HOSPITAL_COMMUNITY): Payer: Self-pay

## 2011-10-29 ENCOUNTER — Emergency Department (HOSPITAL_COMMUNITY)
Admission: EM | Admit: 2011-10-29 | Discharge: 2011-10-29 | Disposition: A | Discharge status: Home / Self Care | Payer: Self-pay | Attending: Emergency Medicine | Admitting: Emergency Medicine

## 2011-10-29 DIAGNOSIS — M545 Low back pain, unspecified: Secondary | ICD-10-CM | POA: Insufficient documentation

## 2011-10-29 DIAGNOSIS — R55 Syncope and collapse: Secondary | ICD-10-CM | POA: Insufficient documentation

## 2011-10-29 DIAGNOSIS — R51 Headache: Secondary | ICD-10-CM | POA: Insufficient documentation

## 2011-10-29 DIAGNOSIS — F411 Generalized anxiety disorder: Secondary | ICD-10-CM | POA: Insufficient documentation

## 2011-10-29 LAB — BASIC METABOLIC PANEL
CO2: 27 mEq/L (ref 19–32)
GFR calc non Af Amer: >90 mL/min (ref >90)
Glucose, Bld: 94 mg/dL (ref 70–99)
Potassium: 3.2 mEq/L — ABNORMAL LOW (ref 3.5–5.1)
Sodium: 138 mEq/L (ref 135–145)

## 2011-10-29 LAB — URINALYSIS, ROUTINE W REFLEX MICROSCOPIC
Hgb urine dipstick: NEGATIVE
Leukocytes, UA: NEGATIVE
Specific Gravity, Urine: <1.005 — ABNORMAL LOW (ref 1.005–1.030)
Urobilinogen, UA: 0.2 mg/dL (ref 0.0–1.0)

## 2011-10-29 LAB — RAPID URINE DRUG SCREEN, HOSP PERFORMED
Amphetamines: NOT DETECTED
Opiates: NOT DETECTED

## 2011-10-29 LAB — CBC
Platelets: 224 10*3/uL (ref 150–400)
RBC: 4.13 MIL/uL (ref 3.87–5.11)
WBC: 9.7 10*3/uL (ref 4.0–10.5)

## 2011-10-29 LAB — PREGNANCY, URINE: Preg Test, Ur: NEGATIVE

## 2011-10-29 LAB — DIFFERENTIAL
Eosinophils Absolute: 0.6 10*3/uL (ref 0.0–0.7)
Lymphocytes Relative: 20 % (ref 12–46)
Lymphs Abs: 1.9 10*3/uL (ref 0.7–4.0)
Neutrophils Relative %: 68 % (ref 43–77)

## 2011-10-29 MED ORDER — SODIUM CHLORIDE 0.9 % IV SOLN
INTRAVENOUS | Status: DC
  Administered 2011-10-29: 17:00:00 via INTRAVENOUS

## 2011-10-29 MED ORDER — HYDROCODONE-ACETAMINOPHEN 5-325 MG PO TABS: 1.0000 | ORAL_TABLET | ORAL | Status: AC | PRN

## 2011-10-29 MED ORDER — LORAZEPAM 2 MG/ML IJ SOLN
1.0000 mg | Freq: Once | INTRAMUSCULAR | Status: AC
  Administered 2011-10-29: 1 mg via INTRAVENOUS
  Filled 2011-10-29: qty 1

## 2011-10-29 MED ORDER — HYDROCODONE-ACETAMINOPHEN 5-325 MG PO TABS
1.0000 | ORAL_TABLET | Freq: Once | ORAL | Status: AC
  Administered 2011-10-29: 1 via ORAL
  Filled 2011-10-29: qty 1
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
## 2011-10-29 NOTE — Discharge Instructions (Signed)
Samantha [name], you had a fainting spell today. Fortunately, your EKG and laboratory tests were all good. It is safe to go home. You should rest at home tonight and should have recovered fully by the morning.  You can have followup if necessary with Dr. Juanetta Gosling, your internal medicine doctor.
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
## 2011-10-29 NOTE — ED Notes (Signed)
Left in c/o friend for transport home; alert, in no distress; answers questions appropriately; instructions/prescription reviewed and f/u information provided. Verbalizes understanding.

## 2011-10-29 NOTE — ED Notes (Signed)
Resting in no distress; respirations even, non-labored; VSS
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
## 2012-01-23 ENCOUNTER — Encounter (HOSPITAL_COMMUNITY): Payer: Self-pay | Admitting: *Deleted

## 2012-01-23 ENCOUNTER — Emergency Department (HOSPITAL_COMMUNITY): Payer: Self-pay

## 2012-01-23 ENCOUNTER — Emergency Department (HOSPITAL_COMMUNITY)
Admission: EM | Admit: 2012-01-23 | Discharge: 2012-01-23 | Disposition: A | Discharge status: Home / Self Care | Payer: Self-pay | Attending: Emergency Medicine | Admitting: Emergency Medicine

## 2012-01-23 DIAGNOSIS — M7989 Other specified soft tissue disorders: Secondary | ICD-10-CM | POA: Insufficient documentation

## 2012-01-23 DIAGNOSIS — R609 Edema, unspecified: Secondary | ICD-10-CM | POA: Insufficient documentation

## 2012-01-23 LAB — CBC WITH DIFFERENTIAL/PLATELET
Basophils Absolute: 0 10*3/uL (ref 0.0–0.1)
Basophils Relative: 0 % (ref 0–1)
Eosinophils Absolute: 0.1 10*3/uL (ref 0.0–0.7)
Hemoglobin: 12.3 g/dL (ref 12.0–15.0)
MCH: 29.7 pg (ref 26.0–34.0)
MCHC: 35 g/dL (ref 30.0–36.0)
Monocytes Relative: 10 % (ref 3–12)
Neutro Abs: 2.5 10*3/uL (ref 1.7–7.7)
Neutrophils Relative %: 52 % (ref 43–77)
RDW: 14.2 % (ref 11.5–15.5)

## 2012-01-23 LAB — URINALYSIS, ROUTINE W REFLEX MICROSCOPIC
Glucose, UA: NEGATIVE mg/dL
Hgb urine dipstick: NEGATIVE
Leukocytes, UA: NEGATIVE
pH: 6 (ref 5.0–8.0)

## 2012-01-23 LAB — COMPREHENSIVE METABOLIC PANEL
AST: 28 U/L (ref 0–37)
Albumin: 3.7 g/dL (ref 3.5–5.2)
BUN: 15 mg/dL (ref 6–23)
Chloride: 103 mEq/L (ref 96–112)
Creatinine, Ser: 0.55 mg/dL (ref 0.50–1.10)
Potassium: 3.5 mEq/L (ref 3.5–5.1)
Total Protein: 7 g/dL (ref 6.0–8.3)

## 2012-01-23 LAB — APTT: aPTT: 30 seconds (ref 24–37)

## 2012-01-23 LAB — PROTIME-INR
INR: 0.96 (ref 0.00–1.49)
Prothrombin Time: 13 seconds (ref 11.6–15.2)

## 2012-01-23 LAB — URIC ACID: Uric Acid, Serum: 4.2 mg/dL (ref 2.4–7.0)

## 2012-01-23 MED ORDER — POTASSIUM CHLORIDE ER 10 MEQ PO TBCR: 10.0000 meq | EXTENDED_RELEASE_TABLET | ORAL | Status: DC

## 2012-01-23 MED ORDER — HYDROCODONE-ACETAMINOPHEN 5-325 MG PO TABS: 2.0000 | ORAL_TABLET | ORAL | Status: AC | PRN

## 2012-01-23 MED ORDER — FUROSEMIDE 40 MG PO TABS
40.0000 mg | ORAL_TABLET | Freq: Once | ORAL | Status: AC
  Administered 2012-01-23: 40 mg via ORAL
  Filled 2012-01-23: qty 1

## 2012-01-23 MED ORDER — FUROSEMIDE 40 MG PO TABS: 40.0000 mg | ORAL_TABLET | Freq: Every day | ORAL | Status: DC

## 2012-01-23 MED ORDER — POTASSIUM CHLORIDE CRYS ER 20 MEQ PO TBCR
10.0000 meq | EXTENDED_RELEASE_TABLET | Freq: Two times a day (BID) | ORAL | Status: DC
  Filled 2012-01-23: qty 1

## 2012-01-23 MED ORDER — HYDROCODONE-ACETAMINOPHEN 5-325 MG PO TABS
1.0000 | ORAL_TABLET | Freq: Once | ORAL | Status: AC
  Administered 2012-01-23: 1 via ORAL
  Filled 2012-01-23: qty 1

## 2012-01-23 NOTE — ED Notes (Signed)
Bil lower leg swelling and pain for 4 days. No injury

## 2012-01-23 NOTE — ED Notes (Signed)
Patient with no complaints at this time. Respirations even and unlabored. Skin warm/dry. Discharge instructions reviewed with patient at this time. Patient given opportunity to voice concerns/ask questions. IV removed per policy and band-aid applied to site. Patient discharged at this time and left Emergency Department with steady gait.  

## 2012-01-23 NOTE — ED Notes (Signed)
Patient unable to urinate at this time. 

## 2012-01-23 NOTE — ED Provider Notes (Signed)
History    This chart was scribed for Osvaldo Human, MD, MD by Smitty Pluck. The patient was seen in room APA04 and the patient's care was started at 2:56PM.   CSN: [id number]  Arrival date & time 01/23/12  1350   First MD Initiated Contact with Patient 01/23/12 1439      Chief Complaint  Patient presents with  . Leg Swelling    (Consider location/radiation/quality/duration/timing/severity/associated sxs/prior treatment) The history is provided by the patient.   Samantha [name] is a 36 y.o. female who presents to the Emergency Department complaining of constant, moderate bilateral leg and feet swelling onset 4 days ago. Pt reports that it feels like she is walking on hot coals. Pt denies hx of blood clots. She has hx of celiac disease.  She is on Estrace hormonal replacement post hysterectomy.  Pt reports that she smokes 6 cigarettes/day. Denies drinking alcohol and using drugs. Pt has family hx of rheumatoid arthritis. Pt denies injury to legs or feet.   Past Medical History  Diagnosis Date  . Celiac disease   . Interstitial cystitis   . Arrhythmia   . DDD (degenerative disc disease)   . Kidney stones   . Kidney stones   . Kidney stone     Past Surgical History  Procedure Date  . Cesarean section   . Tonsillectomy   . Appendectomy   . Abdominal hysterectomy   . Hernia repair   . Adenoidectomy   . Kidney stone surgery     History reviewed. No pertinent family history.  History  Substance Use Topics  . Smoking status: Current Some Day Smoker -- 0.5 packs/day    Types: Cigarettes  . Smokeless tobacco: Not on file  . Alcohol Use: No    OB History    Grav Para Term Preterm Abortions TAB SAB Ect Mult Living                  Review of Systems  All other systems reviewed and are negative.  10 Systems reviewed and all are negative for acute change except as noted in the HPI.    Allergies  Gluten  Home Medications   Current Outpatient Rx  Name Route  Sig Dispense Refill  . ALPRAZOLAM 0.5 MG PO TABS Oral Take 0.5 mg by mouth 4 (four) times daily as needed. For anxiety    . CITALOPRAM HYDROBROMIDE 20 MG PO TABS Oral Take 20 mg by mouth daily.      Marland Kitchen ESTRADIOL 1 MG PO TABS Oral Take 1 mg by mouth daily.      Marland Kitchen GABAPENTIN 300 MG PO CAPS Oral Take 300 mg by mouth 2 (two) times daily.    . IBUPROFEN 600 MG PO TABS Oral Take 600 mg by mouth every 6 (six) hours as needed. For pain    . PROMETHAZINE HCL 25 MG PO TABS Oral Take 1 tablet (25 mg total) by mouth every 6 (six) hours as needed for nausea. 12 tablet 0  . PROMETHAZINE HCL 25 MG PO TABS Oral Take 1 tablet (25 mg total) by mouth every 6 (six) hours as needed for nausea. 15 tablet 0  . VERAPAMIL HCL 120 MG PO TBCR Oral Take 120 mg by mouth every morning.       BP 119/75  Pulse 89  Temp 98.5 F (36.9 C) (Oral)  Resp 20  Ht 5\' 1"  (1.549 m)  Wt 190 lb (86.183 kg)  BMI 35.90 kg/m2  SpO2 100%  Physical Exam  Nursing note and vitals reviewed. Constitutional: She is oriented to person, place, and time. She appears well-developed and well-nourished. No distress.  HENT:  Head: Normocephalic and atraumatic.  Neck: Normal range of motion. Neck supple.  Cardiovascular: Normal rate, regular rhythm and normal heart sounds.   No murmur heard. Pulmonary/Chest: Effort normal. No respiratory distress. She has no wheezes. She has no rales.  Abdominal: She exhibits no distension. There is no tenderness. There is no rebound.  Musculoskeletal: She exhibits edema (+3 bilateral feet, ankle and legs to knee  ).  Neurological: She is alert and oriented to person, place, and time.  Skin: Skin is warm and dry.  Psychiatric: She has a normal mood and affect. Her behavior is normal.    ED Course  Procedures (including critical care time) DIAGNOSTIC STUDIES: Oxygen Saturation is 100% on room air, normal by my interpretation.    COORDINATION OF CARE:  3:11 PM Pt seen --> physical exam performed.  Lab  workup ordered.  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  ST/T Wave abnormalities: normal  Conduction Disutrbances:none  Narrative Interpretation:   Old EKG Reviewed: unchanged  Results for orders placed during the hospital encounter of 01/23/12  CBC WITH DIFFERENTIAL      Component Value Range   WBC 4.7  4.0 - 10.5 K/uL   RBC 4.14  3.87 - 5.11 MIL/uL   Hemoglobin 12.3  12.0 - 15.0 g/dL   HCT 19.1 (*) 47.8 - 29.5 %   MCV 84.8  78.0 - 100.0 fL   MCH 29.7  26.0 - 34.0 pg   MCHC 35.0  30.0 - 36.0 g/dL   RDW 62.1  30.8 - 65.7 %   Platelets 179  150 - 400 K/uL   Neutrophils Relative 52  43 - 77 %   Neutro Abs 2.5  1.7 - 7.7 K/uL   Lymphocytes Relative 37  12 - 46 %   Lymphs Abs 1.8  0.7 - 4.0 K/uL   Monocytes Relative 10  3 - 12 %   Monocytes Absolute 0.5  0.1 - 1.0 K/uL   Eosinophils Relative 1  0 - 5 %   Eosinophils Absolute 0.1  0.0 - 0.7 K/uL   Basophils Relative 0  0 - 1 %   Basophils Absolute 0.0  0.0 - 0.1 K/uL  COMPREHENSIVE METABOLIC PANEL      Component Value Range   Sodium 137  135 - 145 mEq/L   Potassium 3.5  3.5 - 5.1 mEq/L   Chloride 103  96 - 112 mEq/L   CO2 26  19 - 32 mEq/L   Glucose, Bld 79  70 - 99 mg/dL   BUN 15  6 - 23 mg/dL   Creatinine, Ser 8.46  0.50 - 1.10 mg/dL   Calcium 9.4  8.4 - 96.2 mg/dL   Total Protein 7.0  6.0 - 8.3 g/dL   Albumin 3.7  3.5 - 5.2 g/dL   AST 28  0 - 37 U/L   ALT 32  0 - 35 U/L   Alkaline Phosphatase 55  39 - 117 U/L   Total Bilirubin 0.2 (*) 0.3 - 1.2 mg/dL   GFR calc non Af Amer >90  >90 mL/min   GFR calc Af Amer >90  >90 mL/min  PROTIME-INR  Component Value Range   Prothrombin Time 13.0  11.6 - 15.2 seconds   INR 0.96  0.00 - 1.49  APTT      Component Value Range   aPTT 30  24 - 37 seconds  URIC ACID      Component Value Range    Uric Acid, Serum 4.2  2.4 - 7.0 mg/dL  PRO B NATRIURETIC PEPTIDE      Component Value Range   Pro B Natriuretic peptide (BNP) 5.0  0 - 125 pg/mL   Dg Chest 2 View  01/23/2012  *RADIOLOGY REPORT*  Clinical Data: Bilateral lower leg edema.  CHEST - 2 VIEW  Comparison: 10/17/2011  Findings: Cardiac and mediastinal contours appear normal.  The lungs appear clear.  No pleural effusion is identified.  IMPRESSION:  No significant abnormality identified.   Original Report Authenticated By: Dellia Cloud, M.D.    US Venous Img Lower Bilateral  01/23/2012  *RADIOLOGY REPORT*  Clinical data:  3-day history of bilateral lower extremity pain and swelling.  BILATERAL LOWER EXTREMITY VENOUS DOPPLER ULTRASOUND 01/23/2012:  Technique:  Gray-scale sonography with compression, as well as color and duplex Doppler ultrasound, were performed to evaluate the deep venous system from the level of the common femoral vein through the popliteal and proximal calf veins. Evaluation also included physiologic response to augmentation.  Comparison: None.  Findings: All of the visualized deep veins in both lower extremities demonstrate normal Doppler signal, normal compressibility, normal phasicity, and normal physiologic response to augmentation.  No gray scale filling defects were identified. The visualized greater saphenous veins are similarly patent in both lower extremities.  IMPRESSION: No evidence of DVT involving either the right or left lower extremity.   Original Report Authenticated By: Arnell Sieving, M.D.     4:45 PM Pt advised of normal lab, EKG, x-ray and venous doppler results.  Rheumatoid factor test is pending. Rx Lasix 40 mg qd for peripheral edema, KCl 10 meq per day to replace potassium, and hydrocodone-acetaminophen q4h prn pain.  F/U with Dr. Juanetta Gosling, her internist, next week in the office.       1. Peripheral edema     I personally performed the services described in this documentation,  which was scribed in my presence. The recorded information has been reviewed and considered.  Osvaldo Human, MD     Carleene Cooper III, MD 01/23/12 [phone]

## 2012-01-24 LAB — RHEUMATOID FACTOR: Rhuematoid fact SerPl-aCnc: <10 IU/mL (ref <=14)

## 2012-06-17 ENCOUNTER — Encounter: Payer: Self-pay | Admitting: Pulmonary Disease

## 2012-07-08 ENCOUNTER — Encounter (HOSPITAL_COMMUNITY): Payer: Self-pay | Admitting: *Deleted

## 2012-07-08 ENCOUNTER — Emergency Department (HOSPITAL_COMMUNITY)
Admission: EM | Admit: 2012-07-08 | Discharge: 2012-07-08 | Disposition: A | Discharge status: Home / Self Care | Payer: Self-pay | Attending: Emergency Medicine | Admitting: Emergency Medicine

## 2012-07-08 DIAGNOSIS — R110 Nausea: Secondary | ICD-10-CM | POA: Insufficient documentation

## 2012-07-08 DIAGNOSIS — Z8719 Personal history of other diseases of the digestive system: Secondary | ICD-10-CM | POA: Insufficient documentation

## 2012-07-08 DIAGNOSIS — Y92009 Unspecified place in unspecified non-institutional (private) residence as the place of occurrence of the external cause: Secondary | ICD-10-CM | POA: Insufficient documentation

## 2012-07-08 DIAGNOSIS — Y93E6 Activity, residential relocation: Secondary | ICD-10-CM | POA: Insufficient documentation

## 2012-07-08 DIAGNOSIS — Z87442 Personal history of urinary calculi: Secondary | ICD-10-CM | POA: Insufficient documentation

## 2012-07-08 DIAGNOSIS — W57XXXA Bitten or stung by nonvenomous insect and other nonvenomous arthropods, initial encounter: Secondary | ICD-10-CM

## 2012-07-08 DIAGNOSIS — Z87448 Personal history of other diseases of urinary system: Secondary | ICD-10-CM | POA: Insufficient documentation

## 2012-07-08 DIAGNOSIS — Z8679 Personal history of other diseases of the circulatory system: Secondary | ICD-10-CM | POA: Insufficient documentation

## 2012-07-08 DIAGNOSIS — IMO0001 Reserved for inherently not codable concepts without codable children: Secondary | ICD-10-CM | POA: Insufficient documentation

## 2012-07-08 DIAGNOSIS — R209 Unspecified disturbances of skin sensation: Secondary | ICD-10-CM | POA: Insufficient documentation

## 2012-07-08 DIAGNOSIS — Z8739 Personal history of other diseases of the musculoskeletal system and connective tissue: Secondary | ICD-10-CM | POA: Insufficient documentation

## 2012-07-08 DIAGNOSIS — F17200 Nicotine dependence, unspecified, uncomplicated: Secondary | ICD-10-CM | POA: Insufficient documentation

## 2012-07-08 MED ORDER — ONDANSETRON 4 MG PO TBDP
4.0000 mg | ORAL_TABLET | Freq: Once | ORAL | Status: AC
  Administered 2012-07-08: 4 mg via ORAL
  Filled 2012-07-08: qty 1

## 2012-07-08 MED ORDER — ONDANSETRON 4 MG PO TBDP: 4.0000 mg | ORAL_TABLET | Freq: Three times a day (TID) | ORAL | Status: DC | PRN

## 2012-07-08 MED ORDER — IBUPROFEN 600 MG PO TABS: 600.0000 mg | ORAL_TABLET | Freq: Four times a day (QID) | ORAL | Status: DC | PRN

## 2012-07-08 NOTE — ED Notes (Signed)
The pt thinks she may have been bitten by a spider yesterday in an old house.  She has a red spot on her rt forearm and she vomited today

## 2012-07-08 NOTE — ED Notes (Signed)
Pt discharged.Vital signs stable and GCS 15 

## 2012-07-08 NOTE — ED Provider Notes (Signed)
  Medical screening examination/treatment/procedure(s) were performed by non-physician practitioner and as supervising physician I was immediately available for consultation/collaboration.    Gerhard Munch, MD 07/08/12 2314

## 2012-07-08 NOTE — ED Notes (Signed)
Pt c/o red small lesion that she noticed last night, sts redness has increased and arm pain begun 4/10 throbbing and "electric"

## 2012-07-08 NOTE — ED Provider Notes (Signed)
History    This chart was scribed for non-physician practitioner working with Gerhard Munch, MD by Frederik Pear, ED Scribe. This patient was seen in room TR09C/TR09C and the patient's care was started at 2103.   CSN: [id number]  Arrival date & time 07/08/12  2027   First MD Initiated Contact with Patient 07/08/12 2103      Chief Complaint  Patient presents with  . Rash    (Consider location/radiation/quality/duration/timing/severity/associated sxs/prior treatment) Patient is a 37 y.o. female presenting with animal bite. The history is provided by the patient. No language interpreter was used.  Animal Bite  The incident occurred today. The incident occurred at another residence. There is an injury to the right forearm. The pain is moderate. It is unlikely that a foreign body is present. There have been no prior injuries to these areas. There were no sick contacts. She has received no recent medical care.    Samantha [name] is a 37 y.o. female who presents to the Emergency Department complaining of a sudden onset, gradually worsening, throbbing, constant wound to the right forearm with gradually worsening erythema and numbness with associated nausea that she first noticed last night. She states that she thinks that she may have been bitten by a spider yesterday when she was helping her next door neighbors move yesterday. She reports seeing a black spider before the bite. Her husband also reports 2 episodes of emesis earlier in the day.  Previous PCP was Dr. Allena Napoleon.    Past Medical History  Diagnosis Date  . Celiac disease   . Interstitial cystitis   . Arrhythmia   . DDD (degenerative disc disease)   . Kidney stones   . Kidney stones   . Kidney stone     Past Surgical History  Procedure Laterality Date  . Cesarean section    . Tonsillectomy    . Appendectomy    . Abdominal hysterectomy    . Hernia repair    . Adenoidectomy    . Kidney stone surgery      No  family history on file.  History  Substance Use Topics  . Smoking status: Current Some Day Smoker -- 0.50 packs/day    Types: Cigarettes  . Smokeless tobacco: Not on file  . Alcohol Use: No    OB History   Grav Para Term Preterm Abortions TAB SAB Ect Mult Living                  Review of Systems  Skin: Positive for wound.  All other systems reviewed and are negative.    Allergies  Gluten  Home Medications  No current outpatient prescriptions on file.  BP 114/82  Pulse 81  Temp(Src) 97.7 F (36.5 C) (Oral)  Resp 16  SpO2 99%  Physical Exam  Nursing note and vitals reviewed. Constitutional: She is oriented to person, place, and time. She appears well-developed and well-nourished. No distress.  HENT:  Head: Normocephalic and atraumatic.  Eyes: EOM are normal. Pupils are equal, round, and reactive to light.  Neck: Normal range of motion. Neck supple. No tracheal deviation present.  Cardiovascular: Normal rate.   Pulmonary/Chest: Effort normal. No respiratory distress.  Abdominal: Soft. She exhibits no distension.  Musculoskeletal: Normal range of motion. She exhibits no edema.  Neurological: She is alert and oriented to person, place, and time.  Skin: Skin is warm and dry. Lesion noted.  There is a 1 cm sized circular, reddened area with minimal induration, no  ulceration, and no drainage. There is tenderness along the lateral aspect of the radial vein without redness and warmth.  Psychiatric: She has a normal mood and affect. Her behavior is normal.    ED Course  Procedures (including critical care time)  DIAGNOSTIC STUDIES: Oxygen Saturation is 99% on room air, normal by my interpretation.    COORDINATION OF CARE:  21:35- Discussed planned course of treatment with the patient, who is agreeable at this time.   Labs Reviewed - No data to display No results found.   No diagnosis found.  Patient with possible spider bite to right forearm.  No indication  of active infection.  Patient is reporting nausea with occasional vomiting, but may be due to anxiety.  Afebrile, normo-tensive. Return precautions discussed.  MDM    I personally performed the services described in this documentation, which was scribed in my presence. The recorded information has been reviewed and is accurate.        Jimmye Norman, NP 07/08/12 2308

## 2012-07-22 IMAGING — CR DG WRIST COMPLETE 3+V*R*
2 series · 2 of 2 positions shown · non-contrast
Comparison: Right wrist x-rays 02/10/2004.  Right hand x-rays
09/15/2010.

CLINICAL DATA: Fell and injured right wrist.

RIGHT WRIST - COMPLETE 3+ VIEW 10/04/2010:

[view not recorded (1 of 2)]
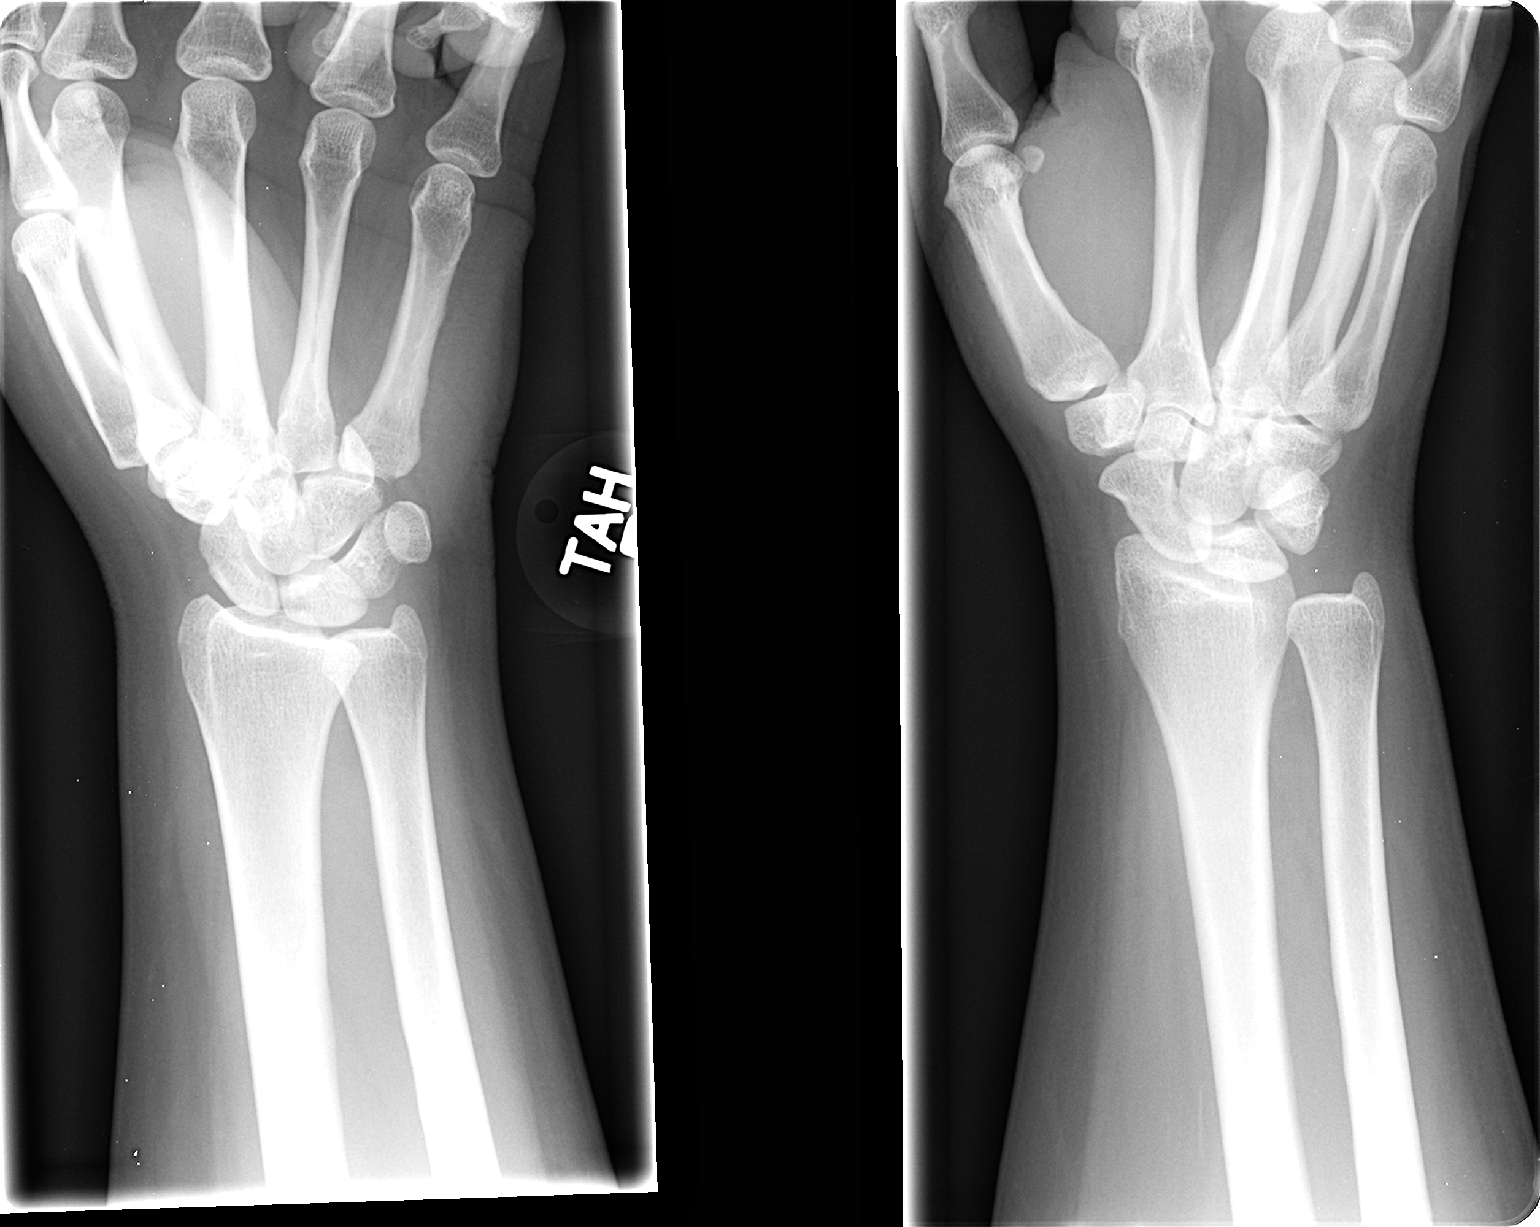

[view not recorded (2 of 2)]
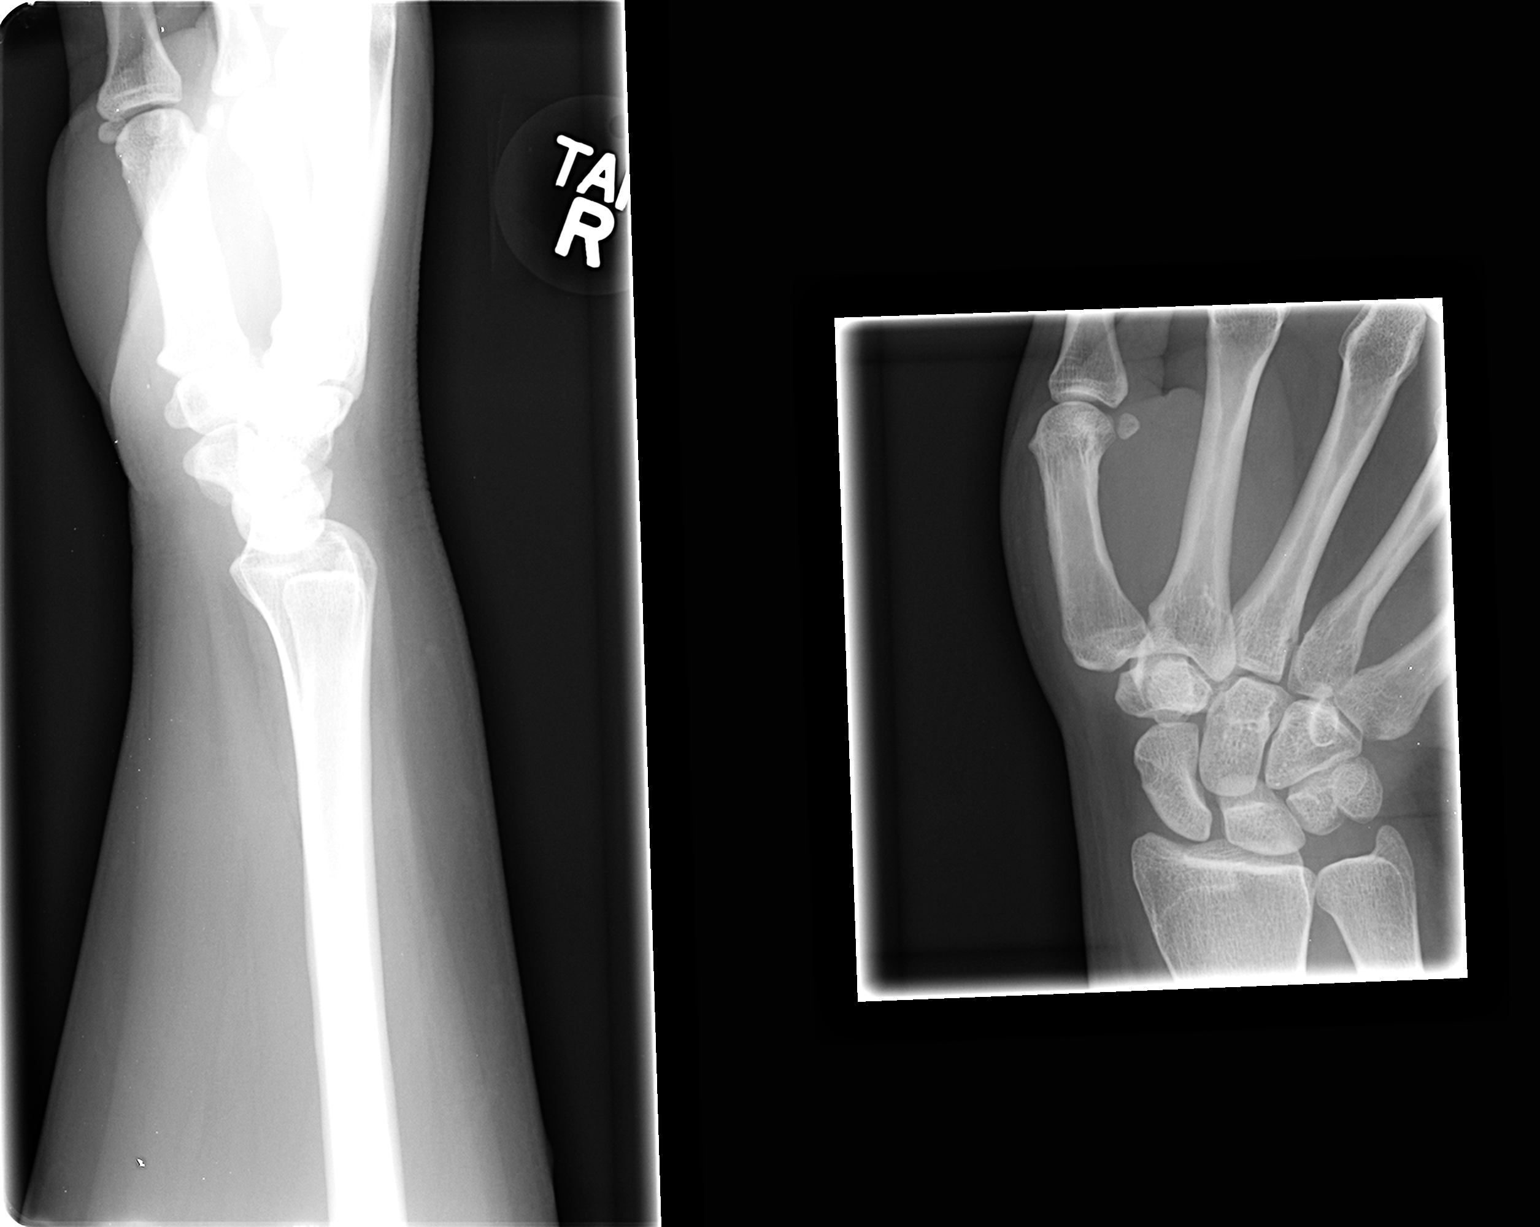

[2 of 2 positions shown; findings below may reference images not displayed]

FINDINGS: No evidence of acute or subacute fracture or dislocation.
Well-preserved joint spaces.  Well-preserved bone mineral density.
No intrinsic osseous abnormalities.  No significant interval
change.
IMPRESSION: Normal examination.

## 2012-08-16 IMAGING — XA DG ABDOMEN 1V
1 series · 6 of 6 positions shown · non-contrast
Comparison: 10/25/2010

CLINICAL DATA: Left side pain, retrograde pyelography

ABDOMEN - 1 VIEW

[Series 1: run · 3 acquisitions, 6 frames shown]
[im 1/3]
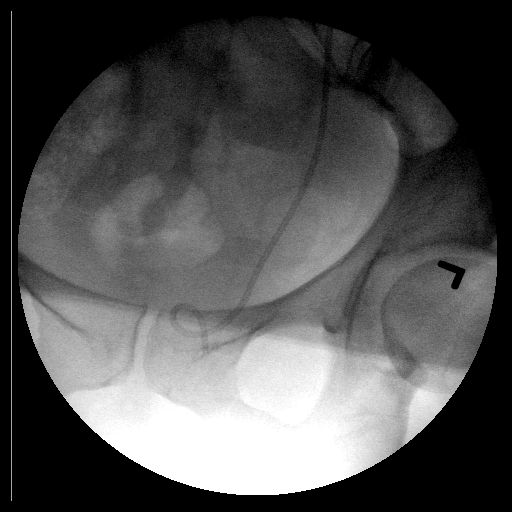
[im 1/3]
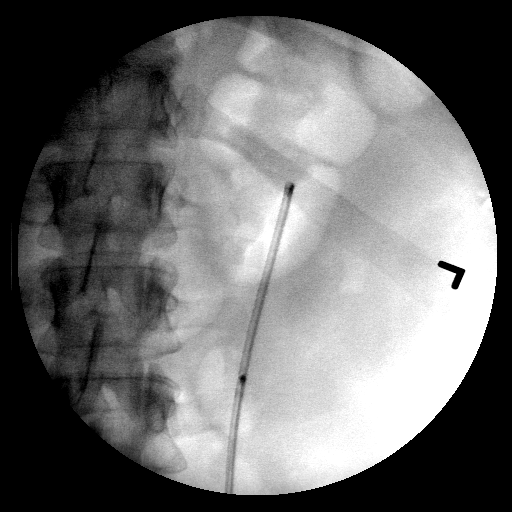
[im 1/3]
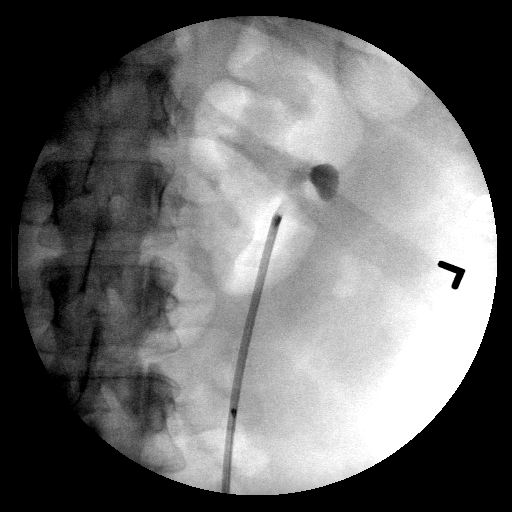
[im 1/3]
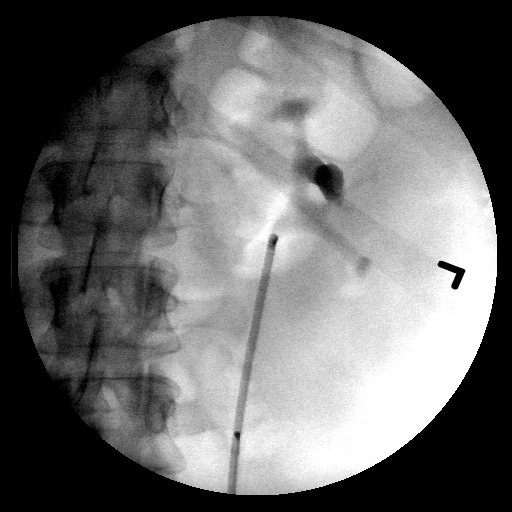
[im 2/3]
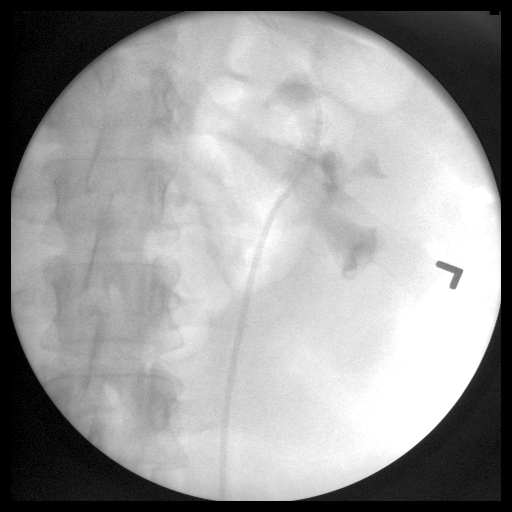
[im 3/3]
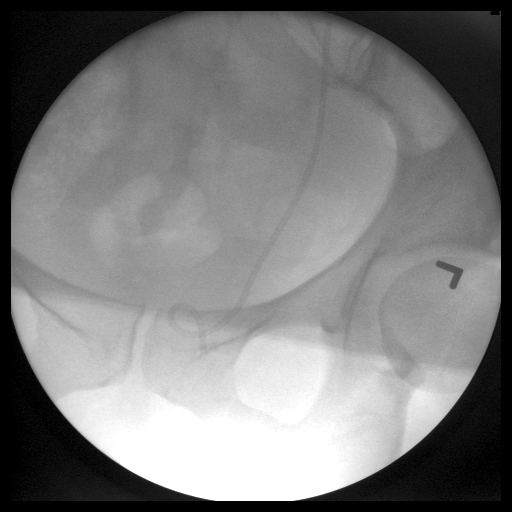

[6 of 6 positions shown; findings below may reference images not displayed]

FINDINGS: Limited views performed.
48 seconds of fluoroscopy utilized intraoperatively.
Partial opacification of a normal sized left renal collecting
system.
Remainder of the left renal collecting system and ureter not imaged
with contrast.
Left ureteral stent placed, extending from upper pole region of
left kidney to urinary bladder.
IMPRESSION: Left ureteral stent placement.

## 2013-02-01 DIAGNOSIS — F32A Depression, unspecified: Secondary | ICD-10-CM | POA: Insufficient documentation

## 2013-02-04 DIAGNOSIS — R4589 Other symptoms and signs involving emotional state: Secondary | ICD-10-CM | POA: Insufficient documentation

## 2013-10-07 ENCOUNTER — Emergency Department (HOSPITAL_COMMUNITY)
Admission: EM | Admit: 2013-10-07 | Discharge: 2013-10-07 | Disposition: A | Discharge status: Home / Self Care | Payer: Medicaid Other | Attending: Emergency Medicine | Admitting: Emergency Medicine

## 2013-10-07 ENCOUNTER — Encounter (HOSPITAL_COMMUNITY): Payer: Self-pay | Admitting: Emergency Medicine

## 2013-10-07 ENCOUNTER — Emergency Department (HOSPITAL_COMMUNITY): Payer: Medicaid Other

## 2013-10-07 DIAGNOSIS — Y9289 Other specified places as the place of occurrence of the external cause: Secondary | ICD-10-CM | POA: Insufficient documentation

## 2013-10-07 DIAGNOSIS — Z8679 Personal history of other diseases of the circulatory system: Secondary | ICD-10-CM | POA: Insufficient documentation

## 2013-10-07 DIAGNOSIS — Z87448 Personal history of other diseases of urinary system: Secondary | ICD-10-CM | POA: Insufficient documentation

## 2013-10-07 DIAGNOSIS — W208XXA Other cause of strike by thrown, projected or falling object, initial encounter: Secondary | ICD-10-CM | POA: Insufficient documentation

## 2013-10-07 DIAGNOSIS — Z79899 Other long term (current) drug therapy: Secondary | ICD-10-CM | POA: Insufficient documentation

## 2013-10-07 DIAGNOSIS — Z8719 Personal history of other diseases of the digestive system: Secondary | ICD-10-CM | POA: Insufficient documentation

## 2013-10-07 DIAGNOSIS — F17200 Nicotine dependence, unspecified, uncomplicated: Secondary | ICD-10-CM | POA: Insufficient documentation

## 2013-10-07 DIAGNOSIS — S60229A Contusion of unspecified hand, initial encounter: Secondary | ICD-10-CM | POA: Insufficient documentation

## 2013-10-07 DIAGNOSIS — IMO0002 Reserved for concepts with insufficient information to code with codable children: Secondary | ICD-10-CM | POA: Insufficient documentation

## 2013-10-07 DIAGNOSIS — Z8739 Personal history of other diseases of the musculoskeletal system and connective tissue: Secondary | ICD-10-CM | POA: Insufficient documentation

## 2013-10-07 DIAGNOSIS — Z87442 Personal history of urinary calculi: Secondary | ICD-10-CM | POA: Insufficient documentation

## 2013-10-07 DIAGNOSIS — Y9389 Activity, other specified: Secondary | ICD-10-CM | POA: Insufficient documentation

## 2013-10-07 DIAGNOSIS — R109 Unspecified abdominal pain: Secondary | ICD-10-CM | POA: Insufficient documentation

## 2013-10-07 DIAGNOSIS — Z791 Long term (current) use of non-steroidal anti-inflammatories (NSAID): Secondary | ICD-10-CM | POA: Insufficient documentation

## 2013-10-07 DIAGNOSIS — S60221A Contusion of right hand, initial encounter: Secondary | ICD-10-CM

## 2013-10-07 MED ORDER — ACETAMINOPHEN-CODEINE #3 300-30 MG PO TABS
2.0000 | ORAL_TABLET | Freq: Once | ORAL | Status: AC
  Administered 2013-10-07: 2 via ORAL
  Filled 2013-10-07: qty 2

## 2013-10-07 MED ORDER — ONDANSETRON HCL 4 MG PO TABS
4.0000 mg | ORAL_TABLET | Freq: Once | ORAL | Status: AC
  Administered 2013-10-07: 4 mg via ORAL
  Filled 2013-10-07: qty 1

## 2013-10-07 MED ORDER — KETOROLAC TROMETHAMINE 10 MG PO TABS
10.0000 mg | ORAL_TABLET | Freq: Once | ORAL | Status: AC
  Administered 2013-10-07: 10 mg via ORAL
  Filled 2013-10-07: qty 1

## 2013-10-07 MED ORDER — MELOXICAM 7.5 MG PO TABS: ORAL_TABLET | ORAL | Status: DC

## 2013-10-07 MED ORDER — ACETAMINOPHEN-CODEINE #3 300-30 MG PO TABS: 1.0000 | ORAL_TABLET | Freq: Four times a day (QID) | ORAL | Status: DC | PRN
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
## 2013-10-07 NOTE — ED Provider Notes (Signed)
Medical screening examination/treatment/procedure(s) were performed by non-physician practitioner and as supervising physician I was immediately available for consultation/collaboration.   EKG Interpretation None        Ezequiel Essex, MD 10/07/13 2326

## 2013-10-07 NOTE — ED Provider Notes (Signed)
CSN: [id number]     Arrival date & time 10/07/13  1434 History   First MD Initiated Contact with Patient 10/07/13 1457     Chief Complaint  Patient presents with  . Hand Pain     (Consider location/radiation/quality/duration/timing/severity/associated sxs/prior Treatment) HPI Comments: Patient is a 38 year old female who presents to the emergency department with complaint of right hand pain. The patient states that she was moving the decorative rocks off of her truck when one of them rolled and mashed her hand. She presents to the emergency department because she is having increasing pain. The patient denies being on any blood thinning type medications. She's not had any operations or previous procedures involving the right hand.  Patient is a 38 y.o. female presenting with hand pain. The history is provided by the patient.  Hand Pain Pertinent negatives include no abdominal pain, arthralgias, chest pain, coughing or neck pain.    Past Medical History  Diagnosis Date  . Celiac disease   . Interstitial cystitis   . Arrhythmia   . DDD (degenerative disc disease)   . Kidney stones   . Kidney stones   . Kidney stone    Past Surgical History  Procedure Laterality Date  . Cesarean section    . Tonsillectomy    . Appendectomy    . Abdominal hysterectomy    . Hernia repair    . Adenoidectomy    . Kidney stone surgery     No family history on file. History  Substance Use Topics  . Smoking status: Current Some Day Smoker -- 0.50 packs/day    Types: Cigarettes  . Smokeless tobacco: Not on file  . Alcohol Use: No   OB History   Grav Para Term Preterm Abortions TAB SAB Ect Mult Living                 Review of Systems  Constitutional: Negative for activity change.       All ROS Neg except as noted in HPI  HENT: Negative for nosebleeds.   Eyes: Negative for photophobia and discharge.  Respiratory: Negative for cough, shortness of breath and wheezing.   Cardiovascular:  Negative for chest pain and palpitations.  Gastrointestinal: Negative for abdominal pain and blood in stool.  Genitourinary: Positive for flank pain. Negative for dysuria, frequency and hematuria.  Musculoskeletal: Positive for back pain. Negative for arthralgias and neck pain.  Skin: Negative.   Neurological: Negative for dizziness, seizures and speech difficulty.  Psychiatric/Behavioral: Negative for hallucinations and confusion.      Allergies  Gluten  Home Medications   Prior to Admission medications   Medication Sig Start Date End Date Taking? Authorizing Provider  acetaminophen-codeine (TYLENOL #3) 300-30 MG per tablet Take 1-2 tablets by mouth every 6 (six) hours as needed. 10/07/13   Lenox Ahr, PA-C  ibuprofen (ADVIL,MOTRIN) 600 MG tablet Take 1 tablet (600 mg total) by mouth every 6 (six) hours as needed for pain. 07/08/12   Norman Herrlich, NP  meloxicam (MOBIC) 7.5 MG tablet 1 po bid with food 10/07/13   Lenox Ahr, PA-C  ondansetron (ZOFRAN ODT) 4 MG disintegrating tablet Take 1 tablet (4 mg total) by mouth every 8 (eight) hours as needed for nausea. 07/08/12   Norman Herrlich, NP   BP 118/76  Pulse 84  Temp(Src) 98.1 F (36.7 C) (Oral)  Resp 22  Ht 5\' 1"  (1.549 m)  Wt 190 lb (86.183 kg)  BMI 35.92 kg/m2  SpO2 100%  Physical Exam  Nursing note and vitals reviewed. Constitutional: She is oriented to person, place, and time. She appears well-developed and well-nourished.  Non-toxic appearance.  HENT:  Head: Normocephalic.  Right Ear: Tympanic membrane and external ear normal.  Left Ear: Tympanic membrane and external ear normal.  Eyes: EOM and lids are normal. Pupils are equal, round, and reactive to light.  Neck: Normal range of motion. Neck supple. Carotid bruit is not present.  Cardiovascular: Normal rate, regular rhythm, normal heart sounds, intact distal pulses and normal pulses.   Pulmonary/Chest: Breath sounds normal. No respiratory distress.   Abdominal: Soft. Bowel sounds are normal. There is no tenderness. There is no guarding.  Musculoskeletal: Normal range of motion.  There is full range of motion of the right shoulder and elbow. There's no deformity of the right forearm. There is a blister and bruising of the dorsum of the right hand. There is some swelling of the fingers. There is blistering of the right ring finger. There is soreness of the thenar eminence, but no deformity. There is good movement of the fingers, but with some soreness.  Lymphadenopathy:       Head (right side): No submandibular adenopathy present.       Head (left side): No submandibular adenopathy present.    She has no cervical adenopathy.  Neurological: She is alert and oriented to person, place, and time. She has normal strength. No cranial nerve deficit or sensory deficit.  No motor or sensory deficits appreciated of the right upper extremity.  Skin: Skin is warm and dry.  Psychiatric: She has a normal mood and affect. Her speech is normal.    ED Course  Procedures (including critical care time) Labs Review Labs Reviewed - No data to display  Imaging Review Dg Hand Complete Right  10/07/2013   CLINICAL DATA:  Hand injury, pain and swelling  EXAM: RIGHT HAND - COMPLETE 3+ VIEW  COMPARISON:  12/16/2010  FINDINGS: Dorsal soft tissue swelling on the lateral view. Normal alignment. No underlying fracture. Preserved joint spaces. No significant arthropathy. No radiopaque foreign body.  IMPRESSION: Soft tissue swelling without acute osseous finding   Electronically Signed   By: Daryll Brod M.D.   On: 10/07/2013 15:36     EKG Interpretation None      MDM X-ray of the right hand is negative for fracture, dislocation, foreign body. The vital signs are well within normal limits. The pulse oximetry is 100% on room air. Within normal limits by my interpretation. These findings have been discussed with the patient in terms which he understands. The patient  was fitted with a Watson-Jones splint. An ice pack is been provided. A prescription for Mobic and Tylenol codeine have been given to the patient for her discomfort. She's been advised to elevate the hand is much as possible.   Final diagnoses:  Contusion of hand, right    *I have reviewed nursing notes, vital signs, and all appropriate lab and imaging results for this patient.Lenox Ahr, PA-C 10/07/13 [phone]

## 2013-10-07 NOTE — Discharge Instructions (Signed)
Your x-ray is negative for fracture or dislocation. Please leave the splint in place for the next 4 or 5 days. Please apply ice today and tomorrow. Please keep your hand elevated above your heart is much as possible to improve the swelling, and to prevent some much pain. Please use Mobic 2 times daily with food. May use Tylenol codeine every 6 hours if needed for pain. This medication may cause drowsiness, please use with caution. Hand Contusion A hand contusion is a deep bruise on your hand area. Contusions are the result of an injury that caused bleeding under the skin. The contusion may turn blue, purple, or yellow. Minor injuries will give you a painless contusion, but more severe contusions may stay painful and swollen for a few weeks. CAUSES  A contusion is usually caused by a blow, trauma, or direct force to an area of the body. SYMPTOMS   Swelling and redness of the injured area.  Discoloration of the injured area.  Tenderness and soreness of the injured area.  Pain. DIAGNOSIS  The diagnosis can be made by taking a history and performing a physical exam. An X-ray, CT scan, or MRI may be needed to determine if there were any associated injuries, such as broken bones (fractures). TREATMENT  Often, the best treatment for a hand contusion is resting, elevating, icing, and applying cold compresses to the injured area. Over-the-counter medicines may also be recommended for pain control. HOME CARE INSTRUCTIONS   Put ice on the injured area.  Put ice in a plastic bag.  Place a towel between your skin and the bag.  Leave the ice on for 15-20 minutes, 03-04 times a day.  Only take over-the-counter or prescription medicines as directed by your caregiver. Your caregiver may recommend avoiding anti-inflammatory medicines (aspirin, ibuprofen, and naproxen) for 48 hours because these medicines may increase bruising.  If told, use an elastic wrap as directed. This can help reduce swelling. You  may remove the wrap for sleeping, showering, and bathing. If your fingers become numb, cold, or blue, take the wrap off and reapply it more loosely.  Elevate your hand with pillows to reduce swelling.  Avoid overusing your hand if it is painful. SEEK IMMEDIATE MEDICAL CARE IF:   You have increased redness, swelling, or pain in your hand.  Your swelling or pain is not relieved with medicines.  You have loss of feeling in your hand or are unable to move your fingers.  Your hand turns cold or blue.  You have pain when you move your fingers.  Your hand becomes warm to the touch.  Your contusion does not improve in 2 days. MAKE SURE YOU:   Understand these instructions.  Will watch your condition.  Will get help right away if you are not doing well or get worse. Document Released: 11/01/2001 Document Revised: 02/04/2012 Document Reviewed: 11/03/2011 [facility] Patient Information 2014 [place].

## 2014-02-03 ENCOUNTER — Encounter (HOSPITAL_COMMUNITY): Payer: Self-pay | Admitting: Emergency Medicine

## 2014-02-03 ENCOUNTER — Emergency Department (HOSPITAL_COMMUNITY)
Admission: EM | Admit: 2014-02-03 | Discharge: 2014-02-04 | Disposition: A | Discharge status: Home / Self Care | Payer: Medicaid Other | Attending: Emergency Medicine | Admitting: Emergency Medicine

## 2014-02-03 DIAGNOSIS — S335XXA Sprain of ligaments of lumbar spine, initial encounter: Principal | ICD-10-CM

## 2014-02-03 DIAGNOSIS — F17200 Nicotine dependence, unspecified, uncomplicated: Secondary | ICD-10-CM | POA: Insufficient documentation

## 2014-02-03 DIAGNOSIS — Y929 Unspecified place or not applicable: Secondary | ICD-10-CM | POA: Insufficient documentation

## 2014-02-03 DIAGNOSIS — Z79899 Other long term (current) drug therapy: Secondary | ICD-10-CM | POA: Insufficient documentation

## 2014-02-03 DIAGNOSIS — Y939 Activity, unspecified: Secondary | ICD-10-CM | POA: Insufficient documentation

## 2014-02-03 DIAGNOSIS — IMO0002 Reserved for concepts with insufficient information to code with codable children: Secondary | ICD-10-CM | POA: Insufficient documentation

## 2014-02-03 DIAGNOSIS — S39012A Strain of muscle, fascia and tendon of lower back, initial encounter: Secondary | ICD-10-CM

## 2014-02-03 DIAGNOSIS — S339XXA Sprain of unspecified parts of lumbar spine and pelvis, initial encounter: Secondary | ICD-10-CM | POA: Insufficient documentation

## 2014-02-03 DIAGNOSIS — X503XXA Overexertion from repetitive movements, initial encounter: Secondary | ICD-10-CM | POA: Insufficient documentation

## 2014-02-03 DIAGNOSIS — Z87442 Personal history of urinary calculi: Secondary | ICD-10-CM | POA: Insufficient documentation

## 2014-02-03 NOTE — ED Notes (Signed)
Pt was moving and picked up a box and heard a pop in her back.

## 2014-02-04 MED ORDER — IBUPROFEN 600 MG PO TABS: 600.0000 mg | ORAL_TABLET | Freq: Four times a day (QID) | ORAL | Status: DC | PRN

## 2014-02-04 MED ORDER — KETOROLAC TROMETHAMINE 60 MG/2ML IM SOLN
60.0000 mg | Freq: Once | INTRAMUSCULAR | Status: AC
  Administered 2014-02-04: 60 mg via INTRAMUSCULAR
  Filled 2014-02-04: qty 2

## 2014-02-04 MED ORDER — OXYCODONE-ACETAMINOPHEN 5-325 MG PO TABS: 1.0000 | ORAL_TABLET | Freq: Four times a day (QID) | ORAL | Status: DC | PRN

## 2014-02-04 MED ORDER — OXYCODONE-ACETAMINOPHEN 5-325 MG PO TABS
1.0000 | ORAL_TABLET | Freq: Once | ORAL | Status: AC
  Administered 2014-02-04: 1 via ORAL
  Filled 2014-02-04: qty 1

## 2014-02-04 NOTE — ED Notes (Signed)
Pt has a history of chronic back pain (degenerative disk issues); usually takes 10mg  of hydrocodone PRN but states she hasn't taken any in the last 2 months. Pt states the problem stated today after moving furniture; pt states pain is a 3/10 at rest and intensified by ambulation.

## 2014-02-04 NOTE — Discharge Instructions (Signed)

## 2014-02-04 NOTE — ED Provider Notes (Signed)
CSN: [id number]     Arrival date & time 02/03/14  2111 History   None  This chart was scribed for Samantha Hacker, MD by Rosary Lively, ED scribe. This patient was seen in room [facility] and the patient's care was started at 12:30 AM.    Chief Complaint  Patient presents with  . Back Pain   Patient is a 38 y.o. female presenting with back pain. The history is provided by the patient. No language interpreter was used.  Back Pain Associated symptoms: no fever, no numbness and no weakness    HPI Comments:  Samantha [name] is a 38 y.o. female with a h/o back issues who presents to the Emergency Department complaining of severe, constant back pain, onset 2 days ago. Pt reports that her back began to hurt more than usual, after moving, and doing repetitive lifting. Pt reports that she is having trouble standing up straight while ambulating. Pt denies that pain radiates. Pt reports that pain is currently a 4/10. Pt reports that she took tylenol an advil, without relief. Patient denies any difficulty with bowel or bladder she denies any recent fevers or drug use.  Past Medical History  Diagnosis Date  . Celiac disease   . Interstitial cystitis   . Arrhythmia   . DDD (degenerative disc disease)   . Kidney stones   . Kidney stones   . Kidney stone    Past Surgical History  Procedure Laterality Date  . Cesarean section    . Tonsillectomy    . Appendectomy    . Abdominal hysterectomy    . Hernia repair    . Adenoidectomy    . Kidney stone surgery     No family history on file. History  Substance Use Topics  . Smoking status: Current Some Day Smoker -- 0.50 packs/day    Types: Cigarettes  . Smokeless tobacco: Not on file  . Alcohol Use: No   OB History   Grav Para Term Preterm Abortions TAB SAB Ect Mult Living                 Review of Systems  Constitutional: Negative for fever.  Genitourinary: Negative for difficulty urinating.  Musculoskeletal: Positive for back pain.  Negative for gait problem.  Neurological: Negative for weakness and numbness.  All other systems reviewed and are negative.     Allergies  Gluten  Home Medications   Prior to Admission medications   Medication Sig Start Date End Date Taking? Authorizing Provider  acetaminophen-codeine (TYLENOL #3) 300-30 MG per tablet Take 1-2 tablets by mouth every 6 (six) hours as needed. 10/07/13   Lenox Ahr, PA-C  ibuprofen (ADVIL,MOTRIN) 600 MG tablet Take 1 tablet (600 mg total) by mouth every 6 (six) hours as needed for pain. 07/08/12   Norman Herrlich, NP  ibuprofen (ADVIL,MOTRIN) 600 MG tablet Take 1 tablet (600 mg total) by mouth every 6 (six) hours as needed. 02/04/14   Samantha Hacker, MD  meloxicam (MOBIC) 7.5 MG tablet 1 po bid with food 10/07/13   Lenox Ahr, PA-C  ondansetron (ZOFRAN ODT) 4 MG disintegrating tablet Take 1 tablet (4 mg total) by mouth every 8 (eight) hours as needed for nausea. 07/08/12   Norman Herrlich, NP  oxyCODONE-acetaminophen (PERCOCET/ROXICET) 5-325 MG per tablet Take 1 tablet by mouth every 6 (six) hours as needed for severe pain. 02/04/14   Samantha Hacker, MD   BP 110/73  Pulse 64  Temp(Src) 97.8  F (36.6 C) (Oral)  Resp 18  Ht 5\' 1"  (1.549 m)  Wt 193 lb (87.544 kg)  BMI 36.49 kg/m2  SpO2 100% Physical Exam  Nursing note and vitals reviewed. Constitutional: She is oriented to person, place, and time. No distress.  Overweight  HENT:  Head: Normocephalic and atraumatic.  Cardiovascular: Normal rate, regular rhythm and normal heart sounds.   No murmur heard. Pulmonary/Chest: Effort normal and breath sounds normal. No respiratory distress. She has no wheezes.  Musculoskeletal:  Lower lumbar tenderness to palpation without step off or deformity, paraspinous muscle tenderness noted as well  Neurological: She is alert and oriented to person, place, and time.  5 out of 5 strength in all 4 extremities, normal gait  Skin: Skin is warm and dry.   Psychiatric: She has a normal mood and affect.    ED Course  Procedures  DIAGNOSTIC STUDIES: Oxygen Saturation is 98% on RA, normal by my interpretation.   Labs Review Labs Reviewed - No data to display  Imaging Review No results found.   EKG Interpretation None      MDM   Final diagnoses:  Lumbosacral strain, initial encounter    Patient presents with lumbosacral pain following lifting. Reports history of degenerative disc disease but acute worsening of pain. Denies any radiation of pain or neurologic deficits. No evidence of cauda equina and neurologically intact. Patient given ibuprofen and Percocet for pain management. No indication for imaging at this time. Patient will be discharged home with a short course of narcotic pain medication. She was also encouraged to continue anti-inflammatories and do back stretches. Patient stated understanding.  After history, exam, and medical workup I feel the patient has been appropriately medically screened and is safe for discharge home. Pertinent diagnoses were discussed with the patient. Patient was given return precautions.   I personally performed the services described in this documentation, which was scribed in my presence. The recorded information has been reviewed and is accurate.    Samantha Hacker, MD 02/04/14 [phone]

## 2014-02-16 ENCOUNTER — Emergency Department (HOSPITAL_COMMUNITY)
Admission: EM | Admit: 2014-02-16 | Discharge: 2014-02-16 | Disposition: A | Discharge status: Home / Self Care | Payer: Medicaid Other | Attending: Emergency Medicine | Admitting: Emergency Medicine

## 2014-02-16 ENCOUNTER — Encounter (HOSPITAL_COMMUNITY): Payer: Self-pay | Admitting: Emergency Medicine

## 2014-02-16 ENCOUNTER — Emergency Department (HOSPITAL_COMMUNITY): Payer: Medicaid Other

## 2014-02-16 DIAGNOSIS — R05 Cough: Secondary | ICD-10-CM | POA: Insufficient documentation

## 2014-02-16 DIAGNOSIS — Z87442 Personal history of urinary calculi: Secondary | ICD-10-CM | POA: Insufficient documentation

## 2014-02-16 DIAGNOSIS — Z8719 Personal history of other diseases of the digestive system: Secondary | ICD-10-CM | POA: Insufficient documentation

## 2014-02-16 DIAGNOSIS — R059 Cough, unspecified: Secondary | ICD-10-CM | POA: Insufficient documentation

## 2014-02-16 DIAGNOSIS — R630 Anorexia: Secondary | ICD-10-CM | POA: Insufficient documentation

## 2014-02-16 DIAGNOSIS — J40 Bronchitis, not specified as acute or chronic: Secondary | ICD-10-CM | POA: Insufficient documentation

## 2014-02-16 DIAGNOSIS — J069 Acute upper respiratory infection, unspecified: Secondary | ICD-10-CM | POA: Insufficient documentation

## 2014-02-16 DIAGNOSIS — Z8679 Personal history of other diseases of the circulatory system: Secondary | ICD-10-CM | POA: Insufficient documentation

## 2014-02-16 DIAGNOSIS — R0789 Other chest pain: Secondary | ICD-10-CM | POA: Insufficient documentation

## 2014-02-16 DIAGNOSIS — Z8739 Personal history of other diseases of the musculoskeletal system and connective tissue: Secondary | ICD-10-CM | POA: Insufficient documentation

## 2014-02-16 DIAGNOSIS — F17200 Nicotine dependence, unspecified, uncomplicated: Secondary | ICD-10-CM | POA: Insufficient documentation

## 2014-02-16 MED ORDER — FEXOFENADINE-PSEUDOEPHED ER 60-120 MG PO TB12: 1.0000 | ORAL_TABLET | Freq: Two times a day (BID) | ORAL | Status: DC

## 2014-02-16 MED ORDER — PREDNISONE 10 MG PO TABS: ORAL_TABLET | ORAL | Status: DC

## 2014-02-16 MED ORDER — KETOROLAC TROMETHAMINE 10 MG PO TABS
10.0000 mg | ORAL_TABLET | Freq: Once | ORAL | Status: AC
  Administered 2014-02-16: 10 mg via ORAL
  Filled 2014-02-16: qty 1

## 2014-02-16 MED ORDER — PREDNISONE 50 MG PO TABS
60.0000 mg | ORAL_TABLET | Freq: Once | ORAL | Status: AC
  Administered 2014-02-16: 60 mg via ORAL
  Filled 2014-02-16 (×2): qty 1

## 2014-02-16 MED ORDER — PSEUDOEPHEDRINE HCL 60 MG PO TABS
60.0000 mg | ORAL_TABLET | Freq: Once | ORAL | Status: AC
  Administered 2014-02-16: 60 mg via ORAL
  Filled 2014-02-16: qty 1

## 2014-02-16 MED ORDER — ALBUTEROL SULFATE HFA 108 (90 BASE) MCG/ACT IN AERS
2.0000 | INHALATION_SPRAY | Freq: Once | RESPIRATORY_TRACT | Status: AC
  Administered 2014-02-16: 2 via RESPIRATORY_TRACT
  Filled 2014-02-16: qty 6.7

## 2014-02-16 MED ORDER — PROMETHAZINE-CODEINE 6.25-10 MG/5ML PO SYRP: 5.0000 mL | ORAL_SOLUTION | Freq: Four times a day (QID) | ORAL | Status: DC | PRN

## 2014-02-16 NOTE — ED Notes (Signed)
Having cold symptoms for two weeks. Not sure if she had a fever.  Had sore throat the first week and that went away.   Took OTC meds without relief.

## 2014-02-16 NOTE — ED Notes (Signed)
Pt states she has had cough and congestion for approximately a week and a half. States she has tried multiple OTC remedies but experienced no relief.

## 2014-02-16 NOTE — ED Provider Notes (Signed)
CSN: [id number]     Arrival date & time 02/16/14  1613 History   First MD Initiated Contact with Patient 02/16/14 1656     Chief Complaint  Patient presents with  . Cough  . Nasal Congestion     (Consider location/radiation/quality/duration/timing/severity/associated sxs/prior Treatment) Patient is a 38 y.o. female presenting with cough. The history is provided by the patient.  Cough Cough characteristics:  Non-productive Severity:  Moderate Onset quality:  Gradual Duration:  2 weeks Timing:  Intermittent Progression:  Worsening Chronicity:  New Smoker: no   Context: sick contacts and upper respiratory infection   Relieved by:  Nothing Ineffective treatments: otc meds. Associated symptoms: myalgias and sinus congestion   Associated symptoms: no chest pain, no eye discharge, no rash, no shortness of breath, no sore throat and no wheezing   Associated symptoms comment:  Chest wall pain Risk factors: no recent travel     Past Medical History  Diagnosis Date  . Celiac disease   . Interstitial cystitis   . Arrhythmia   . DDD (degenerative disc disease)   . Kidney stones   . Kidney stones   . Kidney stone    Past Surgical History  Procedure Laterality Date  . Cesarean section    . Tonsillectomy    . Appendectomy    . Abdominal hysterectomy    . Hernia repair    . Adenoidectomy    . Kidney stone surgery     History reviewed. No pertinent family history. History  Substance Use Topics  . Smoking status: Current Some Day Smoker -- 0.50 packs/day    Types: Cigarettes  . Smokeless tobacco: Not on file  . Alcohol Use: No   OB History   Grav Para Term Preterm Abortions TAB SAB Ect Mult Living                 Review of Systems  Constitutional: Positive for activity change and fatigue.       All ROS Neg except as noted in HPI  HENT: Positive for congestion. Negative for nosebleeds and sore throat.   Eyes: Negative for photophobia and discharge.  Respiratory:  Positive for cough and chest tightness. Negative for shortness of breath and wheezing.   Cardiovascular: Negative for chest pain and palpitations.  Gastrointestinal: Negative for abdominal pain and blood in stool.  Genitourinary: Negative for dysuria, frequency and hematuria.  Musculoskeletal: Positive for myalgias. Negative for arthralgias, back pain and neck pain.  Skin: Negative.  Negative for rash.  Neurological: Negative for dizziness, seizures and speech difficulty.  Psychiatric/Behavioral: Negative for hallucinations and confusion.      Allergies  Gluten  Home Medications   Prior to Admission medications   Medication Sig Start Date End Date Taking? Authorizing Provider  ibuprofen (ADVIL,MOTRIN) 600 MG tablet Take 1 tablet (600 mg total) by mouth every 6 (six) hours as needed. 02/04/14  Yes Merryl Hacker, MD  oxyCODONE-acetaminophen (PERCOCET/ROXICET) 5-325 MG per tablet Take 1 tablet by mouth every 6 (six) hours as needed for severe pain. 02/04/14  Yes Merryl Hacker, MD   BP 102/64  Pulse 62  Temp(Src) 98.5 F (36.9 C) (Oral)  Resp 20  Ht 5\' 1"  (1.549 m)  Wt 205 lb (92.987 kg)  BMI 38.75 kg/m2  SpO2 100% Physical Exam  Nursing note and vitals reviewed. Constitutional: She is oriented to person, place, and time. She appears well-developed and well-nourished.  Non-toxic appearance.  HENT:  Head: Normocephalic.  Right Ear: Tympanic membrane and  external ear normal.  Left Ear: Tympanic membrane and external ear normal.  Nasal congestion present.  Airway patent.  Eyes: EOM and lids are normal. Pupils are equal, round, and reactive to light.  Neck: Normal range of motion. Neck supple. Carotid bruit is not present.  Cardiovascular: Normal rate, regular rhythm, normal heart sounds, intact distal pulses and normal pulses.   Pulmonary/Chest: No respiratory distress. She has wheezes. She has rhonchi.  No retractions. Pt speaks in complete sentences.    Abdominal:  Soft. Bowel sounds are normal. There is no tenderness. There is no guarding.  Musculoskeletal: Normal range of motion.  Lymphadenopathy:       Head (right side): No submandibular adenopathy present.       Head (left side): No submandibular adenopathy present.    She has no cervical adenopathy.  Neurological: She is alert and oriented to person, place, and time. She has normal strength. No cranial nerve deficit or sensory deficit.  Skin: Skin is warm and dry.  Psychiatric: She has a normal mood and affect. Her speech is normal.    ED Course  Procedures (including critical care time) Labs Review Labs Reviewed - No data to display  Imaging Review Dg Chest 2 View  02/16/2014   CLINICAL DATA:  Cough.  EXAM: CHEST  2 VIEW  COMPARISON:  December 27, 2011.  FINDINGS: The heart size and mediastinal contours are within normal limits. Both lungs are clear. No pneumothorax or pleural effusion is noted. The visualized skeletal structures are unremarkable.  IMPRESSION: No acute cardiopulmonary abnormality seen.   Electronically Signed   By: Sabino Dick M.D.   On: 02/16/2014 16:54     EKG Interpretation None      MDM  Vital signs within normal limits. Pt speaks in complete sentences. And chest x-ray is read as negative.  Plan at this time is to use albuterol every 4 hours, prednisone taper, promethazine codeine cough medication, and ibuprofen. Patient advised to increase fluids. A mask has been provided for the patient to use.    Final diagnoses:  None    **I have reviewed nursing notes, vital signs, and all appropriate lab and imaging results for this patient.Lenox Ahr, PA-C 02/16/14 1740

## 2014-02-16 NOTE — Discharge Instructions (Signed)
Your chest x-ray is negative for pneumonia or acute problem. Your examination is consistent with bronchitis and upper respiratory infection. Please increase water, juices, Gatorade, etc. Please wash hands frequently. Please use a mask until symptoms have resolved. Use 2 puffs of albuterol every 4 hours.  Please use Allegra-D, prednisone, and ibuprofen daily. Use promethazine codeine cough medication every 6 hours if needed for cough. This medication may cause drowsiness, please use with caution.

## 2014-02-17 NOTE — ED Provider Notes (Signed)
Medical screening examination/treatment/procedure(s) were performed by non-physician practitioner and as supervising physician I was immediately available for consultation/collaboration.   EKG Interpretation None      Rolland Porter, MD, Abram Sander   Janice Norrie, MD 02/17/14 Lupita Shutter

## 2014-03-16 ENCOUNTER — Emergency Department (HOSPITAL_COMMUNITY)
Admission: EM | Admit: 2014-03-16 | Discharge: 2014-03-16 | Disposition: A | Discharge status: Home / Self Care | Payer: Medicaid Other | Attending: Emergency Medicine | Admitting: Emergency Medicine

## 2014-03-16 ENCOUNTER — Encounter (HOSPITAL_COMMUNITY): Payer: Self-pay | Admitting: Emergency Medicine

## 2014-03-16 DIAGNOSIS — Z87442 Personal history of urinary calculi: Secondary | ICD-10-CM | POA: Insufficient documentation

## 2014-03-16 DIAGNOSIS — Y929 Unspecified place or not applicable: Secondary | ICD-10-CM | POA: Insufficient documentation

## 2014-03-16 DIAGNOSIS — Y939 Activity, unspecified: Secondary | ICD-10-CM | POA: Insufficient documentation

## 2014-03-16 DIAGNOSIS — Z79899 Other long term (current) drug therapy: Secondary | ICD-10-CM | POA: Insufficient documentation

## 2014-03-16 DIAGNOSIS — Z8719 Personal history of other diseases of the digestive system: Secondary | ICD-10-CM | POA: Insufficient documentation

## 2014-03-16 DIAGNOSIS — Z8679 Personal history of other diseases of the circulatory system: Secondary | ICD-10-CM | POA: Insufficient documentation

## 2014-03-16 DIAGNOSIS — X58XXXA Exposure to other specified factors, initial encounter: Secondary | ICD-10-CM | POA: Insufficient documentation

## 2014-03-16 DIAGNOSIS — Z87891 Personal history of nicotine dependence: Secondary | ICD-10-CM | POA: Insufficient documentation

## 2014-03-16 DIAGNOSIS — Z87448 Personal history of other diseases of urinary system: Secondary | ICD-10-CM | POA: Insufficient documentation

## 2014-03-16 DIAGNOSIS — G8929 Other chronic pain: Secondary | ICD-10-CM | POA: Insufficient documentation

## 2014-03-16 DIAGNOSIS — S39012A Strain of muscle, fascia and tendon of lower back, initial encounter: Secondary | ICD-10-CM | POA: Insufficient documentation

## 2014-03-16 MED ORDER — ACETAMINOPHEN-CODEINE #3 300-30 MG PO TABS: 1.0000 | ORAL_TABLET | Freq: Four times a day (QID) | ORAL | Status: DC | PRN

## 2014-03-16 MED ORDER — DIAZEPAM 5 MG PO TABS
10.0000 mg | ORAL_TABLET | Freq: Once | ORAL | Status: AC
  Administered 2014-03-16: 10 mg via ORAL
  Filled 2014-03-16: qty 2

## 2014-03-16 MED ORDER — BACLOFEN 10 MG PO TABS
10.0000 mg | ORAL_TABLET | Freq: Three times a day (TID) | ORAL | Status: AC
Start: 2014-03-16 — End: 2014-04-15

## 2014-03-16 MED ORDER — ACETAMINOPHEN-CODEINE #3 300-30 MG PO TABS
1.0000 | ORAL_TABLET | Freq: Once | ORAL | Status: AC
  Administered 2014-03-16: 1 via ORAL
  Filled 2014-03-16: qty 1

## 2014-03-16 MED ORDER — KETOROLAC TROMETHAMINE 10 MG PO TABS
10.0000 mg | ORAL_TABLET | Freq: Once | ORAL | Status: AC
  Administered 2014-03-16: 10 mg via ORAL
  Filled 2014-03-16: qty 1

## 2014-03-16 MED ORDER — DICLOFENAC SODIUM 75 MG PO TBEC: 75.0000 mg | DELAYED_RELEASE_TABLET | Freq: Two times a day (BID) | ORAL | Status: DC

## 2014-03-16 MED ORDER — PREDNISONE 50 MG PO TABS
60.0000 mg | ORAL_TABLET | Freq: Once | ORAL | Status: AC
  Administered 2014-03-16: 60 mg via ORAL
  Filled 2014-03-16 (×2): qty 1

## 2014-03-16 NOTE — ED Notes (Signed)
Pt c/o lower back pain that started this afternoon after playing at the park with children, has hx of DDD

## 2014-03-16 NOTE — ED Provider Notes (Signed)
Medical screening examination/treatment/procedure(s) were performed by non-physician practitioner and as supervising physician I was immediately available for consultation/collaboration.   EKG Interpretation None        Orpah Greek, MD 03/16/14 2326

## 2014-03-16 NOTE — ED Provider Notes (Signed)
CSN: [id number]     Arrival date & time 03/16/14  1935 History   First MD Initiated Contact with Patient 03/16/14 1959     Chief Complaint  Patient presents with  . Back Pain     (Consider location/radiation/quality/duration/timing/severity/associated sxs/prior Treatment) Patient is a 38 y.o. female presenting with back pain. The history is provided by the patient.  Back Pain Location:  Lumbar spine Quality:  Aching, stiffness and cramping Stiffness is present:  All day Pain severity:  Moderate Onset quality:  Sudden Duration:  4 hours Timing:  Intermittent Progression:  Worsening Chronicity: acute on chronic pain. Context: twisting   Relieved by:  Nothing Ineffective treatments:  NSAIDs and lying down Associated symptoms: no bladder incontinence, no bowel incontinence, no numbness, no perianal numbness and no weakness   Risk factors comment:  Hx of DDD   Past Medical History  Diagnosis Date  . Celiac disease   . Interstitial cystitis   . Arrhythmia   . DDD (degenerative disc disease)   . Kidney stones   . Kidney stones   . Kidney stone    Past Surgical History  Procedure Laterality Date  . Cesarean section    . Tonsillectomy    . Appendectomy    . Abdominal hysterectomy    . Hernia repair    . Adenoidectomy    . Kidney stone surgery     No family history on file. History  Substance Use Topics  . Smoking status: Former Smoker -- 0.50 packs/day    Types: Cigarettes  . Smokeless tobacco: Not on file  . Alcohol Use: No   OB History   Grav Para Term Preterm Abortions TAB SAB Ect Mult Living                 Review of Systems  Gastrointestinal: Negative for bowel incontinence.  Genitourinary: Negative for bladder incontinence.  Musculoskeletal: Positive for back pain.  Neurological: Negative for weakness and numbness.      Allergies  Gluten  Home Medications   Prior to Admission medications   Medication Sig Start Date End Date Taking? Authorizing  Provider  acetaminophen (TYLENOL) 500 MG tablet Take 500 mg by mouth every 6 (six) hours as needed for mild pain, moderate pain or headache.   Yes Historical Provider, MD  albuterol (PROVENTIL HFA;VENTOLIN HFA) 108 (90 BASE) MCG/ACT inhaler Inhale 2 puffs into the lungs every 6 (six) hours as needed for wheezing or shortness of breath.   Yes Historical Provider, MD  ibuprofen (ADVIL,MOTRIN) 600 MG tablet Take 1 tablet (600 mg total) by mouth every 6 (six) hours as needed. 02/04/14  Yes Merryl Hacker, MD   BP 107/87  Pulse 96  Temp(Src) 99.1 F (37.3 C) (Oral)  Resp 20  Ht 5\' 1"  (1.549 m)  Wt 190 lb (86.183 kg)  BMI 35.92 kg/m2  SpO2 99% Physical Exam  Nursing note and vitals reviewed. Constitutional: She is oriented to person, place, and time. She appears well-developed and well-nourished.  Non-toxic appearance.  HENT:  Head: Normocephalic.  Right Ear: Tympanic membrane and external ear normal.  Left Ear: Tympanic membrane and external ear normal.  Eyes: EOM and lids are normal. Pupils are equal, round, and reactive to light.  Neck: Normal range of motion. Neck supple. Carotid bruit is not present.  Cardiovascular: Normal rate, regular rhythm, normal heart sounds, intact distal pulses and normal pulses.   Pulmonary/Chest: Breath sounds normal. No respiratory distress.  Abdominal: Soft. Bowel sounds are normal. There  is no tenderness. There is no guarding.  Musculoskeletal:       Lumbar back: She exhibits decreased range of motion, tenderness, pain and spasm.  Lymphadenopathy:       Head (right side): No submandibular adenopathy present.       Head (left side): No submandibular adenopathy present.    She has no cervical adenopathy.  Neurological: She is alert and oriented to person, place, and time. She has normal strength. No cranial nerve deficit or sensory deficit.  Gait is steady. No foot drop.  Skin: Skin is warm and dry.  Psychiatric: She has a normal mood and affect. Her  speech is normal.    ED Course  Procedures (including critical care time) Labs Review Labs Reviewed - No data to display  Imaging Review No results found.   EKG Interpretation None      MDM  Gait is intact. No gross neurologic deficits appreciated. Patient has history of degenerative disc disease. The examination is consistent with a muscle strain. Discussed with the patient that the degenerative disc disease may have been aggravated, but mostly the examination revealed a muscle strain. Plan at this time is for the patient to be treated with Tylenol codeine, baclofen, and diclofenac.    Final diagnoses:  Lumbar strain, initial encounter     **I have reviewed nursing notes, vital signs, and all appropriate lab and imaging results for this patient.Lenox Ahr, PA-C 03/16/14 2047

## 2014-03-16 NOTE — Discharge Instructions (Signed)
Please apply a heating pad to your lower back, or soak in a tub of warm water for 15-20 minutes daily until symptoms resolved. You have a muscle strain involving her lower back. Please rest your back is much as possible over the next one to 2 weeks. Baclofen and Tylenol codeine may cause drowsiness, please use these medicines with caution. Please use diclofenac 2 times daily with food. Muscle Strain A muscle strain (pulled muscle) happens when a muscle is stretched beyond normal length. It happens when a sudden, violent force stretches your muscle too far. Usually, a few of the fibers in your muscle are torn. Muscle strain is common in athletes. Recovery usually takes 1-2 weeks. Complete healing takes 5-6 weeks.  HOME CARE   Follow the PRICE method of treatment to help your injury get better. Do this the first 2-3 days after the injury:  Protect. Protect the muscle to keep it from getting injured again.  Rest. Limit your activity and rest the injured body part.  Ice. Put ice in a plastic bag. Place a towel between your skin and the bag. Then, apply the ice and leave it on from 15-20 minutes each hour. After the third day, switch to moist heat packs.  Compression. Use a splint or elastic bandage on the injured area for comfort. Do not put it on too tightly.  Elevate. Keep the injured body part above the level of your heart.  Only take medicine as told by your doctor.  Warm up before doing exercise to prevent future muscle strains. GET HELP IF:   You have more pain or puffiness (swelling) in the injured area.  You feel numbness, tingling, or notice a loss of strength in the injured area. MAKE SURE YOU:   Understand these instructions.  Will watch your condition.  Will get help right away if you are not doing well or get worse. Document Released: 02/19/2008 Document Revised: 03/02/2013 Document Reviewed: 12/09/2012 [facility] Patient Information 2015 [place], [place]. This information is  not intended to replace advice given to you by your health care provider. Make sure you discuss any questions you have with your health care provider.

## 2014-04-15 ENCOUNTER — Emergency Department (HOSPITAL_COMMUNITY)
Admission: EM | Admit: 2014-04-15 | Discharge: 2014-04-15 | Disposition: A | Discharge status: Home / Self Care | Payer: Medicaid Other | Attending: Emergency Medicine | Admitting: Emergency Medicine

## 2014-04-15 ENCOUNTER — Encounter (HOSPITAL_COMMUNITY): Payer: Self-pay | Admitting: Emergency Medicine

## 2014-04-15 ENCOUNTER — Encounter (HOSPITAL_COMMUNITY): Payer: Self-pay

## 2014-04-15 ENCOUNTER — Emergency Department (HOSPITAL_COMMUNITY)
Admission: EM | Admit: 2014-04-15 | Discharge: 2014-04-15 | Disposition: A | Discharge status: Home / Self Care | Payer: Self-pay | Attending: Emergency Medicine | Admitting: Emergency Medicine

## 2014-04-15 ENCOUNTER — Emergency Department (HOSPITAL_COMMUNITY): Payer: Medicaid Other

## 2014-04-15 DIAGNOSIS — R319 Hematuria, unspecified: Secondary | ICD-10-CM

## 2014-04-15 DIAGNOSIS — Z8719 Personal history of other diseases of the digestive system: Secondary | ICD-10-CM | POA: Insufficient documentation

## 2014-04-15 DIAGNOSIS — Z87442 Personal history of urinary calculi: Secondary | ICD-10-CM | POA: Insufficient documentation

## 2014-04-15 DIAGNOSIS — Z9089 Acquired absence of other organs: Secondary | ICD-10-CM | POA: Insufficient documentation

## 2014-04-15 DIAGNOSIS — Z79899 Other long term (current) drug therapy: Secondary | ICD-10-CM | POA: Insufficient documentation

## 2014-04-15 DIAGNOSIS — Z87448 Personal history of other diseases of urinary system: Secondary | ICD-10-CM | POA: Insufficient documentation

## 2014-04-15 DIAGNOSIS — Z8739 Personal history of other diseases of the musculoskeletal system and connective tissue: Secondary | ICD-10-CM | POA: Insufficient documentation

## 2014-04-15 DIAGNOSIS — R109 Unspecified abdominal pain: Secondary | ICD-10-CM

## 2014-04-15 DIAGNOSIS — Z87891 Personal history of nicotine dependence: Secondary | ICD-10-CM | POA: Insufficient documentation

## 2014-04-15 DIAGNOSIS — Z9889 Other specified postprocedural states: Secondary | ICD-10-CM | POA: Insufficient documentation

## 2014-04-15 DIAGNOSIS — M545 Low back pain: Secondary | ICD-10-CM | POA: Insufficient documentation

## 2014-04-15 DIAGNOSIS — Z8679 Personal history of other diseases of the circulatory system: Secondary | ICD-10-CM | POA: Insufficient documentation

## 2014-04-15 DIAGNOSIS — Z791 Long term (current) use of non-steroidal anti-inflammatories (NSAID): Secondary | ICD-10-CM | POA: Insufficient documentation

## 2014-04-15 DIAGNOSIS — Z90710 Acquired absence of both cervix and uterus: Secondary | ICD-10-CM | POA: Insufficient documentation

## 2014-04-15 LAB — COMPREHENSIVE METABOLIC PANEL
ALK PHOS: 69 U/L (ref 39–117)
ALT: 14 U/L (ref 0–35)
AST: 12 U/L (ref 0–37)
Albumin: 4 g/dL (ref 3.5–5.2)
Anion gap: 14 (ref 5–15)
BUN: 12 mg/dL (ref 6–23)
CO2: 25 meq/L (ref 19–32)
Calcium: 9.2 mg/dL (ref 8.4–10.5)
Chloride: 102 mEq/L (ref 96–112)
Creatinine, Ser: 0.74 mg/dL (ref 0.50–1.10)
GFR calc Af Amer: >90 mL/min (ref >90)
Glucose, Bld: 90 mg/dL (ref 70–99)
POTASSIUM: 3.7 meq/L (ref 3.7–5.3)
SODIUM: 141 meq/L (ref 137–147)
Total Bilirubin: <0.2 mg/dL — ABNORMAL LOW (ref 0.3–1.2)
Total Protein: 7.2 g/dL (ref 6.0–8.3)

## 2014-04-15 LAB — URINALYSIS, ROUTINE W REFLEX MICROSCOPIC
Bilirubin Urine: NEGATIVE
Glucose, UA: NEGATIVE mg/dL
Ketones, ur: NEGATIVE mg/dL
LEUKOCYTES UA: NEGATIVE
Nitrite: NEGATIVE
PROTEIN: NEGATIVE mg/dL
SPECIFIC GRAVITY, URINE: 1.02 (ref 1.005–1.030)
UROBILINOGEN UA: 0.2 mg/dL (ref 0.0–1.0)
pH: 6 (ref 5.0–8.0)

## 2014-04-15 LAB — CBC WITH DIFFERENTIAL/PLATELET
BASOS ABS: 0 10*3/uL (ref 0.0–0.1)
Basophils Relative: 0 % (ref 0–1)
Eosinophils Absolute: 0.2 10*3/uL (ref 0.0–0.7)
Eosinophils Relative: 2 % (ref 0–5)
HCT: 39.3 % (ref 36.0–46.0)
Hemoglobin: 13.7 g/dL (ref 12.0–15.0)
LYMPHS ABS: 2.2 10*3/uL (ref 0.7–4.0)
LYMPHS PCT: 34 % (ref 12–46)
MCH: 29.9 pg (ref 26.0–34.0)
MCHC: 34.9 g/dL (ref 30.0–36.0)
MCV: 85.8 fL (ref 78.0–100.0)
Monocytes Absolute: 0.5 10*3/uL (ref 0.1–1.0)
Monocytes Relative: 8 % (ref 3–12)
NEUTROS ABS: 3.6 10*3/uL (ref 1.7–7.7)
NEUTROS PCT: 56 % (ref 43–77)
Platelets: 256 10*3/uL (ref 150–400)
RBC: 4.58 MIL/uL (ref 3.87–5.11)
RDW: 13.5 % (ref 11.5–15.5)
WBC: 6.4 10*3/uL (ref 4.0–10.5)

## 2014-04-15 LAB — URINE MICROSCOPIC-ADD ON

## 2014-04-15 MED ORDER — KETOROLAC TROMETHAMINE 30 MG/ML IJ SOLN
30.0000 mg | Freq: Once | INTRAMUSCULAR | Status: AC
  Administered 2014-04-15: 30 mg via INTRAVENOUS
  Filled 2014-04-15: qty 1

## 2014-04-15 MED ORDER — OXYCODONE-ACETAMINOPHEN 5-325 MG PO TABS
2.0000 | ORAL_TABLET | Freq: Once | ORAL | Status: AC
  Administered 2014-04-15: 2 via ORAL
  Filled 2014-04-15: qty 2

## 2014-04-15 MED ORDER — TRAMADOL HCL 50 MG PO TABS: 50.0000 mg | ORAL_TABLET | Freq: Four times a day (QID) | ORAL | Status: DC | PRN

## 2014-04-15 MED ORDER — OXYCODONE-ACETAMINOPHEN 5-325 MG PO TABS: 1.0000 | ORAL_TABLET | Freq: Four times a day (QID) | ORAL | Status: DC | PRN

## 2014-04-15 NOTE — Discharge Instructions (Signed)
Percocet as prescribed as needed for pain.  You need to obtain a primary Dr.   [place] Pain Flank pain refers to pain that is located on the side of the body between the upper abdomen and the back. The pain may occur over a short period of time (acute) or may be long-term or reoccurring (chronic). It may be mild or severe. Flank pain can be caused by many things. CAUSES  Some of the more common causes of flank pain include:  Muscle strains.   Muscle spasms.   A disease of your spine (vertebral disk disease).   A lung infection (pneumonia).   Fluid around your lungs (pulmonary edema).   A kidney infection.   Kidney stones.   A very painful skin rash caused by the chickenpox virus (shingles).   Gallbladder disease.  [place] care will depend on the cause of your pain. In general,  Rest as directed by your caregiver.  Drink enough fluids to keep your urine clear or pale yellow.  Only take over-the-counter or prescription medicines as directed by your caregiver. Some medicines may help relieve the pain.  Tell your caregiver about any changes in your pain.  Follow up with your caregiver as directed. SEEK IMMEDIATE MEDICAL CARE IF:   Your pain is not controlled with medicine.   You have new or worsening symptoms.  Your pain increases.   You have abdominal pain.   You have shortness of breath.   You have persistent nausea or vomiting.   You have swelling in your abdomen.   You feel faint or pass out.   You have blood in your urine.  You have a fever or persistent symptoms for more than 2-3 days.  You have a fever and your symptoms suddenly get worse. MAKE SURE YOU:   Understand these instructions.  Will watch your condition.  Will get help right away if you are not doing well or get worse. Document Released: 07/03/2005 Document Revised: 02/04/2012 Document Reviewed: 12/25/2011 [facility] Patient Information 2015  [place], [place]. This information is not intended to replace advice given to you by your health care provider. Make sure you discuss any questions you have with your health care provider.    Emergency Department Resource Guide 1) Find a Doctor and Pay Out of Pocket Although you won't have to find out who is covered by your insurance plan, it is a good idea to ask around and get recommendations. You will then need to call the office and see if the doctor you have chosen will accept you as a new patient and what types of options they offer for patients who are self-pay. Some doctors offer discounts or will set up payment plans for their patients who do not have insurance, but you will need to ask so you aren't surprised when you get to your appointment.  2) Contact Your Local Health Department Not all health departments have doctors that can see patients for sick visits, but many do, so it is worth a call to see if yours does. If you don't know where your local health department is, you can check in your phone book. The CDC also has a tool to help you locate your state's health department, and many state websites also have listings of all of their local health departments.  3) Find a [facility] If your illness is not likely to be very severe or complicated, you may want to try a walk in clinic. These are  popping up all over the country in pharmacies, drugstores, and shopping centers. They're usually staffed by nurse practitioners or physician assistants that have been trained to treat common illnesses and complaints. They're usually fairly quick and inexpensive. However, if you have serious medical issues or chronic medical problems, these are probably not your best option.  No Primary Care Doctor: - Call Health Connect at  [phone] - they can help you locate a primary care doctor that  accepts your insurance, provides certain services, etc. - Physician Referral Service- [phone]  Chronic  Pain Problems: Organization         Address  Phone   Notes  [facility]  [phone] Patients need to be referred by their primary care doctor.   Medication Assistance: Organization         Address  Phone   Notes  [facility] Medication [facility] [address] [place], [place] [address] [phone] --Must be a resident of [facility] -- Must have NO insurance coverage whatsoever (no Medicaid/ Medicare, etc.) -- The pt. MUST have a primary care doctor that directs their care regularly and follows them in the community   MedAssist  [phone]   [employer]  [phone]    Agencies that provide inexpensive medical care: Organization         Address  Phone   Notes  [place]  [phone]   [facility]    [phone]   [facility] [place], [place] [address] [phone]   [place] [address] [place] [phone]   Planned Parenthood    [phone]   [facility]    [phone]   [place] and [place] [address], [place] Phone:  [phone], Fax:  [phone] Hours of Operation:  9 am - 6 pm, M-F.  Also accepts Medicaid/Medicare and self-pay.  [facility] for [place] [address], [place] Phone: [phone], Fax: [phone]. Hours of Operation:  8:30 am - 5:30 pm, M-F.  Also accepts Medicaid and self-pay.  [facility] [place] [address] [place] Phone: [phone]   [place], [place], [place] [phone], Ext. [phone] Mondays & Thursdays: 7-9 AM.  First 15 patients are seen on a first come, first serve basis.    [place] Providers:  Organization         Address  Phone   Notes  [facility] [address], [place] [phone] Also  accepts self-pay patients.  [facility] [address], [place]  [phone]   [place], [address], [place] [phone]   [facility] [address] [place] [phone]   Lucianne Lei [address], [place]   [phone] Only accepts [place] Access [place] patients after they have their name applied to their card.   Self-Pay (no insurance) in [facility]:  Organization         Address  Phone   Notes  Sickle Cell Patients, [facility] [place] [phone]   [facility] [place] [phone]   [facility]gent [place]  [place], [place] [phone]   [facility]/Dr. Osei-Bonsu  [address], [place] or [address], [place] [phone] Phone number for both [place] and [place] locations is the same.  [facility] [address], [place] [phone]   [facility] [address] [place] or [address] [phone]   [facility] [address] [place] [phone], phone; [phone], fax Sees patients 1st and 3rd Saturday of every month.  Must not qualify for public or private insurance (i.e. Medicaid, Medicare, [place] Health Choice, Veterans' Benefits)  Household income should be no more than 200% of the poverty level The clinic cannot treat you if you are pregnant or think you are pregnant  Sexually transmitted diseases are not treated at the clinic.    Dental Care: Organization         Address  Phone  Notes  [facility] Department of [facility] [place] [phone] Accepts children up to age 73 who are enrolled in [place] or [place]; pregnant  women with a Medicaid card; and children who have applied for Medicaid or [place] Health Choice, but were declined, whose parents can pay a reduced fee at time of service.  [facility] Department of [facility]  [address], [place] [phone] Accepts children up to age 60 who are enrolled in [place] or [place]; pregnant women with a Medicaid card; and children who have applied for Medicaid or  Health Choice, but were declined, whose parents can pay a reduced fee at time of service.  [place] Adult Dental Access PROGRAM  [place] [phone] Patients are seen by appointment only. Walk-ins are not accepted. [facility] [place] will see patients 50 years of age and older. Monday - Tuesday (8am-5pm) Most Wednesdays (8:30-5pm) $30 per visit, cash only  [facility] Adult Dental Access PROGRAM  [address], [facility] [phone] Patients are seen by appointment only. Walk-ins are not accepted. [place] will see patients 40 years of age and older. One Wednesday Evening (Monthly: Volunteer Based).  $30 per visit, cash only  [place]  [phone] for adults; Children under age 29, call Graduate Pediatric Dentistry at [phone]. Children aged 15-14, please call [phone] to request a pediatric application.  Dental services are provided in all areas of dental care including fillings, crowns and bridges, complete and partial dentures, implants, gum treatment, root canals, and extractions. Preventive care is also provided. Treatment is provided to both adults and children. Patients are selected via a lottery and there is often a waiting list.   [facility] [address] [place]  [phone] [email or web address]   [facility] [address] [place], [place] [phone], Ext. [phone] Second and Fourth Thursday of each month, opens at 6:30 AM; Clinic ends at 9 AM.  Patients are  seen on a first-come first-served basis, and a limited number are seen during each clinic.   [facility]  [address] Hillard Danker [place], [place] [phone]   Eligibility Requirements You must have lived in [place], [place], or [place] counties for at least the last three months.   You cannot be eligible for state or federal sponsored [employer], including [employer], [place],  or Medicare.   You generally cannot be eligible for healthcare insurance through your employer.    How to apply: Eligibility screenings are held every Tuesday and Wednesday afternoon from 1:00 pm until 4:00 pm. You do not need an appointment for the interview!  [facility] [address] [place], [place]   [place]  [place] Department  [place]  [phone]    Behavioral Health Resources in the Community: Intensive Outpatient Programs Organization         Address  Phone  Notes  Marion Morton. [address] [place], [place] [phone]   [facility] Outpatient [address], [place], [place]   ADS: Alcohol & Drug Svcs [address] [place], [place]   [place] [address]  [place], [place] or [phone]   Substance Abuse Resources Organization         Address  Phone  Notes  Alcohol and Drug Services  [phone]   [place]  [phone]   [facility] [place]   Chinita Pester  [phone]   Residential & Outpatient Substance Abuse Program  5794671620   Psychological Services Organization         Address  Phone  Notes  [facility] [place]  [place]  [phone]   [place] [address], [place] or [phone]    Mobile Crisis  Teams Organization         Address  Phone  Notes  [facility], Mobile Crisis Care Unit  [phone]   Assertive Psychotherapeutic Services  [address] [place], [place]   [place] Levels [address] [place] [phone]    Self-Help/Support Groups Organization         Address  Phone             Notes  [place]. of [place] - variety of support groups  [place] Call for more information  Narcotics Anonymous (NA), [facility] [address], [employer] [place]  2 meetings at this location   Special educational needs teacher         Address  Phone  Notes  [facility]AP Residential Treatment [place],    [place]  [phone]   [facility]  [address] [place] [phone], [place], [place]   [place], [place] [phone] Admissions: 8am-3pm M-F  Incentives Substance [place] [address]    [place], [place] [phone]   [facility] [address] [place], [place], [place]   [facility] [address],  [place], [place]   [facility] Programs - Intensive Outpatient [address]een [address], [place], [place]   [facility] [address]  [place], [place] [phone] or [phone]   [facility]idential Treatment Services (RTS) [address], [place], [place] Accepts Medicaid  [facility] [place] [address]  [place] [phone] Substance Abuse/Addiction Treatment   [facility] Organization         Address  Phone  Notes  [facility]  [phone]   Domenic Schwab, PhD [address] [place], [place]   [phone] or [phone]   [facility]arias Pontes  Behavioral   [place], [place] [phone]   [facility] [address] [place], [place] [phone]  Insurance/Medicaid/sponsorship through [facility] and Families [address], [place], [place] [phone] [place], [place] [phone]    Dr. Adele Schilder  [phone]   [facility] [place] Dept. 1) [address] [place] 2) [place] 3)  [place] [address], [place] [phone]  [phone]   [place] [phone] or [phone] (After Hours)

## 2014-04-15 NOTE — ED Notes (Signed)
Persistent low back pain, seen here today for same. No relief with ultram and ibuprofen

## 2014-04-15 NOTE — Discharge Instructions (Signed)
Tramadol as prescribed as needed for pain.  Follow-up with your primary Dr. if not improving in the next week, and return to the ER if you develop any new and concerning symptoms.   Flank Pain Flank pain refers to pain that is located on the side of the body between the upper abdomen and the back. The pain may occur over a short period of time (acute) or may be long-term or reoccurring (chronic). It may be mild or severe. Flank pain can be caused by many things. CAUSES  Some of the more common causes of flank pain include:  Muscle strains.   Muscle spasms.   A disease of your spine (vertebral disk disease).   A lung infection (pneumonia).   Fluid around your lungs (pulmonary edema).   A kidney infection.   Kidney stones.   A very painful skin rash caused by the chickenpox virus (shingles).   Gallbladder disease.  [place] care will depend on the cause of your pain. In general,  Rest as directed by your caregiver.  Drink enough fluids to keep your urine clear or pale yellow.  Only take over-the-counter or prescription medicines as directed by your caregiver. Some medicines may help relieve the pain.  Tell your caregiver about any changes in your pain.  Follow up with your caregiver as directed. SEEK IMMEDIATE MEDICAL CARE IF:   Your pain is not controlled with medicine.   You have new or worsening symptoms.  Your pain increases.   You have abdominal pain.   You have shortness of breath.   You have persistent nausea or vomiting.   You have swelling in your abdomen.   You feel faint or pass out.   You have blood in your urine.  You have a fever or persistent symptoms for more than 2-3 days.  You have a fever and your symptoms suddenly get worse. MAKE SURE YOU:   Understand these instructions.  Will watch your condition.  Will get help right away if you are not doing well or get worse. Document Released: 07/03/2005  Document Revised: 02/04/2012 Document Reviewed: 12/25/2011 [facility] Patient Information 2015 [place], [place]. This information is not intended to replace advice given to you by your health care provider. Make sure you discuss any questions you have with your health care provider.

## 2014-04-15 NOTE — ED Provider Notes (Signed)
CSN: [id number]     Arrival date & time 04/15/14  2054 History   First MD Initiated Contact with Patient 04/15/14 2103     Chief Complaint  Patient presents with  . Back Pain     (Consider location/radiation/quality/duration/timing/severity/associated sxs/prior Treatment) HPI Comments: Patient is a 38 year old female with history of kidney stones. She presents for the second time today with complaints of right flank pain and noticing blood in her urine. She was seen by me earlier this afternoon and underwent a CT scan which revealed no evidence for renal calculus or other intra-abdominal pathology. There was blood in the urine, however no evidence for infection. She was discharged with a prescription for tramadol but returns this evening stating this is not helping her pain. She tells me that when she has severe pain Percocet is usually what helps.  Patient is a 38 y.o. female presenting with flank pain. The history is provided by the patient.  Flank Pain This is a new problem. The current episode started 2 days ago. The problem occurs constantly. The problem has been rapidly worsening. Pertinent negatives include no abdominal pain. Nothing aggravates the symptoms. Nothing relieves the symptoms. She has tried nothing for the symptoms. The treatment provided no relief.    Past Medical History  Diagnosis Date  . Celiac disease   . Interstitial cystitis   . Arrhythmia   . DDD (degenerative disc disease)   . Kidney stones   . Kidney stones   . Kidney stone    Past Surgical History  Procedure Laterality Date  . Cesarean section    . Tonsillectomy    . Appendectomy    . Abdominal hysterectomy    . Hernia repair    . Adenoidectomy    . Kidney stone surgery     No family history on file. History  Substance Use Topics  . Smoking status: Former Smoker -- 0.50 packs/day    Types: Cigarettes  . Smokeless tobacco: Not on file  . Alcohol Use: No   OB History    No data available      Review of Systems  Gastrointestinal: Negative for abdominal pain.  Genitourinary: Positive for flank pain.  All other systems reviewed and are negative.     Allergies  Gluten  Home Medications   Prior to Admission medications   Medication Sig Start Date End Date Taking? Authorizing Provider  acetaminophen-codeine (TYLENOL #3) 300-30 MG per tablet Take 1-2 tablets by mouth every 6 (six) hours as needed. Patient not taking: Reported on 04/15/2014 03/16/14   Lenox Ahr, PA-C  albuterol (PROVENTIL HFA;VENTOLIN HFA) 108 (90 BASE) MCG/ACT inhaler Inhale 2 puffs into the lungs every 6 (six) hours as needed for wheezing or shortness of breath.    Historical Provider, MD  baclofen (LIORESAL) 10 MG tablet Take 1 tablet (10 mg total) by mouth 3 (three) times daily. Patient not taking: Reported on 04/15/2014 03/16/14 04/15/14  Lenox Ahr, PA-C  diclofenac (VOLTAREN) 75 MG EC tablet Take 1 tablet (75 mg total) by mouth 2 (two) times daily. Patient not taking: Reported on 04/15/2014 03/16/14   Lenox Ahr, PA-C  ibuprofen (ADVIL,MOTRIN) 200 MG tablet Take 600 mg by mouth every 6 (six) hours as needed for mild pain.    Historical Provider, MD  ibuprofen (ADVIL,MOTRIN) 600 MG tablet Take 1 tablet (600 mg total) by mouth every 6 (six) hours as needed. Patient not taking: Reported on 04/15/2014 02/04/14   Merryl Hacker, MD  traMADol (ULTRAM) 50 MG tablet Take 1 tablet (50 mg total) by mouth every 6 (six) hours as needed. 04/15/14   Veryl Speak, MD   BP 120/64 mmHg  Pulse 100  Temp(Src) 97.7 F (36.5 C) (Oral)  Resp 20  Ht 5\' 1"  (1.549 m)  Wt 190 lb (86.183 kg)  BMI 35.92 kg/m2  SpO2 100% Physical Exam  Constitutional: She is oriented to person, place, and time. She appears well-developed and well-nourished. No distress.  HENT:  Head: Normocephalic and atraumatic.  Neck: Normal range of motion. Neck supple.  Cardiovascular: Normal rate and regular rhythm.  Exam reveals no  gallop and no friction rub.   No murmur heard. Pulmonary/Chest: Effort normal and breath sounds normal. No respiratory distress. She has no wheezes.  Abdominal: Soft. Bowel sounds are normal. She exhibits no distension. There is no tenderness.  There is tenderness to palpation in the right flank and right midabdomen. There is no rebound and no guarding.  Musculoskeletal: Normal range of motion.  Neurological: She is alert and oriented to person, place, and time.  Skin: Skin is warm and dry. She is not diaphoretic.  Nursing note and vitals reviewed.   ED Course  Procedures (including critical care time) Labs Review Labs Reviewed - No data to display  Imaging Review Ct Renal Stone Study  04/15/2014   CLINICAL DATA:  Right-sided flank pain for 3 days. History of kidney stones. History of celiac disease. Previous appendectomy and hysterectomy as well as hernia repair.  EXAM: CT ABDOMEN AND PELVIS WITHOUT CONTRAST  TECHNIQUE: Multidetector CT imaging of the abdomen and pelvis was performed following the standard protocol without IV contrast.  COMPARISON:  10/17/2011  FINDINGS: Lung bases are within normal.  Abdominal images demonstrate a normal liver, spleen, pancreas, gallbladder and adrenal glands. Kidneys are normal in size without hydronephrosis or nephrolithiasis. There is no perinephric inflammation or fluid. The ureters are within normal. There is no free fluid or focal inflammatory change. Vascular structures are within normal. Small bowel and colon as well as the mesentery are within normal.  Pelvic images demonstrate surgical absence of the uterus. There is no free fluid or adenopathy. The bladder and rectosigmoid colon are unremarkable. There are mild degenerative changes of the spine with disc disease at the L4-5 level.  IMPRESSION: No acute findings in the abdomen/pelvis. No renal/ureteral stones or obstruction.  Postsurgical changes as described.  Mild spondylosis of the spine with disc  disease at the L4-5 level.   Electronically Signed   By: Marin Olp M.D.   On: 04/15/2014 15:49     EKG Interpretation None      MDM   Final diagnoses:  None    The patient presents here with complaints of pain in her right side that is unrelieved with tramadol. Prior workup this afternoon revealed no kidney stones or other intra-abdominal pathology. Her physical examination remains unremarkable. As she has no relief with tramadol, I will prescribe her a limited quantity of the Percocet which she is requesting. She will be discharged to home and I feel as though no further workup is indicated. I've advised the patient that she needs to follow-up with a primary Dr. since she is new to the area and further prescriptions for Percocet will not be provided in the absence of any objective pathology.    Veryl Speak, MD 04/15/14 2112

## 2014-04-15 NOTE — ED Notes (Signed)
Patient given discharge instruction, verbalized understand. Patient ambulatory out of the department.  

## 2014-04-15 NOTE — ED Provider Notes (Signed)
CSN: [id number]     Arrival date & time 04/15/14  1439 History  This chart was scribed for Veryl Speak, MD by Dellis Filbert, ED Scribe. The patient was seen in [place] and the patient's care was started at 2:55 PM.  Chief Complaint  Patient presents with  . Flank Pain   Patient is a 38 y.o. female presenting with flank pain. The history is provided by the patient. No language interpreter was used.  Flank Pain This is a chronic problem. The current episode started more than 2 days ago. The problem occurs constantly. The problem has been gradually worsening. Associated symptoms include abdominal pain. Nothing aggravates the symptoms. Nothing relieves the symptoms. She has tried nothing for the symptoms. The treatment provided no relief.   HPI Comments: Samantha [name] is a 38 y.o. female with a history of DDD and kidney stones who presents to the Emergency Department complaining of lower back pain and flank pain which began 3 days ago. She has dysuria, fever, hematuria as associated symptoms. Pt states she thought she had UTI so she took medication for it which provided no relief. She has had sick contacts at home. Pt has had hysterectomy 10 years ago  Past Medical History  Diagnosis Date  . Celiac disease   . Interstitial cystitis   . Arrhythmia   . DDD (degenerative disc disease)   . Kidney stones   . Kidney stones   . Kidney stone    Past Surgical History  Procedure Laterality Date  . Cesarean section    . Tonsillectomy    . Appendectomy    . Abdominal hysterectomy    . Hernia repair    . Adenoidectomy    . Kidney stone surgery     History reviewed. No pertinent family history. History  Substance Use Topics  . Smoking status: Former Smoker -- 0.50 packs/day    Types: Cigarettes  . Smokeless tobacco: Not on file  . Alcohol Use: No   OB History    No data available     Review of Systems  Gastrointestinal: Positive for abdominal pain.  Genitourinary: Positive for  flank pain.  Musculoskeletal: Positive for back pain.  All other systems reviewed and are negative.  Allergies  Gluten  Home Medications   Prior to Admission medications   Medication Sig Start Date End Date Taking? Authorizing Provider  acetaminophen (TYLENOL) 500 MG tablet Take 500 mg by mouth every 6 (six) hours as needed for mild pain, moderate pain or headache.    Historical Provider, MD  acetaminophen-codeine (TYLENOL #3) 300-30 MG per tablet Take 1-2 tablets by mouth every 6 (six) hours as needed. 03/16/14   Lenox Ahr, PA-C  albuterol (PROVENTIL HFA;VENTOLIN HFA) 108 (90 BASE) MCG/ACT inhaler Inhale 2 puffs into the lungs every 6 (six) hours as needed for wheezing or shortness of breath.    Historical Provider, MD  baclofen (LIORESAL) 10 MG tablet Take 1 tablet (10 mg total) by mouth 3 (three) times daily. 03/16/14 04/15/14  Lenox Ahr, PA-C  diclofenac (VOLTAREN) 75 MG EC tablet Take 1 tablet (75 mg total) by mouth 2 (two) times daily. 03/16/14   Lenox Ahr, PA-C  ibuprofen (ADVIL,MOTRIN) 600 MG tablet Take 1 tablet (600 mg total) by mouth every 6 (six) hours as needed. 02/04/14   Merryl Hacker, MD   BP 113/66 mmHg  Pulse 93  Temp(Src) 97.8 F (36.6 C) (Oral)  Resp 16  Ht 5\' 1"  (1.549 m)  Wt 190 lb (86.183 kg)  BMI 35.92 kg/m2  SpO2 99% Physical Exam  Constitutional: She is oriented to person, place, and time. She appears well-developed and well-nourished. No distress.  HENT:  Head: Normocephalic and atraumatic.  Mouth/Throat: Oropharynx is clear and moist.  Eyes: Conjunctivae and EOM are normal. Pupils are equal, round, and reactive to light.  Neck: Normal range of motion. Neck supple.  Cardiovascular: Normal rate, regular rhythm and normal heart sounds.   No murmur heard. Pulmonary/Chest: Effort normal. No respiratory distress. She has no wheezes. She has no rales.  Abdominal: There is no rebound and no guarding.  There is TTP in the right flank and  right mid abdomen.  Neurological: She is alert and oriented to person, place, and time. She has normal reflexes. No cranial nerve deficit. She exhibits normal muscle tone. Coordination normal.  Skin: Skin is warm and dry.  Psychiatric: She has a normal mood and affect. Her behavior is normal.  Nursing note and vitals reviewed.   ED Course  Procedures  DIAGNOSTIC STUDIES: Oxygen Saturation is 99% on room air, normal by my interpretation.    COORDINATION OF CARE: 3:00 PM Discussed treatment plan with pt at bedside and pt agreed to plan.   Labs Review Labs Reviewed  URINALYSIS, ROUTINE W REFLEX MICROSCOPIC  CBC WITH DIFFERENTIAL  COMPREHENSIVE METABOLIC PANEL    Imaging Review No results found.   EKG Interpretation None      MDM   Final diagnoses:  None   CT scan is negative for renal calculus, and urinalysis is negative for infection. I am uncertain as the exact etiology of her symptoms, however nothing appears emergent. There does not appear to be any obstructive uropathy or urinary tract infection. I believe she is appropriate for discharge. I will treat with tramadol and when necessary return.   I personally performed the services described in this documentation, which was scribed in my presence. The recorded information has been reviewed and is accurate.        Veryl Speak, MD 04/15/14 1900

## 2014-04-15 NOTE — ED Notes (Signed)
Pt reports right flank pain x3 days. Pt reports "moderate amount of blood in urine". Pt reports RLQ abdominal pain, last emesis x2 days ago.

## 2014-04-30 ENCOUNTER — Emergency Department (HOSPITAL_COMMUNITY)
Admission: EM | Admit: 2014-04-30 | Discharge: 2014-04-30 | Disposition: A | Discharge status: Home / Self Care | Payer: Medicaid Other | Attending: Emergency Medicine | Admitting: Emergency Medicine

## 2014-04-30 ENCOUNTER — Encounter (HOSPITAL_COMMUNITY): Payer: Self-pay | Admitting: Emergency Medicine

## 2014-04-30 DIAGNOSIS — Z87448 Personal history of other diseases of urinary system: Secondary | ICD-10-CM | POA: Insufficient documentation

## 2014-04-30 DIAGNOSIS — Z8739 Personal history of other diseases of the musculoskeletal system and connective tissue: Secondary | ICD-10-CM | POA: Insufficient documentation

## 2014-04-30 DIAGNOSIS — Z8719 Personal history of other diseases of the digestive system: Secondary | ICD-10-CM | POA: Insufficient documentation

## 2014-04-30 DIAGNOSIS — Z79899 Other long term (current) drug therapy: Secondary | ICD-10-CM | POA: Insufficient documentation

## 2014-04-30 DIAGNOSIS — Z87891 Personal history of nicotine dependence: Secondary | ICD-10-CM | POA: Insufficient documentation

## 2014-04-30 DIAGNOSIS — R319 Hematuria, unspecified: Secondary | ICD-10-CM | POA: Insufficient documentation

## 2014-04-30 DIAGNOSIS — Z87442 Personal history of urinary calculi: Secondary | ICD-10-CM | POA: Insufficient documentation

## 2014-04-30 DIAGNOSIS — Z9889 Other specified postprocedural states: Secondary | ICD-10-CM | POA: Insufficient documentation

## 2014-04-30 LAB — URINALYSIS, ROUTINE W REFLEX MICROSCOPIC
Bilirubin Urine: NEGATIVE
GLUCOSE, UA: NEGATIVE mg/dL
LEUKOCYTES UA: NEGATIVE
Nitrite: NEGATIVE
PH: 6 (ref 5.0–8.0)
Protein, ur: NEGATIVE mg/dL
Specific Gravity, Urine: 1.02 (ref 1.005–1.030)
Urobilinogen, UA: 0.2 mg/dL (ref 0.0–1.0)

## 2014-04-30 LAB — URINE MICROSCOPIC-ADD ON

## 2014-04-30 MED ORDER — PROMETHAZINE HCL 25 MG PO TABS: 25.0000 mg | ORAL_TABLET | Freq: Four times a day (QID) | ORAL | Status: DC | PRN

## 2014-04-30 MED ORDER — KETOROLAC TROMETHAMINE 60 MG/2ML IM SOLN
60.0000 mg | Freq: Once | INTRAMUSCULAR | Status: AC
  Administered 2014-04-30: 60 mg via INTRAMUSCULAR
  Filled 2014-04-30: qty 2

## 2014-04-30 MED ORDER — OXYCODONE-ACETAMINOPHEN 5-325 MG PO TABS: 1.0000 | ORAL_TABLET | ORAL | Status: DC | PRN

## 2014-04-30 NOTE — Discharge Instructions (Signed)
Hematuria Hematuria is blood in your urine. It can be caused by a bladder infection, kidney infection, prostate infection, kidney stone, or cancer of your urinary tract. Infections can usually be treated with medicine, and a kidney stone usually will pass through your urine. If neither of these is the cause of your hematuria, further workup to find out the reason may be needed. It is very important that you tell your health care provider about any blood you see in your urine, even if the blood stops without treatment or happens without causing pain. Blood in your urine that happens and then stops and then happens again can be a symptom of a very serious condition. Also, pain is not a symptom in the initial stages of many urinary cancers. HOME CARE INSTRUCTIONS   Drink lots of fluid, 3-4 quarts a day. If you have been diagnosed with an infection, cranberry juice is especially recommended, in addition to large amounts of water.  Avoid caffeine, tea, and carbonated beverages because they tend to irritate the bladder.  Avoid alcohol because it may irritate the prostate.  Take all medicines as directed by your health care provider.  If you were prescribed an antibiotic medicine, finish it all even if you start to feel better.  If you have been diagnosed with a kidney stone, follow your health care provider's instructions regarding straining your urine to catch the stone.  Empty your bladder often. Avoid holding urine for long periods of time.  After a bowel movement, women should cleanse front to back. Use each tissue only once.  Empty your bladder before and after sexual intercourse if you are a female. SEEK MEDICAL CARE IF:  You develop back pain.  You have a fever.  You have a feeling of sickness in your stomach (nausea) or vomiting.  Your symptoms are not better in 3 days. Return sooner if you are getting worse. SEEK IMMEDIATE MEDICAL CARE IF:   You develop severe vomiting and are  unable to keep the medicine down.  You develop severe back or abdominal pain despite taking your medicines.  You begin passing a large amount of blood or clots in your urine.  You feel extremely weak or faint, or you pass out. MAKE SURE YOU:   Understand these instructions.  Will watch your condition.  Will get help right away if you are not doing well or get worse. Document Released: 05/12/2005 Document Revised: 09/26/2013 Document Reviewed: 01/10/2013 [facility] Patient Information 2015 [place], [place]. This information is not intended to replace advice given to you by your health care provider. Make sure you discuss any questions you have with your health care provider.  You will need to see a urologist. Phone number given. Prescription for pain and nausea medication.

## 2014-04-30 NOTE — ED Notes (Signed)
Pt reports seen for same last week. Pt reports continued blood in urine and lower back pain. Pt reports n/v.

## 2014-04-30 NOTE — ED Provider Notes (Addendum)
CSN: [id number]     Arrival date & time 04/30/14  1403 History   First MD Initiated Contact with Patient 04/30/14 1504     Chief Complaint  Patient presents with  . Hematuria     (Consider location/radiation/quality/duration/timing/severity/associated sxs/prior Treatment) HPI.... Right flank pain with hematuria for 24 hours. Patient has required a ureteral stent in the past by local urologist, ? Left sided.  No fever, chills, dysuria. Patient's been to the emergency department for the third time for this problem recently. She is able to eat. Severity is moderate.  Past Medical History  Diagnosis Date  . Celiac disease   . Interstitial cystitis   . Arrhythmia   . DDD (degenerative disc disease)   . Kidney stones   . Kidney stones   . Kidney stone    Past Surgical History  Procedure Laterality Date  . Cesarean section    . Tonsillectomy    . Appendectomy    . Abdominal hysterectomy    . Hernia repair    . Adenoidectomy    . Kidney stone surgery     History reviewed. No pertinent family history. History  Substance Use Topics  . Smoking status: Former Smoker -- 0.50 packs/day    Types: Cigarettes  . Smokeless tobacco: Not on file  . Alcohol Use: No   OB History    No data available     Review of Systems    Allergies  Gluten  Home Medications   Prior to Admission medications   Medication Sig Start Date End Date Taking? Authorizing Provider  ibuprofen (ADVIL,MOTRIN) 200 MG tablet Take 800 mg by mouth every 6 (six) hours as needed for mild pain.    Yes Historical Provider, MD  acetaminophen-codeine (TYLENOL #3) 300-30 MG per tablet Take 1-2 tablets by mouth every 6 (six) hours as needed. Patient not taking: Reported on 04/15/2014 03/16/14   Lenox Ahr, PA-C  albuterol (PROVENTIL HFA;VENTOLIN HFA) 108 (90 BASE) MCG/ACT inhaler Inhale 2 puffs into the lungs every 6 (six) hours as needed for wheezing or shortness of breath.    Historical Provider, MD  diclofenac  (VOLTAREN) 75 MG EC tablet Take 1 tablet (75 mg total) by mouth 2 (two) times daily. Patient not taking: Reported on 04/15/2014 03/16/14   Lenox Ahr, PA-C  ibuprofen (ADVIL,MOTRIN) 600 MG tablet Take 1 tablet (600 mg total) by mouth every 6 (six) hours as needed. Patient not taking: Reported on 04/15/2014 02/04/14   Merryl Hacker, MD  oxyCODONE-acetaminophen (PERCOCET) 5-325 MG per tablet Take 1-2 tablets by mouth every 4 (four) hours as needed. 04/30/14   Nat Christen, MD  promethazine (PHENERGAN) 25 MG tablet Take 1 tablet (25 mg total) by mouth every 6 (six) hours as needed. 04/30/14   Nat Christen, MD  traMADol (ULTRAM) 50 MG tablet Take 1 tablet (50 mg total) by mouth every 6 (six) hours as needed. Patient not taking: Reported on 04/30/2014 04/15/14   Veryl Speak, MD   BP 108/79 mmHg  Pulse 89  Temp(Src) 98.4 F (36.9 C) (Oral)  Resp 14  Ht 5\' 1"  (1.549 m)  Wt 190 lb (86.183 kg)  BMI 35.92 kg/m2  SpO2 98% Physical Exam  Constitutional: She is oriented to person, place, and time. She appears well-developed and well-nourished.  HENT:  Head: Normocephalic and atraumatic.  Eyes: Conjunctivae and EOM are normal. Pupils are equal, round, and reactive to light.  Neck: Normal range of motion. Neck supple.  Cardiovascular: Normal rate and  regular rhythm.   Pulmonary/Chest: Effort normal and breath sounds normal.  Abdominal: Soft. Bowel sounds are normal.  Genitourinary:  Minimal right flank tenderness  Musculoskeletal: Normal range of motion.  Neurological: She is alert and oriented to person, place, and time.  Skin: Skin is warm and dry.  Psychiatric: She has a normal mood and affect. Her behavior is normal.  Nursing note and vitals reviewed.   ED Course  Procedures (including critical care time) Labs Review Labs Reviewed  URINALYSIS, ROUTINE W REFLEX MICROSCOPIC - Abnormal; Notable for the following:    APPearance HAZY (*)    Hgb urine dipstick LARGE (*)    Ketones, ur  TRACE (*)    All other components within normal limits  URINE MICROSCOPIC-ADD ON - Abnormal; Notable for the following:    Squamous Epithelial / LPF FEW (*)    Bacteria, UA FEW (*)    All other components within normal limits    Imaging Review No results found.   EKG Interpretation None      MDM   Final diagnoses:  Hematuria    No acute abdomen. Patient has hematuria, no evidence of urinary tract infection. She has had multiple CT scans in the past. Will treat her pain and refer to urology. Discharge medications Percocet and Phenergan 25 mg    Nat Christen, MD 05/03/14 3614  Nat Christen, MD 05/17/14 1426

## 2014-04-30 NOTE — ED Notes (Signed)
Called for room, pt in bathroom.

## 2014-05-20 ENCOUNTER — Emergency Department (HOSPITAL_COMMUNITY)
Admission: EM | Admit: 2014-05-20 | Discharge: 2014-05-20 | Disposition: A | Discharge status: Home / Self Care | Payer: Medicaid Other | Attending: Emergency Medicine | Admitting: Emergency Medicine

## 2014-05-20 ENCOUNTER — Emergency Department (HOSPITAL_COMMUNITY): Payer: Medicaid Other

## 2014-05-20 ENCOUNTER — Encounter (HOSPITAL_COMMUNITY): Payer: Self-pay | Admitting: Emergency Medicine

## 2014-05-20 DIAGNOSIS — Y9389 Activity, other specified: Secondary | ICD-10-CM | POA: Insufficient documentation

## 2014-05-20 DIAGNOSIS — Z8719 Personal history of other diseases of the digestive system: Secondary | ICD-10-CM | POA: Insufficient documentation

## 2014-05-20 DIAGNOSIS — Z79899 Other long term (current) drug therapy: Secondary | ICD-10-CM | POA: Insufficient documentation

## 2014-05-20 DIAGNOSIS — S93602A Unspecified sprain of left foot, initial encounter: Secondary | ICD-10-CM

## 2014-05-20 DIAGNOSIS — X58XXXA Exposure to other specified factors, initial encounter: Secondary | ICD-10-CM | POA: Insufficient documentation

## 2014-05-20 DIAGNOSIS — Y92096 Garden or yard of other non-institutional residence as the place of occurrence of the external cause: Secondary | ICD-10-CM | POA: Insufficient documentation

## 2014-05-20 DIAGNOSIS — Z87442 Personal history of urinary calculi: Secondary | ICD-10-CM | POA: Insufficient documentation

## 2014-05-20 DIAGNOSIS — T1490XA Injury, unspecified, initial encounter: Secondary | ICD-10-CM

## 2014-05-20 DIAGNOSIS — Z8679 Personal history of other diseases of the circulatory system: Secondary | ICD-10-CM | POA: Insufficient documentation

## 2014-05-20 DIAGNOSIS — Z791 Long term (current) use of non-steroidal anti-inflammatories (NSAID): Secondary | ICD-10-CM | POA: Insufficient documentation

## 2014-05-20 DIAGNOSIS — Z87891 Personal history of nicotine dependence: Secondary | ICD-10-CM | POA: Insufficient documentation

## 2014-05-20 DIAGNOSIS — Z87448 Personal history of other diseases of urinary system: Secondary | ICD-10-CM | POA: Insufficient documentation

## 2014-05-20 DIAGNOSIS — Y998 Other external cause status: Secondary | ICD-10-CM | POA: Insufficient documentation

## 2014-05-20 DIAGNOSIS — S93692A Other sprain of left foot, initial encounter: Secondary | ICD-10-CM | POA: Insufficient documentation

## 2014-05-20 DIAGNOSIS — Z8739 Personal history of other diseases of the musculoskeletal system and connective tissue: Secondary | ICD-10-CM | POA: Insufficient documentation

## 2014-05-20 HISTORY — DX: Other intervertebral disc degeneration, lumbar region: M51.36

## 2014-05-20 HISTORY — DX: Other intervertebral disc degeneration, lumbar region without mention of lumbar back pain or lower extremity pain: M51.369

## 2014-05-20 MED ORDER — TRAMADOL HCL 50 MG PO TABS
50.0000 mg | ORAL_TABLET | Freq: Once | ORAL | Status: AC
  Administered 2014-05-20: 50 mg via ORAL
  Filled 2014-05-20: qty 1

## 2014-05-20 MED ORDER — TRAMADOL HCL 50 MG PO TABS: 50.0000 mg | ORAL_TABLET | Freq: Four times a day (QID) | ORAL | Status: DC | PRN

## 2014-05-20 NOTE — ED Notes (Addendum)
CMS intact on L foot.  No swelling of medial or lateral malleolus.  Patient has taken 400mg  ibuprofen and iced ankle/foot.

## 2014-05-20 NOTE — Discharge Instructions (Signed)
Foot Sprain The muscles and cord like structures which attach muscle to bone (tendons) that surround the feet are made up of units. A foot sprain can occur at the weakest spot in any of these units. This condition is most often caused by injury to or overuse of the foot, as from playing contact sports, or aggravating a previous injury, or from poor conditioning, or obesity. SYMPTOMS  Pain with movement of the foot.  Tenderness and swelling at the injury site.  Loss of strength is present in moderate or severe sprains. THE THREE GRADES OR SEVERITY OF FOOT SPRAIN ARE:  Mild (Grade I): Slightly pulled muscle without tearing of muscle or tendon fibers or loss of strength.  Moderate (Grade II): Tearing of fibers in a muscle, tendon, or at the attachment to bone, with small decrease in strength.  Severe (Grade III): Rupture of the muscle-tendon-bone attachment, with separation of fibers. Severe sprain requires surgical repair. Often repeating (chronic) sprains are caused by overuse. Sudden (acute) sprains are caused by direct injury or over-use. DIAGNOSIS  Diagnosis of this condition is usually by your own observation. If problems continue, a caregiver may be required for further evaluation and treatment. X-rays may be required to make sure there are not breaks in the bones (fractures) present. Continued problems may require physical therapy for treatment. PREVENTION  Use strength and conditioning exercises appropriate for your sport.  Warm up properly prior to working out.  Use athletic shoes that are made for the sport you are participating in.  Allow adequate time for healing. Early return to activities makes repeat injury more likely, and can lead to an unstable arthritic foot that can result in prolonged disability. Mild sprains generally heal in 3 to 10 days, with moderate and severe sprains taking 2 to 10 weeks. Your caregiver can help you determine the proper time required for  healing. HOME CARE INSTRUCTIONS   Apply ice to the injury for 15-20 minutes, 03-04 times per day. Put the ice in a plastic bag and place a towel between the bag of ice and your skin.  An elastic wrap (like an Ace bandage) may be used to keep swelling down.  Keep foot above the level of the heart, or at least raised on a footstool, when swelling and pain are present.  Try to avoid use other than gentle range of motion while the foot is painful. Do not resume use until instructed by your caregiver. Then begin use gradually, not increasing use to the point of pain. If pain does develop, decrease use and continue the above measures, gradually increasing activities that do not cause discomfort, until you gradually achieve normal use.  Use crutches if and as instructed, and for the length of time instructed.  Keep injured foot and ankle wrapped between treatments.  Massage foot and ankle for comfort and to keep swelling down. Massage from the toes up towards the knee.  Only take over-the-counter or prescription medicines for pain, discomfort, or fever as directed by your caregiver. SEEK IMMEDIATE MEDICAL CARE IF:   Your pain and swelling increase, or pain is not controlled with medications.  You have loss of feeling in your foot or your foot turns cold or blue.  You develop new, unexplained symptoms, or an increase of the symptoms that brought you to your caregiver. MAKE SURE YOU:   Understand these instructions.  Will watch your condition.  Will get help right away if you are not doing well or get worse. Document Released:  11/01/2001 Document Revised: 08/04/2011 Document Reviewed: 12/30/2007 [employer] Patient Information 2015 [place], [place]. This information is not intended to replace advice given to you by your health care provider. Make sure you discuss any questions you have with your health care provider.   Wear the ASO and use crutches to avoid weight bearing.  Use ice and  elevation as much as possible for the next several days to help reduce the swelling.  Take the medications prescribed.  You may take the tramadol prescribed for pain relief.  This will make you drowsy - do not drive within 4 hours of taking this medication.    Call your doctor for a recheck if your pain is not improving over the next week.

## 2014-05-20 NOTE — ED Notes (Signed)
Pt states she was playing outside and fell into a hole dug by her dog. Pt reports she injured her left ankle.

## 2014-05-20 NOTE — ED Notes (Signed)
Patient with no complaints at this time. Respirations even and unlabored. Skin warm/dry. Discharge instructions reviewed with patient at this time. Patient given opportunity to voice concerns/ask questions. Patient discharged at this time and left Emergency Department with steady gait.   

## 2014-05-22 NOTE — ED Provider Notes (Signed)
CSN: [id number]     Arrival date & time 05/20/14  1747 History   First MD Initiated Contact with Patient 05/20/14 1857     Chief Complaint  Patient presents with  . Fall     (Consider location/radiation/quality/duration/timing/severity/associated sxs/prior Treatment) The history is provided by the patient.   Samantha [name] is a 38 y.o. female presenting with left foot and ankle pain which occurred suddenly when the patient stepped in a hole in her yard dug by the dog.  The injury occurred this morning.  She is unsure if she inverted or hyperextended/flexed the extremity as it happened so fast.   Pain is aching, constant and worse with palpation, movement and weight bearing.  There is no knee pain. The patient was able to weight bear immediately after the event.  There is no radiation of pain and the patient denies numbness distal to the injury site.  The patients treatment prior to arrival included rest and elevation, ibuprofen.     Past Medical History  Diagnosis Date  . Celiac disease   . Interstitial cystitis   . Arrhythmia   . DDD (degenerative disc disease)   . Kidney stones   . Kidney stones   . Kidney stone   . Degenerative disc disease, lumbar    Past Surgical History  Procedure Laterality Date  . Cesarean section    . Tonsillectomy    . Appendectomy    . Abdominal hysterectomy    . Hernia repair    . Adenoidectomy    . Kidney stone surgery     No family history on file. History  Substance Use Topics  . Smoking status: Former Smoker -- 0.50 packs/day    Types: Cigarettes  . Smokeless tobacco: Not on file  . Alcohol Use: No   OB History    No data available     Review of Systems  Musculoskeletal: Positive for joint swelling and arthralgias.  Skin: Negative for wound.  Neurological: Negative for weakness and numbness.      Allergies  Gluten  Home Medications   Prior to Admission medications   Medication Sig Start Date End Date Taking? Authorizing  Provider  ibuprofen (ADVIL,MOTRIN) 200 MG tablet Take 800 mg by mouth every 6 (six) hours as needed for mild pain.    Yes Historical Provider, MD  acetaminophen-codeine (TYLENOL #3) 300-30 MG per tablet Take 1-2 tablets by mouth every 6 (six) hours as needed. Patient not taking: Reported on 04/15/2014 03/16/14   Lenox Ahr, PA-C  albuterol (PROVENTIL HFA;VENTOLIN HFA) 108 (90 BASE) MCG/ACT inhaler Inhale 2 puffs into the lungs every 6 (six) hours as needed for wheezing or shortness of breath.    Historical Provider, MD  diclofenac (VOLTAREN) 75 MG EC tablet Take 1 tablet (75 mg total) by mouth 2 (two) times daily. Patient not taking: Reported on 04/15/2014 03/16/14   Lenox Ahr, PA-C  ibuprofen (ADVIL,MOTRIN) 600 MG tablet Take 1 tablet (600 mg total) by mouth every 6 (six) hours as needed. Patient not taking: Reported on 04/15/2014 02/04/14   Merryl Hacker, MD  oxyCODONE-acetaminophen (PERCOCET) 5-325 MG per tablet Take 1-2 tablets by mouth every 4 (four) hours as needed. Patient not taking: Reported on 05/20/2014 04/30/14   Nat Christen, MD  promethazine (PHENERGAN) 25 MG tablet Take 1 tablet (25 mg total) by mouth every 6 (six) hours as needed. Patient not taking: Reported on 05/20/2014 04/30/14   Nat Christen, MD  traMADol (ULTRAM) 50 MG tablet  Take 1 tablet (50 mg total) by mouth every 6 (six) hours as needed. 05/20/14   Evalee Jefferson, PA-C   BP 106/65 mmHg  Pulse 76  Temp(Src) 98.4 F (36.9 C) (Oral)  Resp 18  Ht 5\' 1"  (1.549 m)  Wt 185 lb (83.915 kg)  BMI 34.97 kg/m2  SpO2 100% Physical Exam  Constitutional: She appears well-developed and well-nourished.  HENT:  Head: Normocephalic.  Cardiovascular: Normal rate and intact distal pulses.  Exam reveals no decreased pulses.   Pulses:      Dorsalis pedis pulses are 2+ on the right side, and 2+ on the left side.       Posterior tibial pulses are 2+ on the right side, and 2+ on the left side.  Musculoskeletal: She exhibits edema  and tenderness.       Right ankle: Normal. She exhibits normal pulse. Achilles tendon normal.       Left ankle: She exhibits normal range of motion, no swelling, no deformity and normal pulse. Tenderness. Lateral malleolus tenderness found. No head of 5th metatarsal and no proximal fibula tenderness found. Achilles tendon normal.       Left foot: There is tenderness. There is no swelling, normal capillary refill, no crepitus and no deformity.       Feet:  Neurological: She is alert. No sensory deficit.  Skin: Skin is warm, dry and intact.  Nursing note and vitals reviewed.   ED Course  Procedures (including critical care time) Labs Review Labs Reviewed - No data to display  Imaging Review Dg Ankle Complete Left  05/20/2014   CLINICAL DATA:  38 year old female status post twisting injury and fall with acute pain. Initial encounter.  EXAM: LEFT ANKLE COMPLETE - 3+ VIEW  COMPARISON:  Left ankle series 723 2012. Left foot series from today reported separately.  FINDINGS: Calcaneus intact. Bone mineralization is within normal limits. No joint effusion identified. Mortise joint alignment stable and within normal limits. Talar dome intact. No acute fracture or dislocation identified.  IMPRESSION: No acute fracture or dislocation identified about the left ankle.   Electronically Signed   By: Lars Pinks M.D.   On: 05/20/2014 18:25   Dg Foot Complete Left  05/20/2014   CLINICAL DATA:  38 year old female status post twisting injury and fall with acute pain. Initial encounter.  EXAM: LEFT FOOT - COMPLETE 3+ VIEW  COMPARISON:  Left ankle series 7/23/ 2012.  FINDINGS: Bone mineralization is within normal limits. Calcaneus intact. Joint spaces and alignment within normal limits. No fracture or dislocation identified. Small accessory ossicles adjacent to the cuboid and navicular.  IMPRESSION: No acute fracture or dislocation identified about the left foot.   Electronically Signed   By: Lars Pinks M.D.   On:  05/20/2014 18:24     EKG Interpretation None      MDM   Final diagnoses:  Foot sprain, left, initial encounter    ASO and crutches provided.  Cap refill normal after ASO applied.  RICE, f/u with pcp in 7-10 days if not improving. Patients labs and/or radiological studies were viewed and considered during the medical decision making and disposition process.     Evalee Jefferson, PA-C 05/22/14 1253  Ezequiel Essex, MD 05/22/14 [phone]

## 2014-07-17 ENCOUNTER — Encounter (HOSPITAL_COMMUNITY): Payer: Self-pay | Admitting: Emergency Medicine

## 2014-07-17 ENCOUNTER — Emergency Department (HOSPITAL_COMMUNITY)
Admission: EM | Admit: 2014-07-17 | Discharge: 2014-07-17 | Disposition: A | Discharge status: Home / Self Care | Payer: Medicaid Other | Attending: Emergency Medicine | Admitting: Emergency Medicine

## 2014-07-17 DIAGNOSIS — K029 Dental caries, unspecified: Secondary | ICD-10-CM | POA: Insufficient documentation

## 2014-07-17 DIAGNOSIS — Z87891 Personal history of nicotine dependence: Secondary | ICD-10-CM | POA: Insufficient documentation

## 2014-07-17 DIAGNOSIS — Z79899 Other long term (current) drug therapy: Secondary | ICD-10-CM | POA: Insufficient documentation

## 2014-07-17 DIAGNOSIS — Z8739 Personal history of other diseases of the musculoskeletal system and connective tissue: Secondary | ICD-10-CM | POA: Insufficient documentation

## 2014-07-17 DIAGNOSIS — Z87442 Personal history of urinary calculi: Secondary | ICD-10-CM | POA: Insufficient documentation

## 2014-07-17 DIAGNOSIS — Z87448 Personal history of other diseases of urinary system: Secondary | ICD-10-CM | POA: Insufficient documentation

## 2014-07-17 MED ORDER — AMOXICILLIN 500 MG PO CAPS: 500.0000 mg | ORAL_CAPSULE | Freq: Three times a day (TID) | ORAL | Status: DC

## 2014-07-17 MED ORDER — TRAMADOL HCL 50 MG PO TABS: 50.0000 mg | ORAL_TABLET | Freq: Four times a day (QID) | ORAL | Status: DC | PRN

## 2014-07-17 NOTE — Discharge Instructions (Signed)
Dental Caries Dental caries is tooth decay. This decay can cause a hole in teeth (cavity) that can get bigger and deeper over time. HOME CARE  Brush and floss your teeth. Do this at least two times a day.  Use a fluoride toothpaste.  Use a mouth rinse if told by your dentist or doctor.  Eat less sugary and starchy foods. Drink less sugary drinks.  Avoid snacking often on sugary and starchy foods. Avoid sipping often on sugary drinks.  Keep regular checkups and cleanings with your dentist.  Use fluoride supplements if told by your dentist or doctor.  Allow fluoride to be applied to teeth if told by your dentist or doctor. Document Released: 02/19/2008 Document Revised: 09/26/2013 Document Reviewed: 05/14/2012 [facility] Patient Information 2015 [place], [place]. This information is not intended to replace advice given to you by your health care provider. Make sure you discuss any questions you have with your health care provider.  Dental Pain A tooth ache may be caused by cavities (tooth decay). Cavities expose the nerve of the tooth to air and hot or cold temperatures. It may come from an infection or abscess (also called a boil or furuncle) around your tooth. It is also often caused by dental caries (tooth decay). This causes the pain you are having. DIAGNOSIS  Your caregiver can diagnose this problem by exam. TREATMENT   If caused by an infection, it may be treated with medications which kill germs (antibiotics) and pain medications as prescribed by your caregiver. Take medications as directed.  Only take over-the-counter or prescription medicines for pain, discomfort, or fever as directed by your caregiver.  Whether the tooth ache today is caused by infection or dental disease, you should see your dentist as soon as possible for further care. SEEK MEDICAL CARE IF: The exam and treatment you received today has been provided on an emergency basis only. This is not a substitute for  complete medical or dental care. If your problem worsens or new problems (symptoms) appear, and you are unable to meet with your dentist, call or return to this location. SEEK IMMEDIATE MEDICAL CARE IF:   You have a fever.  You develop redness and swelling of your face, jaw, or neck.  You are unable to open your mouth.  You have severe pain uncontrolled by pain medicine. MAKE SURE YOU:   Understand these instructions.  Will watch your condition.  Will get help right away if you are not doing well or get worse. Document Released: 05/12/2005 Document Revised: 08/04/2011 Document Reviewed: 12/29/2007 [facility] Patient Information 2015 [place], [place]. This information is not intended to replace advice given to you by your health care provider. Make sure you discuss any questions you have with your health care provider.  Dental Care and Dentist Visits Dental care supports good overall health. Regular dental visits can also help you avoid dental pain, bleeding, infection, and other more serious health problems in the future. It is important to keep the mouth healthy because diseases in the teeth, gums, and other oral tissues can spread to other areas of the body. Some problems, such as diabetes, heart disease, and pre-term labor have been associated with poor oral health.  See your dentist every 6 months. If you experience emergency problems such as a toothache or broken tooth, go to the dentist right away. If you see your dentist regularly, you may catch problems early. It is easier to be treated for problems in the early stages.  WHAT TO EXPECT  AT A DENTIST VISIT  Your dentist will look for many common oral health problems and recommend proper treatment. At your regular dental visit, you can expect:  Gentle cleaning of the teeth and gums. This includes scraping and polishing. This helps to remove the sticky substance around the teeth and gums (plaque). Plaque forms in the mouth shortly after  eating. Over time, plaque hardens on the teeth as tartar. If tartar is not removed regularly, it can cause problems. Cleaning also helps remove stains.  Periodic X-rays. These pictures of the teeth and supporting bone will help your dentist assess the health of your teeth.  Periodic fluoride treatments. Fluoride is a natural mineral shown to help strengthen teeth. Fluoride treatmentinvolves applying a fluoride gel or varnish to the teeth. It is most commonly done in children.  Examination of the mouth, tongue, jaws, teeth, and gums to look for any oral health problems, such as:  Cavities (dental caries). This is decay on the tooth caused by plaque, sugar, and acid in the mouth. It is best to catch a cavity when it is small.  Inflammation of the gums caused by plaque buildup (gingivitis).  Problems with the mouth or malformed or misaligned teeth.  Oral cancer or other diseases of the soft tissues or jaws. KEEP YOUR TEETH AND GUMS HEALTHY For healthy teeth and gums, follow these general guidelines as well as your dentist's specific advice:  Have your teeth professionally cleaned at the dentist every 6 months.  Brush twice daily with a fluoride toothpaste.  Floss your teeth daily.  Ask your dentist if you need fluoride supplements, treatments, or fluoride toothpaste.  Eat a healthy diet. Reduce foods and drinks with added sugar.  Avoid smoking. TREATMENT FOR ORAL HEALTH PROBLEMS If you have oral health problems, treatment varies depending on the conditions present in your teeth and gums.  Your caregiver will most likely recommend good oral hygiene at each visit.  For cavities, gingivitis, or other oral health disease, your caregiver will perform a procedure to treat the problem. This is typically done at a separate appointment. Sometimes your caregiver will refer you to another dental specialist for specific tooth problems or for surgery. SEEK IMMEDIATE DENTAL CARE IF:  You have  pain, bleeding, or soreness in the gum, tooth, jaw, or mouth area.  A permanent tooth becomes loose or separated from the gum socket.  You experience a blow or injury to the mouth or jaw area. Document Released: 01/22/2011 Document Revised: 08/04/2011 Document Reviewed: 01/22/2011 [facility] Patient Information 2015 [place], [place]. This information is not intended to replace advice given to you by your health care provider. Make sure you discuss any questions you have with your health care provider.

## 2014-07-17 NOTE — ED Provider Notes (Signed)
CSN: [id number]     Arrival date & time 07/17/14  1812 History  This chart was scribed for Debroah Baller, NP with Ezequiel Essex, MD by Edison Simon, ED Scribe. This patient was seen in room APFT22/APFT22 and the patient's care was started at 7:29 PM.    Chief Complaint  Patient presents with  . Dental Pain   Patient is a 39 y.o. female presenting with tooth pain. The history is provided by the patient. No language interpreter was used.  Dental Pain Location:  Lower Lower teeth location:  19/LL 1st molar and 18/LL 2nd molar Quality:  Unable to specify Severity:  Mild Onset quality:  Sudden Duration:  2 days Timing:  Constant Progression:  Unchanged Chronicity:  New Context: dental caries   Relieved by:  Acetaminophen Worsened by:  Nothing tried Ineffective treatments:  None tried Associated symptoms: no fever   Risk factors comment:  History of cardiac arrhythma   HPI Comments: Samantha [name] is a 39 y.o. female who presents to the Emergency Department complaining of dental pain to left lower fist molar with onset 2 days ago. She reports left sided headache and ear pain. She states she has been using Tylenol with mild improvement. She states she does not have insurance and does not work so she cannot afford to go to dentist. Her probation office told her about a free clinic which she called but could not be seen for another week; he then referred her here since she has history of heart arrhythmia. She states her heart intermittantly seems to skip beats then "beats real fast" to catch up. She states this happens a few times approximately every other day. She reports prior workup with [facility]. She states she was on medication for it 2 years ago but now cannot afford it. She reports baseline nausea and vomiting almost every day. She denies fever or chills.   Past Medical History  Diagnosis Date  . Celiac disease   . Interstitial cystitis   . Arrhythmia   . DDD (degenerative disc  disease)   . Kidney stones   . Kidney stones   . Kidney stone   . Degenerative disc disease, lumbar    Past Surgical History  Procedure Laterality Date  . Cesarean section    . Tonsillectomy    . Appendectomy    . Abdominal hysterectomy    . Hernia repair    . Adenoidectomy    . Kidney stone surgery     History reviewed. No pertinent family history. History  Substance Use Topics  . Smoking status: Former Smoker -- 0.50 packs/day    Types: Cigarettes  . Smokeless tobacco: Not on file  . Alcohol Use: No   OB History    No data available     Review of Systems  Constitutional: Negative for fever and chills.  HENT: Positive for dental problem.   Gastrointestinal: Negative for nausea (baseline) and vomiting (baseline).  All other systems reviewed and are negative.     Allergies  Gluten  Home Medications   Prior to Admission medications   Medication Sig Start Date End Date Taking? Authorizing Provider  albuterol (PROVENTIL HFA;VENTOLIN HFA) 108 (90 BASE) MCG/ACT inhaler Inhale 2 puffs into the lungs every 6 (six) hours as needed for wheezing or shortness of breath.    Historical Provider, MD  amoxicillin (AMOXIL) 500 MG capsule Take 1 capsule (500 mg total) by mouth 3 (three) times daily. 07/17/14   Ailanie Ruttan Bunnie Pion, NP  promethazine (PHENERGAN) 25 MG tablet Take 1 tablet (25 mg total) by mouth every 6 (six) hours as needed. Patient not taking: Reported on 05/20/2014 04/30/14   Nat Christen, MD  traMADol (ULTRAM) 50 MG tablet Take 1 tablet (50 mg total) by mouth every 6 (six) hours as needed. 07/17/14   Truett Mcfarlan Bunnie Pion, NP   BP 120/83 mmHg  Pulse 87  Temp(Src) 97.8 F (36.6 C) (Oral)  Resp 20  Ht 5\' 1"  (1.549 m)  Wt 190 lb (86.183 kg)  BMI 35.92 kg/m2  SpO2 95% Physical Exam  Constitutional: She is oriented to person, place, and time. She appears well-developed and well-nourished.  HENT:  Head: Normocephalic and atraumatic.  TMs clear, light reflex present First and  second molars in left lower jaw are both decayed Swelling and edema to gum surrounding those teeth  Eyes: Conjunctivae and EOM are normal. Pupils are equal, round, and reactive to light.  Neck: Normal range of motion. Neck supple.  Pulmonary/Chest: Effort normal.  Musculoskeletal: Normal range of motion.  Lymphadenopathy:    She has no cervical adenopathy.  Neurological: She is alert and oriented to person, place, and time.  Skin: Skin is warm and dry.  Psychiatric: She has a normal mood and affect.  Nursing note and vitals reviewed.   ED Course  Procedures (including critical care time)  DIAGNOSTIC STUDIES: Oxygen Saturation is 95% on room air, adequate by my interpretation.    COORDINATION OF CARE: 7:35 PM Discussed treatment plan with patient at beside, the patient agrees with the plan and has no further questions at this time.   MDM  39 y.o. female with dental pain due to caries. Stable for d/c without fever and does not appear toxic. Will treat for infection and pain. She will see a dentist as soon as possible. Discussed with the patient clinical findings and plan of care. All questioned fully answered.  Final diagnoses:  Pain due to dental caries  I personally performed the services described in this documentation, which was scribed in my presence. The recorded information has been reviewed and is accurate.    [address] [place], [place] 07/19/14 2637  Ezequiel Essex, MD 07/19/14 [phone]

## 2014-07-17 NOTE — ED Notes (Signed)
Pt states that she has a bad tooth on the bottom left that has been causing problems for the past 2 days.

## 2014-08-08 ENCOUNTER — Emergency Department (HOSPITAL_COMMUNITY)
Admission: EM | Admit: 2014-08-08 | Discharge: 2014-08-08 | Disposition: A | Discharge status: Home / Self Care | Payer: Self-pay

## 2014-08-27 ENCOUNTER — Emergency Department (HOSPITAL_COMMUNITY): Payer: Medicaid Other

## 2014-08-27 ENCOUNTER — Emergency Department (HOSPITAL_COMMUNITY)
Admission: EM | Admit: 2014-08-27 | Discharge: 2014-08-27 | Disposition: A | Discharge status: Home / Self Care | Payer: Medicaid Other | Attending: Emergency Medicine | Admitting: Emergency Medicine

## 2014-08-27 ENCOUNTER — Encounter (HOSPITAL_COMMUNITY): Payer: Self-pay | Admitting: Cardiology

## 2014-08-27 DIAGNOSIS — Z87442 Personal history of urinary calculi: Secondary | ICD-10-CM | POA: Insufficient documentation

## 2014-08-27 DIAGNOSIS — Z87448 Personal history of other diseases of urinary system: Secondary | ICD-10-CM | POA: Insufficient documentation

## 2014-08-27 DIAGNOSIS — Z79899 Other long term (current) drug therapy: Secondary | ICD-10-CM | POA: Insufficient documentation

## 2014-08-27 DIAGNOSIS — Z8679 Personal history of other diseases of the circulatory system: Secondary | ICD-10-CM | POA: Insufficient documentation

## 2014-08-27 DIAGNOSIS — Y9389 Activity, other specified: Secondary | ICD-10-CM | POA: Insufficient documentation

## 2014-08-27 DIAGNOSIS — Z8739 Personal history of other diseases of the musculoskeletal system and connective tissue: Secondary | ICD-10-CM | POA: Insufficient documentation

## 2014-08-27 DIAGNOSIS — S93602A Unspecified sprain of left foot, initial encounter: Secondary | ICD-10-CM

## 2014-08-27 DIAGNOSIS — Z8719 Personal history of other diseases of the digestive system: Secondary | ICD-10-CM | POA: Insufficient documentation

## 2014-08-27 DIAGNOSIS — W1830XA Fall on same level, unspecified, initial encounter: Secondary | ICD-10-CM | POA: Insufficient documentation

## 2014-08-27 DIAGNOSIS — Y998 Other external cause status: Secondary | ICD-10-CM | POA: Insufficient documentation

## 2014-08-27 DIAGNOSIS — S9032XA Contusion of left foot, initial encounter: Secondary | ICD-10-CM | POA: Insufficient documentation

## 2014-08-27 DIAGNOSIS — Y9289 Other specified places as the place of occurrence of the external cause: Secondary | ICD-10-CM | POA: Insufficient documentation

## 2014-08-27 DIAGNOSIS — Z87891 Personal history of nicotine dependence: Secondary | ICD-10-CM | POA: Insufficient documentation

## 2014-08-27 MED ORDER — HYDROCODONE-ACETAMINOPHEN 5-325 MG PO TABS
1.0000 | ORAL_TABLET | Freq: Once | ORAL | Status: AC
Start: 2014-08-27 — End: 2014-08-27
  Administered 2014-08-27: 1 via ORAL
  Filled 2014-08-27: qty 1

## 2014-08-27 MED ORDER — DICLOFENAC SODIUM 75 MG PO TBEC: 75.0000 mg | DELAYED_RELEASE_TABLET | Freq: Two times a day (BID) | ORAL | Status: DC

## 2014-08-27 NOTE — Progress Notes (Signed)
ED/CM noted patient did not have health insurance and/or PCP listed in the computer.  Patient was given the [facility] resources handout with information on the clinics, food pantries, and the handout for new health insurance sign-up. Provided drug discount card.

## 2014-08-27 NOTE — ED Provider Notes (Signed)
CSN: [id number]     Arrival date & time 08/27/14  0917 History   First MD Initiated Contact with Patient 08/27/14 [phone]     Chief Complaint  Patient presents with  . Fall     (Consider location/radiation/quality/duration/timing/severity/associated sxs/prior Treatment) Patient is a 39 y.o. female presenting with lower extremity pain. The history is provided by the patient.  Foot Pain This is a new problem. The current episode started yesterday. The problem occurs constantly. The problem has been unchanged. The symptoms are aggravated by walking. She has tried nothing for the symptoms.   JOLEENE BURNHAM is a 39 y.o. female who presents to the ED with left ankle and foot pain s/p fall yesterday. She states she was in the yard and her dog had dug a hole and she stepped in it and turned her foot under. She denies any other injuries.   Past Medical History  Diagnosis Date  . Celiac disease   . Interstitial cystitis   . Arrhythmia   . DDD (degenerative disc disease)   . Kidney stones   . Kidney stones   . Kidney stone   . Degenerative disc disease, lumbar    Past Surgical History  Procedure Laterality Date  . Cesarean section    . Tonsillectomy    . Appendectomy    . Abdominal hysterectomy    . Hernia repair    . Adenoidectomy    . Kidney stone surgery     History reviewed. No pertinent family history. History  Substance Use Topics  . Smoking status: Former Smoker -- 0.50 packs/day    Types: Cigarettes  . Smokeless tobacco: Not on file  . Alcohol Use: No   OB History    No data available     Review of Systems  Musculoskeletal:       Left foot pain      Allergies  Gluten  Home Medications   Prior to Admission medications   Medication Sig Start Date End Date Taking? Authorizing Provider  acetaminophen (TYLENOL) 325 MG tablet Take 975 mg by mouth every 6 (six) hours as needed for mild pain.   Yes Historical Provider, MD  albuterol (PROVENTIL HFA;VENTOLIN HFA) 108  (90 BASE) MCG/ACT inhaler Inhale 2 puffs into the lungs every 6 (six) hours as needed for wheezing or shortness of breath.    Historical Provider, MD  diclofenac (VOLTAREN) 75 MG EC tablet Take 1 tablet (75 mg total) by mouth 2 (two) times daily. 08/27/14   Leng Montesdeoca Bunnie Pion, NP   BP 111/73 mmHg  Pulse 66  Temp(Src) 98.1 F (36.7 C) (Oral)  Ht 5\' 1"  (1.549 m)  Wt 190 lb (86.183 kg)  BMI 35.92 kg/m2  SpO2 97% Physical Exam  Constitutional: She is oriented to person, place, and time. She appears well-developed and well-nourished. No distress.  HENT:  Head: Normocephalic.  Eyes: EOM are normal.  Neck: Neck supple.  Cardiovascular: Normal rate.   Pulmonary/Chest: Effort normal.  Musculoskeletal: Normal range of motion.       Left foot: There is tenderness. There is normal range of motion, normal capillary refill, no crepitus, no deformity and no laceration. Swelling: minimal.       Feet:  Tenderness with ecchymosis to the dorsum of the left foot at the base of the 4th and 5th MT area.   Neurological: She is alert and oriented to person, place, and time. No cranial nerve deficit.  Skin: Skin is warm and dry.  Psychiatric: She has  a normal mood and affect. Her behavior is normal.  Nursing note and vitals reviewed.   ED Course  Procedures (including critical care time) Labs Review Labs Reviewed - No data to display  Imaging Review Dg Foot Complete Left  08/27/2014   CLINICAL DATA:  Twisting injury. Pain across metatarsals with the majority of pain at the left fifth metatarsal.  EXAM: LEFT FOOT - COMPLETE 3+ VIEW  COMPARISON:  05/20/2014  FINDINGS: No acute fracture or dislocation. No significant soft tissue swelling.  IMPRESSION: No acute osseous abnormality.   Electronically Signed   By: Abigail Miyamoto M.D.   On: 08/27/2014 10:35   MDM  39 y.o. female with pain and swelling to the dorsum of the left foot s/p injury yesterday. Stable for d/c without neurovascular compromise. Ace wrap applied,  ice, elevation and pain management. Patient to follow up with ortho if symptoms worsen.  Discussed with the patient and all questioned fully answered.    Final diagnoses:  Foot sprain, left, initial encounter      [facility], NP 08/27/14 1112  Nat Christen, MD 08/29/14 1242

## 2014-08-27 NOTE — ED Notes (Signed)
[address] yesterday out in yard.  C/o pain to left ankle and foot.

## 2014-11-20 ENCOUNTER — Emergency Department (HOSPITAL_COMMUNITY)
Admission: EM | Admit: 2014-11-20 | Discharge: 2014-11-20 | Disposition: A | Discharge status: Home / Self Care | Payer: Medicaid Other | Attending: Emergency Medicine | Admitting: Emergency Medicine

## 2014-11-20 ENCOUNTER — Encounter (HOSPITAL_COMMUNITY): Payer: Self-pay | Admitting: *Deleted

## 2014-11-20 DIAGNOSIS — Z87442 Personal history of urinary calculi: Secondary | ICD-10-CM | POA: Insufficient documentation

## 2014-11-20 DIAGNOSIS — Z8719 Personal history of other diseases of the digestive system: Secondary | ICD-10-CM | POA: Insufficient documentation

## 2014-11-20 DIAGNOSIS — Z791 Long term (current) use of non-steroidal anti-inflammatories (NSAID): Secondary | ICD-10-CM | POA: Insufficient documentation

## 2014-11-20 DIAGNOSIS — Z8742 Personal history of other diseases of the female genital tract: Secondary | ICD-10-CM | POA: Insufficient documentation

## 2014-11-20 DIAGNOSIS — M778 Other enthesopathies, not elsewhere classified: Secondary | ICD-10-CM | POA: Insufficient documentation

## 2014-11-20 DIAGNOSIS — Z87891 Personal history of nicotine dependence: Secondary | ICD-10-CM | POA: Insufficient documentation

## 2014-11-20 MED ORDER — DICLOFENAC SODIUM 75 MG PO TBEC: 75.0000 mg | DELAYED_RELEASE_TABLET | Freq: Two times a day (BID) | ORAL | Status: DC

## 2014-11-20 MED ORDER — TRAMADOL HCL 50 MG PO TABS: 50.0000 mg | ORAL_TABLET | Freq: Four times a day (QID) | ORAL | Status: DC | PRN

## 2014-11-20 NOTE — ED Provider Notes (Signed)
CSN: [id number]     Arrival date & time 11/20/14  1409 History   This chart was scribed for non-physician practitioner, Kem Parkinson, PA-C working with Francine Graven, DO found by Tula Nakayama, ED scribe. This patient was seen in room APFT23/APFT23 and the patient's care was started at 4:15 PM    Chief Complaint  Patient presents with  . Joint Swelling   The history is provided by the patient. No language interpreter was used.   HPI Comments: Samantha [name] is a 39 y.o. female with a history of interstitial cystitis who presents to the Emergency Department complaining of constant, gradually worsening swelling of her bilateral hands, right worse than left, that started last week and became worse this morning. She states right elbow pain and tingling as associated symptoms. Pt has tried elevating her hands with no relief. 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 She is right-handed. Pt denies weakness, CP and SOB, neck pain, as associated symptoms.  Past Medical History  Diagnosis Date  . Celiac disease   . Interstitial cystitis   . Arrhythmia   . DDD (degenerative disc disease)   . Kidney stones   . Kidney stones   . Kidney stone   . Degenerative disc disease, lumbar    Past Surgical History  Procedure Laterality Date  . Cesarean section    . Tonsillectomy    . Appendectomy    . Abdominal hysterectomy    . Hernia repair    . Adenoidectomy    . Kidney stone surgery     History reviewed. No pertinent family history. History  Substance Use Topics  . Smoking status: Former Smoker -- 0.50 packs/day    Types: Cigarettes  . Smokeless tobacco: Not on file  . Alcohol Use: No   OB History    No data available     Review of Systems  Respiratory: Negative for shortness of breath.   Cardiovascular: Negative for chest pain.  Musculoskeletal: Positive for joint swelling and arthralgias.  Skin: Negative for wound.  Neurological: Negative for weakness.  All other systems  reviewed and are negative.  Allergies  Gluten  Home Medications   Prior to Admission medications   Medication Sig Start Date End Date Taking? Authorizing Provider  diclofenac (VOLTAREN) 75 MG EC tablet Take 1 tablet (75 mg total) by mouth 2 (two) times daily. 08/27/14   Hope Bunnie Pion, NP   BP 112/73 mmHg  Pulse 81  Temp(Src) 97.3 F (36.3 C) (Oral)  Resp 18  Ht 5\' 1"  (1.549 m)  Wt 180 lb (81.647 kg)  BMI 34.03 kg/m2  SpO2 98% Physical Exam  Constitutional: She appears well-developed and well-nourished. No distress.  HENT:  Head: Normocephalic and atraumatic.  Eyes: Conjunctivae and EOM are normal.  Neck: Neck supple. No tracheal deviation present.  Cardiovascular: Normal rate.   Pulmonary/Chest: Effort normal. No respiratory distress.  Musculoskeletal: She exhibits edema.  Mild edema of dorsal right hand, no erythema; limited flexion and extension of the fingers due to pain; positive Finkelstein test on the right; cap refill <2s; distal sensation intact  Skin: Skin is warm and dry.  Psychiatric: She has a normal mood and affect. Her behavior is normal.  Nursing note and vitals reviewed.   ED Course  Procedures   DIAGNOSTIC STUDIES: Oxygen Saturation is 98% on RA, normal by my interpretation.    COORDINATION OF CARE: 4:19 PM Discussed suspicion for tendinitis and treatment plan with pt which includes  a wrist brace, anti-inflammatories and pain management. Advised pt to try ice. Pt agreed to plan.    Labs Review Labs Reviewed - No data to display  Imaging Review No results found.   EKG Interpretation None      MDM   Final diagnoses:  Tendonitis of wrist, right    Pt with  Likely non-infectious tendonitis.  Pt agrees to symptomatic tx , velcro wrist splint and close orthopedic f/u if needed.    I personally performed the services described in this documentation, which was scribed in my presence. The recorded information has been reviewed and is  accurate.    Kem Parkinson, PA-C 11/22/14 2214  Francine Graven, DO 11/23/14 1453

## 2014-11-20 NOTE — ED Notes (Signed)
Bilateral hand swelling since Wednesday last week.  Worse at night and upon waking they are numb and tingling.

## 2014-11-20 NOTE — Discharge Instructions (Signed)
Repetitive Strain Injuries  Repetitive strain injuries (RSIs) result from overuse or misuse of soft tissues including muscles, tendons, or nerves. Tendons are the cord-like structures that attach muscles to bones. RSIs can affect almost any part of the body. However, RSIs are most common in the arms (thumbs, wrists, elbows, shoulders) and legs (ankles, knees). Common medical conditions that are often caused by repetitive strain include carpal tunnel syndrome, tennis or golfer's elbow, bursitis, and tendonitis. If RSIs are treated early, and therepeated activity is reduced or removed, the severity and length of your problems can usually be reduced. RSIs are also called cumulative trauma disorders (CTD).   CAUSES   Many RSIs occur due to repeating the same activity at work over weeks or months without sufficient rest, such as prolonged typing. RSIs also commonly occur when a hobby or sport is done repeatedly without sufficient rest. RSIs can also occur due to repeated strain or stress on a body part in someone who has one or more risk factors for RSIs.  RISK FACTORS  Workplace risk factors   Frequent computer use, especially if your workstation is not adjusted for your body type.   Infrequent rest breaks.   Working in a high-pressure environment.   Working at a fast pace.   Repeating the same motion, such as frequent typing.   Working in an awkward position or holding the same position for a long time.   Forceful movements such as lifting, pulling, or pushing.   Vibration caused by using power tools.   Working in cold temperatures.   Job stress.  Personal risk factors   Poor posture.   Being loose-jointed.   Not exercising regularly.   Being overweight.   Arthritis, diabetes, thyroid problems, or other long-term (chronic)medical conditions.   Vitamin deficiencies.   Keeping your fingernails long.   An unhealthy, stressful, or inactive lifestyle.   Not sleeping well.  SYMPTOMS   Symptoms often  begin at work but become more noticeable after the repeated stress has ended. For example, you may develop fatigue or soreness in your wrist while typingat work, and at night you may develop numbness and tingling in your fingers. Common symptoms include:    Burning, shooting, or aching pain, especially in the fingers, palms, wrists, forearms, or shoulders.   Tenderness.   Swelling.   Tingling, numbness, or loss of feeling.   Pain with certain activities, such as turning a doorknob or reaching above your head.   Weakness, heaviness, or loss of coordination in yourhand.   Muscle spasms or tightness.  In some cases, symptoms can become so intense that it is difficult to perform everyday tasks. Symptoms that do not improve with rest may indicate a more serious condition.   DIAGNOSIS   Your caregiver may determine the type ofRSI you have based on your medical evaluation and a description of your activities.   TREATMENT   Treatment depends on the severity and type of RSI you have. Your caregiver may recommend rest for the affected body part, medicines, and physical or occupational therapy to reduce pain, swelling, and soreness. Discuss the activities you do repeatedly with your caregiver. Your caregiver can help you decide whether you need to change your activities. An RSI may take months or years to heal, especially if the affected body part gets insufficient rest. In some cases, such as severe carpal tunnel syndrome, surgery may be recommended.  PREVENTION   Talk with your supervisor to make sure you have the proper equipment   for your work station.   Maintain good posture at your desk or work station with:   Feet flat on the floor.   Knees directly over the feet, bent at a right angle.   Lower back supported by your chair or a cushion in the curve of your lower back.   Shoulders and arms relaxed and at your sides.   Neck relaxed and not bent forwards or backwards.   Your desk and computer workstation  properly adjusted to your body type.   Your chair adjusted so there is no excess pressure on the back of your thighs.   The keyboard resting above your thighs. You should be able to reach the keys with your elbows at your side, bent at a right angle. Your arms should be supported on forearm rests, with your forearms parallel to the ground.   The computer mouse within easy reach.   The monitor directly in front of you, so that your eyes are aligned with the top of the screen. The screen should be about 15 to 25 inches from your eyes.   While typing, keep your wrist straight, in a neutral position. Move your entire arm when you move your mouse or when typing hard-to-reach keys.   Only use your computer as much as you need to for work. Do not use it during breaks.   Take breaks often from any repeated activity. Alternate with another task which requires you to use different muscles, or rest at least once every hour.   Change positions regularly. If you spend a lot of time sitting, get up, walk around, and stretch.   Do not hold pens or pencils tightly when writing.   Exercise regularly.   Maintain a normal weight.   Eat a diet with plenty of vegetables, whole grains, and fruit.   Get sufficient, restful sleep.  HOME CARE INSTRUCTIONS   If your caregiver prescribed medicine to help reduce swelling, take it as directed.   Only take over-the-counter or prescription medicines for pain, discomfort, or fever as directed by your caregiver.   Reduce, and if needed, stopthe activities that are causing your problems until you have no further symptoms.If your symptoms are work-related, you may need to talk to your supervisor about changing your activities.   When symptoms develop, put ice or a cold pack on the aching area.   Put ice in a plastic bag.   Place a towel between your skin and the bag.   Leave the ice on for 15-20 minutes.   If you were given a splint to keep your wrist from bending, wear it as  instructed. It is important to wear the splint at night. Use the splint for as long as your caregiver recommends.  SEEK MEDICAL CARE IF:   You develop new problems.   Your problems do not get better with medicine.  MAKE SURE YOU:   Understand these instructions.   Will watch your condition.   Will get help right away if you are not doing well or get worse.  Document Released: 05/02/2002 Document Revised: 11/11/2011 Document Reviewed: 07/03/2011  [employer] Patient Information 2015 [employer]. This information is not intended to replace advice given to you by your health care provider. Make sure you discuss any questions you have with your health care provider.

## 2014-11-25 ENCOUNTER — Emergency Department (HOSPITAL_COMMUNITY): Payer: Medicaid Other

## 2014-11-25 ENCOUNTER — Emergency Department (HOSPITAL_COMMUNITY)
Admission: EM | Admit: 2014-11-25 | Discharge: 2014-11-25 | Disposition: A | Discharge status: Home / Self Care | Payer: Medicaid Other | Attending: Emergency Medicine | Admitting: Emergency Medicine

## 2014-11-25 ENCOUNTER — Encounter (HOSPITAL_COMMUNITY): Payer: Self-pay | Admitting: Emergency Medicine

## 2014-11-25 DIAGNOSIS — Y998 Other external cause status: Secondary | ICD-10-CM | POA: Insufficient documentation

## 2014-11-25 DIAGNOSIS — X58XXXA Exposure to other specified factors, initial encounter: Secondary | ICD-10-CM | POA: Insufficient documentation

## 2014-11-25 DIAGNOSIS — Y9389 Activity, other specified: Secondary | ICD-10-CM | POA: Insufficient documentation

## 2014-11-25 DIAGNOSIS — Z87442 Personal history of urinary calculi: Secondary | ICD-10-CM | POA: Insufficient documentation

## 2014-11-25 DIAGNOSIS — Z8679 Personal history of other diseases of the circulatory system: Secondary | ICD-10-CM | POA: Insufficient documentation

## 2014-11-25 DIAGNOSIS — Z8719 Personal history of other diseases of the digestive system: Secondary | ICD-10-CM | POA: Insufficient documentation

## 2014-11-25 DIAGNOSIS — Z87891 Personal history of nicotine dependence: Secondary | ICD-10-CM | POA: Insufficient documentation

## 2014-11-25 DIAGNOSIS — S93402A Sprain of unspecified ligament of left ankle, initial encounter: Secondary | ICD-10-CM | POA: Insufficient documentation

## 2014-11-25 DIAGNOSIS — Y9289 Other specified places as the place of occurrence of the external cause: Secondary | ICD-10-CM | POA: Insufficient documentation

## 2014-11-25 DIAGNOSIS — Z87448 Personal history of other diseases of urinary system: Secondary | ICD-10-CM | POA: Insufficient documentation

## 2014-11-25 DIAGNOSIS — Z8739 Personal history of other diseases of the musculoskeletal system and connective tissue: Secondary | ICD-10-CM | POA: Insufficient documentation

## 2014-11-25 MED ORDER — HYDROCODONE-ACETAMINOPHEN 5-325 MG PO TABS: 1.0000 | ORAL_TABLET | ORAL | Status: DC | PRN

## 2014-11-25 MED ORDER — IBUPROFEN 800 MG PO TABS
800.0000 mg | ORAL_TABLET | Freq: Once | ORAL | Status: AC
  Administered 2014-11-25: 800 mg via ORAL
  Filled 2014-11-25: qty 1

## 2014-11-25 MED ORDER — IBUPROFEN 600 MG PO TABS: 600.0000 mg | ORAL_TABLET | Freq: Four times a day (QID) | ORAL | Status: DC | PRN

## 2014-11-25 NOTE — ED Notes (Signed)
Pt reports left ankle pain since yesterday. Pt reports stepped off of a porch and reports hearing multiple "pops". Pt reports increased pain with ambulation and movement. No obvious deformity noted.

## 2014-11-25 NOTE — ED Provider Notes (Signed)
CSN: [id number]     Arrival date & time 11/25/14  1546 History   First MD Initiated Contact with Patient 11/25/14 1605     Chief Complaint  Patient presents with  . Ankle Pain     (Consider location/radiation/quality/duration/timing/severity/associated sxs/prior Treatment) The history is provided by the patient.   Samantha [name] is a 39 y.o. female presenting with left ankle pain since having an inversion injury yesterday.  She stepped off of her porch and reports hearing multiple "pops" as she stepped on her left lateral foot edge.  She was able to bear weight initially, as time has progressed her pain has become worsened.  She works in [employer] and has worked yesterday and today with difficulty secondary to pain.  She denies radiation of pain.  She has taken Tylenol without relief of symptoms.     Past Medical History  Diagnosis Date  . Celiac disease   . Interstitial cystitis   . Arrhythmia   . DDD (degenerative disc disease)   . Kidney stones   . Kidney stones   . Kidney stone   . Degenerative disc disease, lumbar    Past Surgical History  Procedure Laterality Date  . Cesarean section    . Tonsillectomy    . Appendectomy    . Abdominal hysterectomy    . Hernia repair    . Adenoidectomy    . Kidney stone surgery     History reviewed. No pertinent family history. History  Substance Use Topics  . Smoking status: Former Smoker -- 0.50 packs/day    Types: Cigarettes  . Smokeless tobacco: Not on file  . Alcohol Use: No   OB History    No data available     Review of Systems  Musculoskeletal: Positive for joint swelling and arthralgias.  Skin: Negative for wound.  Neurological: Negative for weakness and numbness.      Allergies  Gluten and Tramadol  Home Medications   Prior to Admission medications   Medication Sig Start Date End Date Taking? Authorizing Provider  acetaminophen (TYLENOL) 500 MG tablet Take 1,000 mg by mouth every 6 (six) hours as  needed.   Yes Historical Provider, MD  HYDROcodone-acetaminophen (NORCO/VICODIN) 5-325 MG per tablet Take 1 tablet by mouth every 4 (four) hours as needed. 11/25/14   Evalee Jefferson, PA-C  ibuprofen (ADVIL,MOTRIN) 600 MG tablet Take 1 tablet (600 mg total) by mouth every 6 (six) hours as needed. 11/25/14   Evalee Jefferson, PA-C  traMADol (ULTRAM) 50 MG tablet Take 1 tablet (50 mg total) by mouth every 6 (six) hours as needed. 11/20/14   Tammy Triplett, PA-C   BP 105/90 mmHg  Pulse 99  Temp(Src) 98 F (36.7 C) (Oral)  Resp 18  Ht 5\' 1"  (1.549 m)  Wt 180 lb (81.647 kg)  BMI 34.03 kg/m2  SpO2 100% Physical Exam  Constitutional: She appears well-developed and well-nourished.  HENT:  Head: Normocephalic.  Cardiovascular: Normal rate and intact distal pulses.  Exam reveals no decreased pulses.   Pulses:      Dorsalis pedis pulses are 2+ on the right side, and 2+ on the left side.       Posterior tibial pulses are 2+ on the right side, and 2+ on the left side.  Musculoskeletal: She exhibits edema and tenderness.       Right ankle: Normal.       Left ankle: She exhibits swelling and ecchymosis. She exhibits no deformity and normal pulse. Tenderness. Lateral malleolus  tenderness found. No head of 5th metatarsal and no proximal fibula tenderness found. Achilles tendon normal.  Neurological: She is alert. No sensory deficit.  Skin: Skin is warm, dry and intact.  Nursing note and vitals reviewed.   ED Course  Procedures (including critical care time) Labs Review Labs Reviewed - No data to display  Imaging Review Dg Ankle Complete Left  11/25/2014   CLINICAL DATA:  Medial and lateral pain, 1 day duration, after stepping off a porch into a hole with twisting of the ankle.  EXAM: LEFT ANKLE COMPLETE - 3+ VIEW  COMPARISON:  None.  FINDINGS: There is no evidence of fracture, dislocation, or joint effusion. There is no evidence of arthropathy or other focal bone abnormality. Soft tissues are unremarkable.   IMPRESSION: Negative.   Electronically Signed   By: Nelson Chimes M.D.   On: 11/25/2014 16:25     EKG Interpretation None      MDM   Final diagnoses:  Ankle sprain, left, initial encounter    X-rays were reviewed and discussed with patient.  She was placed in a ASO, crutches provided.  Discussed RICE,  follow-up with orthopedics if symptoms are not improving over the next 10 days.  She is prescribed ibuprofen and hydrocodone.  Ice pack provided.    Evalee Jefferson, PA-C 11/26/14 [place], DO 11/28/14 [phone]

## 2014-11-25 NOTE — ED Notes (Signed)
Pt verbalized understanding of no driving and to use caution within 4 hours of taking pain meds due to meds cause drowsiness 

## 2014-11-25 NOTE — Discharge Instructions (Signed)

## 2015-03-21 ENCOUNTER — Emergency Department (HOSPITAL_COMMUNITY)
Admission: EM | Admit: 2015-03-21 | Discharge: 2015-03-21 | Disposition: A | Discharge status: Home / Self Care | Payer: Medicaid Other | Attending: Emergency Medicine | Admitting: Emergency Medicine

## 2015-03-21 ENCOUNTER — Encounter (HOSPITAL_COMMUNITY): Payer: Self-pay | Admitting: Emergency Medicine

## 2015-03-21 DIAGNOSIS — Z8739 Personal history of other diseases of the musculoskeletal system and connective tissue: Secondary | ICD-10-CM | POA: Insufficient documentation

## 2015-03-21 DIAGNOSIS — Z87442 Personal history of urinary calculi: Secondary | ICD-10-CM | POA: Insufficient documentation

## 2015-03-21 DIAGNOSIS — J018 Other acute sinusitis: Secondary | ICD-10-CM | POA: Insufficient documentation

## 2015-03-21 DIAGNOSIS — Z8719 Personal history of other diseases of the digestive system: Secondary | ICD-10-CM | POA: Insufficient documentation

## 2015-03-21 DIAGNOSIS — Z8679 Personal history of other diseases of the circulatory system: Secondary | ICD-10-CM | POA: Insufficient documentation

## 2015-03-21 DIAGNOSIS — J029 Acute pharyngitis, unspecified: Secondary | ICD-10-CM | POA: Insufficient documentation

## 2015-03-21 DIAGNOSIS — Z9889 Other specified postprocedural states: Secondary | ICD-10-CM | POA: Insufficient documentation

## 2015-03-21 DIAGNOSIS — Z87891 Personal history of nicotine dependence: Secondary | ICD-10-CM | POA: Insufficient documentation

## 2015-03-21 DIAGNOSIS — J329 Chronic sinusitis, unspecified: Secondary | ICD-10-CM

## 2015-03-21 DIAGNOSIS — Z87448 Personal history of other diseases of urinary system: Secondary | ICD-10-CM | POA: Insufficient documentation

## 2015-03-21 MED ORDER — AMOXICILLIN 500 MG PO CAPS: 500.0000 mg | ORAL_CAPSULE | Freq: Three times a day (TID) | ORAL | Status: DC

## 2015-03-21 MED ORDER — PREDNISONE 50 MG PO TABS
60.0000 mg | ORAL_TABLET | Freq: Once | ORAL | Status: AC
  Administered 2015-03-21: 60 mg via ORAL
  Filled 2015-03-21 (×2): qty 1

## 2015-03-21 MED ORDER — IBUPROFEN 600 MG PO TABS: 600.0000 mg | ORAL_TABLET | Freq: Four times a day (QID) | ORAL | Status: DC | PRN

## 2015-03-21 MED ORDER — IBUPROFEN 800 MG PO TABS
800.0000 mg | ORAL_TABLET | Freq: Once | ORAL | Status: AC
  Administered 2015-03-21: 800 mg via ORAL
  Filled 2015-03-21: qty 1

## 2015-03-21 MED ORDER — AMOXICILLIN 250 MG PO CAPS
500.0000 mg | ORAL_CAPSULE | Freq: Once | ORAL | Status: AC
  Administered 2015-03-21: 500 mg via ORAL
  Filled 2015-03-21: qty 2

## 2015-03-21 NOTE — ED Notes (Signed)
Pt states that she has had a sore throat and intermittent fever x 2 days.

## 2015-03-21 NOTE — ED Provider Notes (Signed)
CSN: [id number]     Arrival date & time 03/21/15  1820 History   First MD Initiated Contact with Patient 03/21/15 1904     Chief Complaint  Patient presents with  . Sore Throat     (Consider location/radiation/quality/duration/timing/severity/associated sxs/prior Treatment) Patient is a 39 y.o. female presenting with pharyngitis. The history is provided by the patient.  Sore Throat This is a new problem. The current episode started in the past 7 days. The problem occurs intermittently. The problem has been gradually worsening. Associated symptoms include arthralgias, a fever, headaches and a sore throat. Pertinent negatives include no chills or rash. The symptoms are aggravated by swallowing. She has tried nothing for the symptoms. The treatment provided no relief.    Past Medical History  Diagnosis Date  . Celiac disease   . Interstitial cystitis   . Arrhythmia   . DDD (degenerative disc disease)   . Degenerative disc disease, lumbar   . Kidney stones   . Kidney stones   . Kidney stone    Past Surgical History  Procedure Laterality Date  . Cesarean section    . Tonsillectomy    . Appendectomy    . Abdominal hysterectomy    . Hernia repair    . Adenoidectomy    . Kidney stone surgery     History reviewed. No pertinent family history. Social History  Substance Use Topics  . Smoking status: Former Smoker -- 0.50 packs/day    Types: Cigarettes  . Smokeless tobacco: None  . Alcohol Use: No   OB History    No data available     Review of Systems  Constitutional: Positive for fever. Negative for chills.  HENT: Positive for sore throat.   Musculoskeletal: Positive for arthralgias.  Skin: Negative for rash.  Neurological: Positive for headaches.  All other systems reviewed and are negative.     Allergies  Gluten and Tramadol  Home Medications   Prior to Admission medications   Medication Sig Start Date End Date Taking? Authorizing Provider  acetaminophen  (TYLENOL) 500 MG tablet Take 1,000 mg by mouth every 6 (six) hours as needed.    Historical Provider, MD  HYDROcodone-acetaminophen (NORCO/VICODIN) 5-325 MG per tablet Take 1 tablet by mouth every 4 (four) hours as needed. 11/25/14   Evalee Jefferson, PA-C  ibuprofen (ADVIL,MOTRIN) 600 MG tablet Take 1 tablet (600 mg total) by mouth every 6 (six) hours as needed. 11/25/14   Evalee Jefferson, PA-C  traMADol (ULTRAM) 50 MG tablet Take 1 tablet (50 mg total) by mouth every 6 (six) hours as needed. 11/20/14   Tammy Triplett, PA-C   BP 118/70 mmHg  Pulse 60  Temp(Src) 98.1 F (36.7 C) (Oral)  Resp 16  Ht 5\' 1"  (1.549 m)  Wt 170 lb (77.111 kg)  BMI 32.14 kg/m2  SpO2 100% Physical Exam  Constitutional: She is oriented to person, place, and time. She appears well-developed and well-nourished.  Non-toxic appearance.  HENT:  Head: Normocephalic.  Right Ear: Tympanic membrane and external ear normal.  Left Ear: Tympanic membrane and external ear normal.  There is some soreness noted with movement of the years. The external auditory canals are clear. The tympanic membrane is pearly-gray and there is no bulging.  There is nasal congestion present.  There is increased redness of the posterior pharynx. The uvula is enlarged. The uvula is midline.  Eyes: EOM and lids are normal. Pupils are equal, round, and reactive to light.  Neck: Normal range of motion. Neck  supple. Carotid bruit is not present.  Cardiovascular: Normal rate, regular rhythm, normal heart sounds, intact distal pulses and normal pulses.   Pulmonary/Chest: Breath sounds normal. No respiratory distress.  Abdominal: Soft. Bowel sounds are normal. There is no tenderness. There is no guarding.  Musculoskeletal: Normal range of motion.  Lymphadenopathy:       Head (right side): No submandibular adenopathy present.       Head (left side): No submandibular adenopathy present.    She has no cervical adenopathy.  Neurological: She is alert and oriented to  person, place, and time. She has normal strength. No cranial nerve deficit or sensory deficit. She exhibits normal muscle tone. Coordination normal.  Skin: Skin is warm and dry. No rash noted.  Psychiatric: She has a normal mood and affect. Her speech is normal.  Nursing note and vitals reviewed.   ED Course  Procedures (including critical care time) Labs Review Labs Reviewed - No data to display  Imaging Review No results found. I have personally reviewed and evaluated these images and lab results as part of my medical decision-making.   EKG Interpretation None      MDM  Vital signs within normal limits. The examination suggest pharyngitis and sinusitis. The patient is treated with Amoxil and ibuprofen. She will use salt water gargles and Chloraseptic spray on. She is also been given a mask to use at home. The patient is to follow with her primary physician if any changes, problems, or concerns.    Final diagnoses:  None    *I have reviewed nursing notes, vital signs, and all appropriate lab and imaging results for this patient.[address] PA-C 03/21/15 1939  Davonna Belling, MD 03/22/15 [phone]

## 2015-03-21 NOTE — Discharge Instructions (Signed)
Please use salt water gargles 3-4 times daily. Please use Chloraseptic Spray before meals to assist with swallowing. Please use a mask until the symptoms have resolved. Amoxicillin 3 times daily, ibuprofen every 6 hours as needed for fever, or aching. Please wash hands frequently. Sinusitis, Adult Sinusitis is redness, soreness, and puffiness (inflammation) of the air pockets in the bones of your face (sinuses). The redness, soreness, and puffiness can cause air and mucus to get trapped in your sinuses. This can allow germs to grow and cause an infection.  HOME CARE   Drink enough fluids to keep your pee (urine) clear or pale yellow.  Use a humidifier in your home.  Run a hot shower to create steam in the bathroom. Sit in the bathroom with the door closed. Breathe in the steam 3-4 times a day.  Put a warm, moist washcloth on your face 3-4 times a day, or as told by your doctor.  Use salt water sprays (saline sprays) to wet the thick fluid in your nose. This can help the sinuses drain.  Only take medicine as told by your doctor. GET HELP RIGHT AWAY IF:   Your pain gets worse.  You have very bad headaches.  You are sick to your stomach (nauseous).  You throw up (vomit).  You are very sleepy (drowsy) all the time.  Your face is puffy (swollen).  Your vision changes.  You have a stiff neck.  You have trouble breathing. MAKE SURE YOU:   Understand these instructions.  Will watch your condition.  Will get help right away if you are not doing well or get worse.   This information is not intended to replace advice given to you by your health care provider. Make sure you discuss any questions you have with your health care provider.   Document Released: 10/29/2007 Document Revised: 06/02/2014 Document Reviewed: 12/16/2011 Elsevier Interactive Patient Education 2016 [employer]  Pharyngitis Pharyngitis is a sore throat (pharynx). There is redness, pain, and swelling of your  throat. HOME CARE   Drink enough fluids to keep your pee (urine) clear or pale yellow.  Only take medicine as told by your doctor.  You may get sick again if you do not take medicine as told. Finish your medicines, even if you start to feel better.  Do not take aspirin.  Rest.  Rinse your mouth (gargle) with salt water ( tsp of salt per 1 qt of water) every 1-2 hours. This will help the pain.  If you are not at risk for choking, you can suck on hard candy or sore throat lozenges. GET HELP IF:  You have large, tender lumps on your neck.  You have a rash.  You cough up green, yellow-brown, or bloody spit. GET HELP RIGHT AWAY IF:   You have a stiff neck.  You drool or cannot swallow liquids.  You throw up (vomit) or are not able to keep medicine or liquids down.  You have very bad pain that does not go away with medicine.  You have problems breathing (not from a stuffy nose). MAKE SURE YOU:   Understand these instructions.  Will watch your condition.  Will get help right away if you are not doing well or get worse.   This information is not intended to replace advice given to you by your health care provider. Make sure you discuss any questions you have with your health care provider.   Document Released: 10/29/2007 Document Revised: 03/02/2013 Document Reviewed: 01/17/2013 Elsevier  Interactive Patient Education ©2016 [employer] ° °

## 2015-05-23 ENCOUNTER — Encounter (HOSPITAL_COMMUNITY): Payer: Self-pay | Admitting: Emergency Medicine

## 2015-05-23 ENCOUNTER — Emergency Department (HOSPITAL_COMMUNITY)
Admission: EM | Admit: 2015-05-23 | Discharge: 2015-05-23 | Disposition: A | Discharge status: Home / Self Care | Payer: Medicaid Other | Attending: Emergency Medicine | Admitting: Emergency Medicine

## 2015-05-23 ENCOUNTER — Emergency Department (HOSPITAL_COMMUNITY): Payer: Self-pay

## 2015-05-23 ENCOUNTER — Emergency Department (HOSPITAL_COMMUNITY): Payer: Medicaid Other

## 2015-05-23 DIAGNOSIS — Y9289 Other specified places as the place of occurrence of the external cause: Secondary | ICD-10-CM | POA: Insufficient documentation

## 2015-05-23 DIAGNOSIS — Y998 Other external cause status: Secondary | ICD-10-CM | POA: Insufficient documentation

## 2015-05-23 DIAGNOSIS — Z792 Long term (current) use of antibiotics: Secondary | ICD-10-CM | POA: Insufficient documentation

## 2015-05-23 DIAGNOSIS — Z87891 Personal history of nicotine dependence: Secondary | ICD-10-CM | POA: Insufficient documentation

## 2015-05-23 DIAGNOSIS — S63502A Unspecified sprain of left wrist, initial encounter: Secondary | ICD-10-CM | POA: Insufficient documentation

## 2015-05-23 DIAGNOSIS — Z87448 Personal history of other diseases of urinary system: Secondary | ICD-10-CM | POA: Insufficient documentation

## 2015-05-23 DIAGNOSIS — Y9389 Activity, other specified: Secondary | ICD-10-CM | POA: Insufficient documentation

## 2015-05-23 DIAGNOSIS — Z87442 Personal history of urinary calculi: Secondary | ICD-10-CM | POA: Insufficient documentation

## 2015-05-23 DIAGNOSIS — Z8679 Personal history of other diseases of the circulatory system: Secondary | ICD-10-CM | POA: Insufficient documentation

## 2015-05-23 DIAGNOSIS — S53402A Unspecified sprain of left elbow, initial encounter: Secondary | ICD-10-CM | POA: Insufficient documentation

## 2015-05-23 DIAGNOSIS — Z8719 Personal history of other diseases of the digestive system: Secondary | ICD-10-CM | POA: Insufficient documentation

## 2015-05-23 DIAGNOSIS — W010XXA Fall on same level from slipping, tripping and stumbling without subsequent striking against object, initial encounter: Secondary | ICD-10-CM | POA: Insufficient documentation

## 2015-05-23 DIAGNOSIS — Z8739 Personal history of other diseases of the musculoskeletal system and connective tissue: Secondary | ICD-10-CM | POA: Insufficient documentation

## 2015-05-23 MED ORDER — IBUPROFEN 800 MG PO TABS: 800.0000 mg | ORAL_TABLET | Freq: Three times a day (TID) | ORAL | Status: DC

## 2015-05-23 MED ORDER — OXYCODONE-ACETAMINOPHEN 5-325 MG PO TABS
1.0000 | ORAL_TABLET | Freq: Once | ORAL | Status: AC
  Administered 2015-05-23: 1 via ORAL
  Filled 2015-05-23: qty 1

## 2015-05-23 MED ORDER — OXYCODONE-ACETAMINOPHEN 5-325 MG PO TABS: 1.0000 | ORAL_TABLET | ORAL | Status: DC | PRN

## 2015-05-23 NOTE — ED Notes (Signed)
Patient states she tripped and fell this morning landing on her left elbow. Complaining of pain to left elbow and "tingling" down left arm since injury. Also states she hit the back of her head. Denies LOC or head pain.

## 2015-05-23 NOTE — Discharge Instructions (Signed)
Wrist Sprain °A wrist sprain is a stretch or tear in the strong, fibrous tissues (ligaments) that connect your wrist bones. The ligaments of your wrist may be easily sprained. There are three types of wrist sprains. °· Grade 1. The ligament is not stretched or torn, but the sprain causes pain. °· Grade 2. The ligament is stretched or partially torn. You may be able to move your wrist, but not very much. °· Grade 3. The ligament or muscle completely tears. You may find it difficult or extremely painful to move your wrist even a little. °CAUSES °Often, wrist sprains are a result of a fall or an injury. The force of the impact causes the fibers of your ligament to stretch too much or tear. Common causes of wrist sprains include: °· Overextending your wrist while catching a ball with your hands. °· Repetitive or strenuous extension or bending of your wrist. °· Landing on your hand during a fall. °RISK FACTORS °· Having previous wrist injuries. °· Playing contact sports, such as boxing or wrestling. °· Participating in activities in which falling is common. °· Having poor wrist strength and flexibility. °SIGNS AND SYMPTOMS °· Wrist pain. °· Wrist tenderness. °· Inflammation or bruising of the wrist area. °· Hearing a "pop" or feeling a tear at the time of the injury. °· Decreased wrist movement due to pain, stiffness, or weakness. °DIAGNOSIS °Your health care provider will examine your wrist. In some cases, an X-ray will be taken to make sure you did not break any bones. If your health care provider thinks that you tore a ligament, he or she may order an MRI of your wrist. °TREATMENT °Treatment involves resting and icing your wrist. You may also need to take pain medicines to help lessen pain and inflammation. Your health care provider may recommend keeping your wrist still (immobilized) with a splint to help your sprain heal. When the splint is no longer necessary, you may need to perform strengthening and stretching  exercises. These exercises help you to regain strength and full range of motion in your wrist. Surgery is not usually needed for wrist sprains unless the ligament completely tears. °HOME CARE INSTRUCTIONS °· Rest your wrist. Do not do things that cause pain. °· Wear your wrist splint as directed by your health care provider. °· Take medicines only as directed by your health care provider. °· To ease pain and swelling, apply ice to the injured area. °¨ Put ice in a plastic bag. °¨ Place a towel between your skin and the bag. °¨ Leave the ice on for 20 minutes, 2-3 times a day. °SEEK MEDICAL CARE IF: °· Your pain, discomfort, or swelling gets worse even with treatment. °· You feel sudden numbness in your hand. °  °This information is not intended to replace advice given to you by your health care provider. Make sure you discuss any questions you have with your health care provider. °  °Document Released: 01/13/2014 Document Reviewed: 01/13/2014 °Elsevier Interactive Patient Education ©2016 Elsevier Inc. ° °

## 2015-05-26 NOTE — ED Provider Notes (Signed)
CSN: [id number]     Arrival date & time 05/23/15  1139 History   First MD Initiated Contact with Patient 05/23/15 1207     Chief Complaint  Patient presents with  . Fall  . Arm Pain     (Consider location/radiation/quality/duration/timing/severity/associated sxs/prior Treatment) HPI  Samantha [name] is a 39 y.o. female who presents to the Emergency Department complaining of left wrist and elbow pain and swelling.  She states that she fell, landing on her left arm.  Reports immediate pain and swelling to the wrist.  Pain is worse with movement and extension of the arm.  She has not tried any medications or therapies.  She states that she struck her head, but denies LOC, headache, visual changes, dizziness, vomiting or hematoma.     Past Medical History  Diagnosis Date  . Celiac disease   . Interstitial cystitis   . Arrhythmia   . DDD (degenerative disc disease)   . Degenerative disc disease, lumbar   . Kidney stones   . Kidney stones   . Kidney stone    Past Surgical History  Procedure Laterality Date  . Cesarean section    . Tonsillectomy    . Appendectomy    . Abdominal hysterectomy    . Hernia repair    . Adenoidectomy    . Kidney stone surgery     History reviewed. No pertinent family history. Social History  Substance Use Topics  . Smoking status: Former Smoker -- 0.50 packs/day    Types: Cigarettes  . Smokeless tobacco: None  . Alcohol Use: No   OB History    No data available     Review of Systems  Constitutional: Negative for fever and chills.  Musculoskeletal: Positive for joint swelling and arthralgias (left wrist and elbow pain).  Skin: Negative for color change and wound.  Neurological: Negative for weakness and numbness.  All other systems reviewed and are negative.     Allergies  Gluten and Tramadol  Home Medications   Prior to Admission medications   Medication Sig Start Date End Date Taking? Authorizing Provider  acetaminophen (TYLENOL)  500 MG tablet Take 1,000 mg by mouth every 6 (six) hours as needed.    Historical Provider, MD  amoxicillin (AMOXIL) 500 MG capsule Take 1 capsule (500 mg total) by mouth 3 (three) times daily. 03/21/15   Lily Kocher, PA-C  HYDROcodone-acetaminophen (NORCO/VICODIN) 5-325 MG per tablet Take 1 tablet by mouth every 4 (four) hours as needed. 11/25/14   Evalee Jefferson, PA-C  ibuprofen (ADVIL,MOTRIN) 800 MG tablet Take 1 tablet (800 mg total) by mouth 3 (three) times daily. 05/23/15   Trystian Crisanto, PA-C  oxyCODONE-acetaminophen (PERCOCET/ROXICET) 5-325 MG tablet Take 1 tablet by mouth every 4 (four) hours as needed. 05/23/15   Dominik Lauricella, PA-C  traMADol (ULTRAM) 50 MG tablet Take 1 tablet (50 mg total) by mouth every 6 (six) hours as needed. 11/20/14   Sebastion Jun, PA-C   BP 121/65 mmHg  Pulse 80  Temp(Src) 98.8 F (37.1 C) (Oral)  Resp 14  Ht 5\' 1"  (1.549 m)  Wt 77.565 kg  BMI 32.33 kg/m2  SpO2 100% Physical Exam  Constitutional: She is oriented to person, place, and time. She appears well-developed and well-nourished. No distress.  HENT:  Head: Normocephalic and atraumatic.  Cardiovascular: Normal rate, regular rhythm and normal heart sounds.   Pulmonary/Chest: Effort normal and breath sounds normal. She exhibits no tenderness.  Musculoskeletal: She exhibits tenderness. She exhibits no edema.  Diffuse ttp of the left wrist and elbow.  Radial pulse is brisk, distal sensation intact.  CR< 2 sec.  No bruising or bony deformity.  Patient has full ROM. Compartments soft.  Neurological: She is alert and oriented to person, place, and time. She exhibits normal muscle tone. Coordination normal.  Skin: Skin is warm and dry.  Nursing note and vitals reviewed.   ED Course  Procedures (including critical care time) Labs Review Labs Reviewed - No data to display  Imaging Review Dg Elbow Complete Left  05/23/2015  CLINICAL DATA:  Patient fell and hit LEFT elbow.  Pain. EXAM: LEFT ELBOW -  COMPLETE 3+ VIEW COMPARISON:  None. FINDINGS: There is no evidence of fracture, or dislocation. There may be a posterior soft tissue hematoma, but no joint effusion. There is no evidence of arthropathy or other focal bone abnormality. Soft tissues are unremarkable. IMPRESSION: Negative for fracture or effusion. Electronically Signed   By: Staci Righter M.D.   On: 05/23/2015 13:04   Dg Wrist Complete Left  05/23/2015  CLINICAL DATA:  Tripped, fell this morning landing on left elbow. Left elbow pain. Pain radiates to left wrist. EXAM: LEFT WRIST - COMPLETE 3+ VIEW COMPARISON:  None. FINDINGS: There is no evidence of fracture or dislocation. There is no evidence of arthropathy or other focal bone abnormality. Soft tissues are unremarkable. IMPRESSION: Negative. Electronically Signed   By: Rolm Baptise M.D.   On: 05/23/2015 13:08    I have personally reviewed and evaluated these images and lab results as part of my medical decision-making.   EKG Interpretation None      MDM   Final diagnoses:  Wrist sprain, left, initial encounter  Elbow sprain, left, initial encounter   Pt is well appearing.  XRs neg for fx.    Wrist splint applied.  Remains NV intact.  Sling also applied.  rx for ibuprofen, percocet.  Ortho referral given.  Pt agrees to elevate and ice.  Kem Parkinson, PA-C 05/26/15 2104  Ezequiel Essex, MD 05/27/15 [phone]

## 2015-07-30 ENCOUNTER — Emergency Department (HOSPITAL_COMMUNITY)
Admission: EM | Admit: 2015-07-30 | Discharge: 2015-07-30 | Disposition: A | Discharge status: Home / Self Care | Payer: MEDICAID | Attending: Emergency Medicine | Admitting: Emergency Medicine

## 2015-07-30 ENCOUNTER — Encounter (HOSPITAL_COMMUNITY): Payer: Self-pay | Admitting: Emergency Medicine

## 2015-07-30 DIAGNOSIS — Z791 Long term (current) use of non-steroidal anti-inflammatories (NSAID): Secondary | ICD-10-CM | POA: Insufficient documentation

## 2015-07-30 DIAGNOSIS — F17210 Nicotine dependence, cigarettes, uncomplicated: Secondary | ICD-10-CM | POA: Insufficient documentation

## 2015-07-30 DIAGNOSIS — Z9089 Acquired absence of other organs: Secondary | ICD-10-CM | POA: Insufficient documentation

## 2015-07-30 DIAGNOSIS — R05 Cough: Secondary | ICD-10-CM | POA: Insufficient documentation

## 2015-07-30 DIAGNOSIS — Z87442 Personal history of urinary calculi: Secondary | ICD-10-CM | POA: Insufficient documentation

## 2015-07-30 DIAGNOSIS — R112 Nausea with vomiting, unspecified: Secondary | ICD-10-CM | POA: Insufficient documentation

## 2015-07-30 DIAGNOSIS — L03115 Cellulitis of right lower limb: Secondary | ICD-10-CM | POA: Insufficient documentation

## 2015-07-30 DIAGNOSIS — Z792 Long term (current) use of antibiotics: Secondary | ICD-10-CM | POA: Insufficient documentation

## 2015-07-30 DIAGNOSIS — Z90710 Acquired absence of both cervix and uterus: Secondary | ICD-10-CM | POA: Insufficient documentation

## 2015-07-30 HISTORY — DX: Other chronic pain: G89.29

## 2015-07-30 MED ORDER — ONDANSETRON 4 MG PO TBDP: 4.0000 mg | ORAL_TABLET | Freq: Three times a day (TID) | ORAL | Status: DC | PRN

## 2015-07-30 MED ORDER — SULFAMETHOXAZOLE-TRIMETHOPRIM 800-160 MG PO TABS: 1.0000 | ORAL_TABLET | Freq: Two times a day (BID) | ORAL | Status: AC

## 2015-07-30 MED ORDER — ONDANSETRON 4 MG PO TBDP
4.0000 mg | ORAL_TABLET | Freq: Once | ORAL | Status: AC
  Administered 2015-07-30: 4 mg via ORAL
  Filled 2015-07-30: qty 1

## 2015-07-30 NOTE — ED Notes (Signed)
Medicated with zofran.   Tolerating small sips of coke without nausea/vomiting.  Given crackers and peanut butter.

## 2015-07-30 NOTE — ED Notes (Signed)
Pt presents with a productive cough for unknown amount of time. Pt also has an abscess on her right thigh that started x1 week ago. In addition to these symptoms, pt presents with emesis throughout the week.. VS stable.

## 2015-07-30 NOTE — ED Provider Notes (Signed)
CSN: [id number]     Arrival date & time 07/30/15  0725 History   First MD Initiated Contact with Patient 07/30/15 [phone]     Chief Complaint  Patient presents with  . Cough  . Abscess     (Consider location/radiation/quality/duration/timing/severity/associated sxs/prior Treatment) Patient is a 40 y.o. female presenting with cough and abscess. The history is provided by the patient. No language interpreter was used.  Cough Cough characteristics:  Non-productive Severity:  Moderate Onset quality:  Gradual Duration:  1 day Timing:  Constant Progression:  Worsening Chronicity:  New Context: sick contacts   Relieved by:  Nothing Worsened by:  Nothing tried Ineffective treatments:  None tried Associated symptoms: fever   Abscess Associated symptoms: fever   Pt reports she vomitted multiple times  Past Medical History  Diagnosis Date  . Celiac disease   . Interstitial cystitis   . Arrhythmia   . DDD (degenerative disc disease)   . Degenerative disc disease, lumbar   . Kidney stones   . Chronic pain    Past Surgical History  Procedure Laterality Date  . Cesarean section    . Tonsillectomy    . Appendectomy    . Abdominal hysterectomy    . Hernia repair    . Adenoidectomy    . Kidney stone surgery     No family history on file. Social History  Substance Use Topics  . Smoking status: Current Every Day Smoker -- 0.50 packs/day    Types: Cigarettes  . Smokeless tobacco: None  . Alcohol Use: No   OB History    No data available     Review of Systems  Constitutional: Positive for fever.  Respiratory: Positive for cough.   All other systems reviewed and are negative.     Allergies  Gluten and Tramadol  Home Medications   Prior to Admission medications   Medication Sig Start Date End Date Taking? Authorizing Provider  acetaminophen (TYLENOL) 500 MG tablet Take 1,000 mg by mouth every 6 (six) hours as needed.    Historical Provider, MD  amoxicillin (AMOXIL)  500 MG capsule Take 1 capsule (500 mg total) by mouth 3 (three) times daily. 03/21/15   Lily Kocher, PA-C  HYDROcodone-acetaminophen (NORCO/VICODIN) 5-325 MG per tablet Take 1 tablet by mouth every 4 (four) hours as needed. 11/25/14   Evalee Jefferson, PA-C  ibuprofen (ADVIL,MOTRIN) 800 MG tablet Take 1 tablet (800 mg total) by mouth 3 (three) times daily. 05/23/15   Tammy Triplett, PA-C  oxyCODONE-acetaminophen (PERCOCET/ROXICET) 5-325 MG tablet Take 1 tablet by mouth every 4 (four) hours as needed. 05/23/15   Tammy Triplett, PA-C  traMADol (ULTRAM) 50 MG tablet Take 1 tablet (50 mg total) by mouth every 6 (six) hours as needed. 11/20/14   Tammy Triplett, PA-C   BP 127/72 mmHg  Pulse 63  Temp(Src) 97.9 F (36.6 C) (Oral)  Resp 16  Ht 5\' 1"  (1.549 m)  Wt 82.555 kg  BMI 34.41 kg/m2  SpO2 100% Physical Exam  Constitutional: She is oriented to person, place, and time. She appears well-developed and well-nourished.  HENT:  Head: Normocephalic and atraumatic.  Right Ear: External ear normal.  Left Ear: External ear normal.  Mouth/Throat: Oropharynx is clear and moist.  Eyes: Conjunctivae and EOM are normal. Pupils are equal, round, and reactive to light.  Neck: Normal range of motion.  Cardiovascular: Normal rate.   Pulmonary/Chest: Effort normal.  Abdominal: Soft. She exhibits no distension.  Musculoskeletal: Normal range of motion.  Neurological: She  is alert and oriented to person, place, and time.  Skin: There is erythema.  Golf ball size area of redness leg,  Dried center, appears to have drained  Psychiatric: She has a normal mood and affect.  Nursing note and vitals reviewed.   ED Course  Procedures (including critical care time) Labs Review Labs Reviewed - No data to display  Imaging Review No results found. I have personally reviewed and evaluated these images and lab results as part of my medical decision-making.   EKG Interpretation None        Pt given odt zofran.  I  suspect viral illness.   I will treat skin infection with bactrim.     Final diagnoses:  Non-intractable vomiting with nausea, vomiting of unspecified type  Cellulitis of right lower extremity    Meds ordered this encounter  Medications  . ondansetron (ZOFRAN-ODT) disintegrating tablet 4 mg    Sig:   . ondansetron (ZOFRAN ODT) 4 MG disintegrating tablet    Sig: Take 1 tablet (4 mg total) by mouth every 8 (eight) hours as needed for nausea or vomiting.    Dispense:  10 tablet    Refill:  0    Order Specific Question:  Supervising Provider    Answer:  Sabra Heck, BRIAN [[id number]]  . sulfamethoxazole-trimethoprim (BACTRIM DS,SEPTRA DS) 800-160 MG tablet    Sig: Take 1 tablet by mouth 2 (two) times daily.    Dispense:  14 tablet    Refill:  0    Order Specific Question:  Supervising Provider    Answer:  Noemi Chapel [[id number]]  An After Visit Summary was printed and given to the patient.    Hollace Kinnier [place], PA-C 07/30/15 [id number]  Virgel Manifold, MD 07/30/15 [phone]

## 2015-07-30 NOTE — Discharge Instructions (Signed)
Nausea and Vomiting Nausea means you feel sick to your stomach. Throwing up (vomiting) is a reflex where stomach contents come out of your mouth. HOME CARE   Take medicine as told by your doctor.  Do not force yourself to eat. However, you do need to drink fluids.  If you feel like eating, eat a normal diet as told by your doctor.  Eat rice, wheat, potatoes, bread, lean meats, yogurt, fruits, and vegetables.  Avoid high-fat foods.  Drink enough fluids to keep your pee (urine) clear or pale yellow.  Ask your doctor how to replace body fluid losses (rehydrate). Signs of body fluid loss (dehydration) include:  Feeling very thirsty.  Dry lips and mouth.  Feeling dizzy.  Dark pee.  Peeing less than normal.  Feeling confused.  Fast breathing or heart rate. GET HELP RIGHT AWAY IF:   You have blood in your throw up.  You have black or bloody poop (stool).  You have a bad headache or stiff neck.  You feel confused.  You have bad belly (abdominal) pain.  You have chest pain or trouble breathing.  You do not pee at least once every 8 hours.  You have cold, clammy skin.  You keep throwing up after 24 to 48 hours.  You have a fever. MAKE SURE YOU:   Understand these instructions.  Will watch your condition.  Will get help right away if you are not doing well or get worse.   This information is not intended to replace advice given to you by your health care provider. Make sure you discuss any questions you have with your health care provider.   Document Released: 10/29/2007 Document Revised: 08/04/2011 Document Reviewed: 10/11/2010 Elsevier Interactive Patient Education 2016 Elsevier Inc.  Cellulitis Cellulitis is an infection of the skin and the tissue beneath it. The infected area is usually red and tender. Cellulitis occurs most often in the arms and lower legs.  CAUSES  Cellulitis is caused by bacteria that enter the skin through cracks or cuts in the skin.  The most common types of bacteria that cause cellulitis are staphylococci and streptococci. SIGNS AND SYMPTOMS   Redness and warmth.  Swelling.  Tenderness or pain.  Fever. DIAGNOSIS  Your health care provider can usually determine what is wrong based on a physical exam. Blood tests may also be done. TREATMENT  Treatment usually involves taking an antibiotic medicine. HOME CARE INSTRUCTIONS   Take your antibiotic medicine as directed by your health care provider. Finish the antibiotic even if you start to feel better.  Keep the infected arm or leg elevated to reduce swelling.  Apply a warm cloth to the affected area up to 4 times per day to relieve pain.  Take medicines only as directed by your health care provider.  Keep all follow-up visits as directed by your health care provider. SEEK MEDICAL CARE IF:   You notice red streaks coming from the infected area.  Your red area gets larger or turns dark in color.  Your bone or joint underneath the infected area becomes painful after the skin has healed.  Your infection returns in the same area or another area.  You notice a swollen bump in the infected area.  You develop new symptoms.  You have a fever. SEEK IMMEDIATE MEDICAL CARE IF:   You feel very sleepy.  You develop vomiting or diarrhea.  You have a general ill feeling (malaise) with muscle aches and pains.   This information is not  intended to replace advice given to you by your health care provider. Make sure you discuss any questions you have with your health care provider.   Document Released: 02/19/2005 Document Revised: 01/31/2015 Document Reviewed: 07/28/2011 Elsevier Interactive Patient Education [employer].

## 2015-08-12 ENCOUNTER — Emergency Department (HOSPITAL_COMMUNITY): Payer: MEDICAID

## 2015-08-12 ENCOUNTER — Emergency Department (HOSPITAL_COMMUNITY)
Admission: EM | Admit: 2015-08-12 | Discharge: 2015-08-12 | Disposition: A | Discharge status: Home / Self Care | Payer: MEDICAID | Attending: Emergency Medicine | Admitting: Emergency Medicine

## 2015-08-12 ENCOUNTER — Encounter (HOSPITAL_COMMUNITY): Payer: Self-pay | Admitting: *Deleted

## 2015-08-12 DIAGNOSIS — S9032XA Contusion of left foot, initial encounter: Secondary | ICD-10-CM | POA: Insufficient documentation

## 2015-08-12 DIAGNOSIS — Y929 Unspecified place or not applicable: Secondary | ICD-10-CM | POA: Insufficient documentation

## 2015-08-12 DIAGNOSIS — Y999 Unspecified external cause status: Secondary | ICD-10-CM | POA: Insufficient documentation

## 2015-08-12 DIAGNOSIS — W208XXA Other cause of strike by thrown, projected or falling object, initial encounter: Secondary | ICD-10-CM | POA: Insufficient documentation

## 2015-08-12 DIAGNOSIS — Y939 Activity, unspecified: Secondary | ICD-10-CM | POA: Insufficient documentation

## 2015-08-12 DIAGNOSIS — F17210 Nicotine dependence, cigarettes, uncomplicated: Secondary | ICD-10-CM | POA: Insufficient documentation

## 2015-08-12 DIAGNOSIS — Z79899 Other long term (current) drug therapy: Secondary | ICD-10-CM | POA: Insufficient documentation

## 2015-08-12 MED ORDER — DICLOFENAC SODIUM 50 MG PO TBEC: 50.0000 mg | DELAYED_RELEASE_TABLET | Freq: Two times a day (BID) | ORAL | Status: DC

## 2015-08-12 NOTE — ED Provider Notes (Signed)
History  By signing my name below, I, Samantha [name], attest that this documentation has been prepared under the direction and in the presence of Samantha [name], [place]. Electronically Signed: [name], ED Scribe. 08/12/2015. 12:50 PM.  Chief Complaint  Patient presents with  . Foot Injury   The history is provided by the patient and medical records. No language interpreter was used.    HPI Comments:  Samantha [name] is a 40 y.o. female who presents to the Emergency Department complaining of left foot pain that began yesterday secondary to dropping a couch on it. She states she dropped a case of water on the same foot about one month ago and has been experiencing pain since. She reports associated swelling. She has not taken anything for the pain. Bearing weight increases the pain. She denies alleviating factors. She denies ankle pain, bruising, wounds, numbness, tingling or weakness of the left foot or leg.   Past Medical History  Diagnosis Date  . Celiac disease   . Interstitial cystitis   . Arrhythmia   . DDD (degenerative disc disease)   . Degenerative disc disease, lumbar   . Kidney stones   . Chronic pain    Past Surgical History  Procedure Laterality Date  . Cesarean section    . Tonsillectomy    . Appendectomy    . Abdominal hysterectomy    . Hernia repair    . Adenoidectomy    . Kidney stone surgery     No family history on file. Social History  Substance Use Topics  . Smoking status: Current Every Day Smoker -- 0.50 packs/day    Types: Cigarettes  . Smokeless tobacco: None  . Alcohol Use: No   OB History    No data available     Review of Systems  Musculoskeletal: Positive for arthralgias.  All other systems reviewed and are negative.   Allergies  Gluten and Tramadol  Home Medications   Prior to Admission medications   Medication Sig Start Date End Date Taking? Authorizing Provider  acetaminophen (TYLENOL) 500 MG tablet Take 1,000 mg by mouth  every 6 (six) hours as needed.    Historical Provider, MD  amoxicillin (AMOXIL) 500 MG capsule Take 1 capsule (500 mg total) by mouth 3 (three) times daily. 03/21/15   Lily Kocher, PA-C  HYDROcodone-acetaminophen (NORCO/VICODIN) 5-325 MG per tablet Take 1 tablet by mouth every 4 (four) hours as needed. 11/25/14   Evalee Jefferson, PA-C  ibuprofen (ADVIL,MOTRIN) 800 MG tablet Take 1 tablet (800 mg total) by mouth 3 (three) times daily. 05/23/15   Tammy Triplett, PA-C  ondansetron (ZOFRAN ODT) 4 MG disintegrating tablet Take 1 tablet (4 mg total) by mouth every 8 (eight) hours as needed for nausea or vomiting. 07/30/15   Fransico Meadow, PA-C  oxyCODONE-acetaminophen (PERCOCET/ROXICET) 5-325 MG tablet Take 1 tablet by mouth every 4 (four) hours as needed. 05/23/15   Tammy Triplett, PA-C  traMADol (ULTRAM) 50 MG tablet Take 1 tablet (50 mg total) by mouth every 6 (six) hours as needed. 11/20/14   Tammy Triplett, PA-C   Triage Vitals: BP 106/71 mmHg  Pulse 75  Temp(Src) 98.2 F (36.8 C) (Oral)  Resp 16  Ht 5\' 1"  (1.549 m)  Wt 180 lb (81.647 kg)  BMI 34.03 kg/m2  SpO2 100% Physical Exam  Constitutional: She is oriented to person, place, and time. She appears well-developed and well-nourished.  HENT:  Head: Normocephalic and atraumatic.  Eyes: EOM are normal.  Neck: Normal range  of motion.  Cardiovascular: Normal rate.   Pulmonary/Chest: Effort normal.  Musculoskeletal: Normal range of motion. She exhibits tenderness. She exhibits no edema.  Tenderness at 5th metatarsal head  Neurological: She is alert and oriented to person, place, and time.  Skin: Skin is warm and dry.  Psychiatric: She has a normal mood and affect. Her behavior is normal.  Nursing note and vitals reviewed.   ED Course  Procedures (including critical care time) DIAGNOSTIC STUDIES: Oxygen Saturation is 100% on RA, normal by my interpretation.   COORDINATION OF CARE: 12:48 PM- Will wait for X-Ray to result. Pt verbalizes  understanding and agrees to plan.  Medications - No data to display  Labs Review Labs Reviewed - No data to display  Imaging Review Dg Foot Complete Left  08/12/2015  CLINICAL DATA:  Left foot pain after dropping a piece of furniture on her foot last night. Left foot fracture last year. EXAM: LEFT FOOT - COMPLETE 3+ VIEW COMPARISON:  08/27/2014. FINDINGS: There is no evidence of fracture or dislocation. There is no evidence of arthropathy or other focal bone abnormality. Soft tissues are unremarkable. IMPRESSION: Normal examination. Electronically Signed   By: Claudie Revering M.D.   On: 08/12/2015 12:59   I have personally reviewed and evaluated these images and lab results as part of my medical decision-making.   EKG Interpretation None      MDMace wrap. Post op shoe,  Voltaren rx   Final diagnoses:  Contusion, foot, left, initial encounter  An After Visit Summary was printed and given to the patient. I personally performed the services described in this documentation, which was scribed in my presence. The recorded information has been reviewed and is accurate.    [name], PA-C 08/12/15 [place], MD 08/13/15 [phone]

## 2015-08-12 NOTE — Discharge Instructions (Signed)

## 2015-08-12 NOTE — ED Notes (Signed)
Pt states she dropped a case of bottle water on her foot last month. He foot got better. Yesterday she dropped furniture on that same foot. NAD noted.

## 2015-11-04 ENCOUNTER — Emergency Department (HOSPITAL_COMMUNITY)
Admission: EM | Admit: 2015-11-04 | Discharge: 2015-11-04 | Disposition: A | Discharge status: Home / Self Care | Payer: MEDICAID | Attending: Emergency Medicine | Admitting: Emergency Medicine

## 2015-11-04 ENCOUNTER — Encounter (HOSPITAL_COMMUNITY): Payer: Self-pay | Admitting: *Deleted

## 2015-11-04 DIAGNOSIS — J0191 Acute recurrent sinusitis, unspecified: Secondary | ICD-10-CM

## 2015-11-04 DIAGNOSIS — F17210 Nicotine dependence, cigarettes, uncomplicated: Secondary | ICD-10-CM | POA: Insufficient documentation

## 2015-11-04 MED ORDER — AMOXICILLIN-POT CLAVULANATE 875-125 MG PO TABS: 1.0000 | ORAL_TABLET | Freq: Two times a day (BID) | ORAL | Status: AC

## 2015-11-04 MED ORDER — LORATADINE 10 MG PO TABS
10.0000 mg | ORAL_TABLET | Freq: Every day | ORAL | Status: DC
  Administered 2015-11-04: 10 mg via ORAL
  Filled 2015-11-04: qty 1

## 2015-11-04 MED ORDER — AMOXICILLIN-POT CLAVULANATE 875-125 MG PO TABS
1.0000 | ORAL_TABLET | Freq: Once | ORAL | Status: AC
  Administered 2015-11-04: 1 via ORAL
  Filled 2015-11-04: qty 1

## 2015-11-04 MED ORDER — LORATADINE 10 MG PO TABS: 10.0000 mg | ORAL_TABLET | Freq: Every day | ORAL | Status: DC

## 2015-11-04 NOTE — ED Provider Notes (Signed)
CSN: [id number]     Arrival date & time 11/04/15  0935 History  By signing my name below, I, Jasmyn B. Alexander, attest that this documentation has been prepared under the direction and in the presence of Merrily Pew, MD.  Electronically Signed: Tedra Coupe. Sheppard Coil, ED Scribe. 11/04/2015. 11:45 AM.    Chief Complaint  Patient presents with  . Nasal Congestion    The history is provided by the patient. No language interpreter was used.   HPI Comments: Samantha [name] is a 40 y.o. female who presents to the Emergency Department complaining of gradual onset, constant, stabbing, bilateral ear pain x 1 week. Pt reports associated nasal congestion and productive cough with clear sputum. She reports taking Alka Seltzer with no relief. She does not take any allergy medicines or daily medications. She has not had any recent surgeries. Denies fever, nausea, and vomiting.  Past Medical History  Diagnosis Date  . Celiac disease   . Interstitial cystitis   . Arrhythmia   . DDD (degenerative disc disease)   . Degenerative disc disease, lumbar   . Kidney stones   . Chronic pain    Past Surgical History  Procedure Laterality Date  . Cesarean section    . Tonsillectomy    . Appendectomy    . Abdominal hysterectomy    . Hernia repair    . Adenoidectomy    . Kidney stone surgery     History reviewed. No pertinent family history. Social History  Substance Use Topics  . Smoking status: Current Every Day Smoker -- 0.50 packs/day    Types: Cigarettes  . Smokeless tobacco: None  . Alcohol Use: No   OB History    No data available     Review of Systems  Constitutional: Negative for fever.  HENT: Positive for congestion and ear pain.   Respiratory: Positive for cough.   Gastrointestinal: Negative for nausea and vomiting.  All other systems reviewed and are negative.   Allergies  Gluten and Tramadol  Home Medications   Prior to Admission medications   Medication Sig Start Date End  Date Taking? Authorizing Provider  acetaminophen (TYLENOL) 500 MG tablet Take 1,000 mg by mouth every 6 (six) hours as needed.    Historical Provider, MD  amoxicillin (AMOXIL) 500 MG capsule Take 1 capsule (500 mg total) by mouth 3 (three) times daily. 03/21/15   Lily Kocher, PA-C  amoxicillin-clavulanate (AUGMENTIN) 875-125 MG tablet Take 1 tablet by mouth 2 (two) times daily. 11/04/15 11/14/15  Merrily Pew, MD  diclofenac (VOLTAREN) 50 MG EC tablet Take 1 tablet (50 mg total) by mouth 2 (two) times daily. 08/12/15   Fransico Meadow, PA-C  HYDROcodone-acetaminophen (NORCO/VICODIN) 5-325 MG per tablet Take 1 tablet by mouth every 4 (four) hours as needed. 11/25/14   Evalee Jefferson, PA-C  ibuprofen (ADVIL,MOTRIN) 800 MG tablet Take 1 tablet (800 mg total) by mouth 3 (three) times daily. 05/23/15   Tammy Triplett, PA-C  loratadine (CLARITIN) 10 MG tablet Take 1 tablet (10 mg total) by mouth daily. 11/04/15   Merrily Pew, MD  ondansetron (ZOFRAN ODT) 4 MG disintegrating tablet Take 1 tablet (4 mg total) by mouth every 8 (eight) hours as needed for nausea or vomiting. 07/30/15   Fransico Meadow, PA-C  oxyCODONE-acetaminophen (PERCOCET/ROXICET) 5-325 MG tablet Take 1 tablet by mouth every 4 (four) hours as needed. 05/23/15   Tammy Triplett, PA-C  traMADol (ULTRAM) 50 MG tablet Take 1 tablet (50 mg total) by mouth every  6 (six) hours as needed. 11/20/14   Tammy Triplett, PA-C   BP 134/84 mmHg  Pulse 92  Temp(Src) 99 F (37.2 C) (Oral)  Ht 5\' 1"  (1.549 m)  Wt 170 lb (77.111 kg)  BMI 32.14 kg/m2  SpO2 100% Physical Exam  Constitutional: She is oriented to person, place, and time. She appears well-developed and well-nourished. No distress.  HENT:  Head: Normocephalic and atraumatic.  Mouth/Throat: Posterior oropharyngeal erythema present. No oropharyngeal exudate or posterior oropharyngeal edema.  Right and Left ear with middle ear effusion. No bulging or redness. Right sinus tender. Frontal sinuses tender.  Left sinus normal. No proptosis. Nose ring on left.  Eyes: Conjunctivae are normal.  Cardiovascular: Normal rate, regular rhythm and normal heart sounds.   Pulmonary/Chest: Effort normal and breath sounds normal.  Abdominal: She exhibits no distension.  Neurological: She is alert and oriented to person, place, and time.  Skin: Skin is warm and dry. No rash noted.  Psychiatric: She has a normal mood and affect.  Nursing note and vitals reviewed.   ED Course  Procedures (including critical care time) DIAGNOSTIC STUDIES: Oxygen Saturation is 100% on RA, normal by my interpretation.    COORDINATION OF CARE: 11:19 AM-Discussed treatment plan which includes Augmentin and Claritin prescription with pt at bedside and pt agreed to plan.   MDM   Final diagnoses:  Acute recurrent sinusitis, unspecified location    40 yo F w/ likely acute sinusitis over 7 days in length. Will start augmentin.  New Prescriptions: Discharge Medication List as of 11/04/2015 11:30 AM    START taking these medications   Details  amoxicillin-clavulanate (AUGMENTIN) 875-125 MG tablet Take 1 tablet by mouth 2 (two) times daily., Starting 11/04/2015, Until Wed 11/14/15, Print    loratadine (CLARITIN) 10 MG tablet Take 1 tablet (10 mg total) by mouth daily., Starting 11/04/2015, Until Discontinued, Print         I have personally and contemperaneously reviewed labs and imaging and used in my decision making as above.   A medical screening exam was performed and I feel the patient has had an appropriate workup for their chief complaint at this time and likelihood of emergent condition existing is low and thus workup can continue on an outpatient basis.. Their vital signs are stable. They have been counseled on decision, discharge, follow up and which symptoms necessitate immediate return to the emergency department.  They verbally stated understanding and agreement with plan and discharged in stable condition.   I  personally performed the services described in this documentation, which was scribed in my presence. The recorded information has been reviewed and is accurate.     Merrily Pew, MD 11/04/15 [phone]

## 2015-11-04 NOTE — ED Notes (Signed)
Pt. Reports nasal congestion, non productive congestive cough and bilateral ear pain.

## 2015-12-01 ENCOUNTER — Encounter (HOSPITAL_COMMUNITY): Payer: Self-pay | Admitting: *Deleted

## 2015-12-01 ENCOUNTER — Emergency Department (HOSPITAL_COMMUNITY)
Admission: EM | Admit: 2015-12-01 | Discharge: 2015-12-01 | Disposition: A | Discharge status: Home / Self Care | Payer: MEDICAID | Attending: Emergency Medicine | Admitting: Emergency Medicine

## 2015-12-01 DIAGNOSIS — K0889 Other specified disorders of teeth and supporting structures: Secondary | ICD-10-CM

## 2015-12-01 DIAGNOSIS — K029 Dental caries, unspecified: Secondary | ICD-10-CM | POA: Insufficient documentation

## 2015-12-01 DIAGNOSIS — F17210 Nicotine dependence, cigarettes, uncomplicated: Secondary | ICD-10-CM | POA: Insufficient documentation

## 2015-12-01 MED ORDER — CLINDAMYCIN HCL 150 MG PO CAPS: 300.0000 mg | ORAL_CAPSULE | Freq: Four times a day (QID) | ORAL | Status: DC

## 2015-12-01 MED ORDER — DICLOFENAC SODIUM 75 MG PO TBEC: 75.0000 mg | DELAYED_RELEASE_TABLET | Freq: Two times a day (BID) | ORAL | Status: DC

## 2015-12-01 NOTE — ED Notes (Signed)
Pt coms in for right sided dental pain starting 4 days ago. Pt states she had a filling fall out. Poor dental hygiene noted.

## 2015-12-01 NOTE — ED Provider Notes (Signed)
CSN: [id number]     Arrival date & time 12/01/15  1026 History   First MD Initiated Contact with Patient 12/01/15 1058     Chief Complaint  Patient presents with  . Dental Pain     (Consider location/radiation/quality/duration/timing/severity/associated sxs/prior Treatment) HPI   Samantha [name] is a 40 y.o. female who presents to the Emergency Department complaining of dental pain for 4 days.  She reports a throbbing pain to right jaw that radiates to her ear.  Associated with pain upon chewing and cold sensation.  She has tried OTC analgesics without relief.  She denies facial swelling, neck pain, fever or chills.   Past Medical History  Diagnosis Date  . Celiac disease   . Interstitial cystitis   . Arrhythmia   . DDD (degenerative disc disease)   . Degenerative disc disease, lumbar   . Kidney stones   . Chronic pain    Past Surgical History  Procedure Laterality Date  . Cesarean section    . Tonsillectomy    . Appendectomy    . Abdominal hysterectomy    . Hernia repair    . Adenoidectomy    . Kidney stone surgery     No family history on file. Social History  Substance Use Topics  . Smoking status: Current Every Day Smoker -- 0.50 packs/day    Types: Cigarettes  . Smokeless tobacco: None  . Alcohol Use: No   OB History    No data available     Review of Systems  Constitutional: Negative for fever and appetite change.  HENT: Positive for dental problem. Negative for congestion, facial swelling, sore throat and trouble swallowing.   Eyes: Negative for pain and visual disturbance.  Musculoskeletal: Negative for neck pain and neck stiffness.  Neurological: Negative for dizziness, facial asymmetry and headaches.  Hematological: Negative for adenopathy.  All other systems reviewed and are negative.     Allergies  Gluten and Tramadol  Home Medications   Prior to Admission medications   Medication Sig Start Date End Date Taking? Authorizing Provider   acetaminophen (TYLENOL) 500 MG tablet Take 1,000 mg by mouth every 6 (six) hours as needed.    Historical Provider, MD  amoxicillin (AMOXIL) 500 MG capsule Take 1 capsule (500 mg total) by mouth 3 (three) times daily. 03/21/15   Lily Kocher, PA-C  diclofenac (VOLTAREN) 50 MG EC tablet Take 1 tablet (50 mg total) by mouth 2 (two) times daily. 08/12/15   Fransico Meadow, PA-C  HYDROcodone-acetaminophen (NORCO/VICODIN) 5-325 MG per tablet Take 1 tablet by mouth every 4 (four) hours as needed. 11/25/14   Evalee Jefferson, PA-C  ibuprofen (ADVIL,MOTRIN) 800 MG tablet Take 1 tablet (800 mg total) by mouth 3 (three) times daily. 05/23/15   Westyn Keatley, PA-C  loratadine (CLARITIN) 10 MG tablet Take 1 tablet (10 mg total) by mouth daily. 11/04/15   Merrily Pew, MD  ondansetron (ZOFRAN ODT) 4 MG disintegrating tablet Take 1 tablet (4 mg total) by mouth every 8 (eight) hours as needed for nausea or vomiting. 07/30/15   Fransico Meadow, PA-C  oxyCODONE-acetaminophen (PERCOCET/ROXICET) 5-325 MG tablet Take 1 tablet by mouth every 4 (four) hours as needed. 05/23/15   Ruth Kovich, PA-C  traMADol (ULTRAM) 50 MG tablet Take 1 tablet (50 mg total) by mouth every 6 (six) hours as needed. 11/20/14   Akim Watkinson, PA-C   BP 119/78 mmHg  Pulse 61  Temp(Src) 98.3 F (36.8 C) (Oral)  Resp 16  Ht  5\' 1"  (1.549 m)  Wt 77.111 kg  BMI 32.14 kg/m2  SpO2 100% Physical Exam  Constitutional: She is oriented to person, place, and time. She appears well-developed and well-nourished. No distress.  HENT:  Head: Normocephalic and atraumatic.  Right Ear: Tympanic membrane and ear canal normal.  Left Ear: Tympanic membrane and ear canal normal.  Mouth/Throat: Uvula is midline, oropharynx is clear and moist and mucous membranes are normal. No trismus in the jaw. Dental caries present. No dental abscesses or uvula swelling.  Tenderness to palpation and dental caries of the right lower second molar.  partial avulsion of the tooth.  No  facial swelling, obvious dental abscess, trismus, or sublingual abnml.    Neck: Normal range of motion. Neck supple.  Cardiovascular: Normal rate, regular rhythm and normal heart sounds.   No murmur heard. Pulmonary/Chest: Effort normal and breath sounds normal.  Musculoskeletal: Normal range of motion.  Lymphadenopathy:    She has no cervical adenopathy.  Neurological: She is alert and oriented to person, place, and time. She exhibits normal muscle tone. Coordination normal.  Skin: Skin is warm and dry.  Nursing note and vitals reviewed.   ED Course  Procedures (including critical care time) Labs Review Labs Reviewed - No data to display  Imaging Review No results found. I have personally reviewed and evaluated these images and lab results as part of my medical decision-making.   EKG Interpretation None      MDM   Final diagnoses:  Pain, dental    Patient will appearing. Vital signs are stable. No concerning symptoms for dental abscess or Ludwig's angina.  Patient appears stable for discharge and agrees to follow-up with her dentist    Bufford Lope 12/03/15 2159  Nat Christen, MD 12/05/15 1753

## 2016-03-27 ENCOUNTER — Encounter (HOSPITAL_COMMUNITY): Payer: Self-pay | Admitting: Emergency Medicine

## 2016-03-27 ENCOUNTER — Emergency Department (HOSPITAL_COMMUNITY)
Admission: EM | Admit: 2016-03-27 | Discharge: 2016-03-27 | Disposition: A | Discharge status: Home / Self Care | Payer: Self-pay | Attending: Emergency Medicine | Admitting: Emergency Medicine

## 2016-03-27 DIAGNOSIS — J069 Acute upper respiratory infection, unspecified: Secondary | ICD-10-CM | POA: Insufficient documentation

## 2016-03-27 DIAGNOSIS — B9789 Other viral agents as the cause of diseases classified elsewhere: Secondary | ICD-10-CM

## 2016-03-27 DIAGNOSIS — Z79899 Other long term (current) drug therapy: Secondary | ICD-10-CM | POA: Insufficient documentation

## 2016-03-27 DIAGNOSIS — F17210 Nicotine dependence, cigarettes, uncomplicated: Secondary | ICD-10-CM | POA: Insufficient documentation

## 2016-03-27 LAB — RAPID STREP SCREEN (MED CTR MEBANE ONLY): STREPTOCOCCUS, GROUP A SCREEN (DIRECT): NEGATIVE

## 2016-03-27 MED ORDER — GUAIFENESIN-CODEINE 100-10 MG/5ML PO SYRP: 10.0000 mL | ORAL_SOLUTION | Freq: Three times a day (TID) | ORAL | 0 refills | Status: DC | PRN

## 2016-03-27 NOTE — ED Provider Notes (Signed)
[facility] DEPT Provider Note   CSN: [id number] Arrival date & time: 03/27/16  0909     History   Chief Complaint No chief complaint on file.   HPI Samantha [name] is a 40 y.o. female.  HPI   Samantha [name] is a 40 y.o. female who presents to the Emergency Department complaining of sore throat, nasal congestion, runny nose and cough for 3 days.  Her symptoms have been associated with subjective low grade fever at home, intermittent vomiting, and dizziness with position change.  She states that her symptoms have been worsening since onset.  She has taken tylenol without relief.  Cough is non-productive.  She reports recent exposure to strep throat.  She has been tolerating fluids without vomiting.  She denies diarrhea, chest pain, shortness of breath, dysuria.     Past Medical History:  Diagnosis Date  . Arrhythmia   . Celiac disease   . Chronic pain   . DDD (degenerative disc disease)   . Degenerative disc disease, lumbar   . Interstitial cystitis   . Kidney stones     Patient Active Problem List   Diagnosis Date Noted  . ANXIETY DISORDER 12/05/2008  . CELIAC DISEASE 12/05/2008  . OVARIAN CYST 12/05/2008  . LOW BACK PAIN, CHRONIC 12/05/2008  . ABDOMINAL PAIN, CHRONIC 12/05/2008  . SUPRAVENTRICULAR TACHYCARDIA, HX OF 12/05/2008    Past Surgical History:  Procedure Laterality Date  . ABDOMINAL HYSTERECTOMY    . ADENOIDECTOMY    . APPENDECTOMY    . CESAREAN SECTION    . HERNIA REPAIR    . KIDNEY STONE SURGERY    . TONSILLECTOMY      OB History    No data available       Home Medications    Prior to Admission medications   Medication Sig Start Date End Date Taking? Authorizing Provider  acetaminophen (TYLENOL) 500 MG tablet Take 1,000 mg by mouth every 6 (six) hours as needed.    Historical Provider, MD  amoxicillin (AMOXIL) 500 MG capsule Take 1 capsule (500 mg total) by mouth 3 (three) times daily. 03/21/15   Lily Kocher, PA-C  clindamycin (CLEOCIN)  150 MG capsule Take 2 capsules (300 mg total) by mouth 4 (four) times daily. For 7 days 12/01/15   Daesia Zylka, PA-C  diclofenac (VOLTAREN) 75 MG EC tablet Take 1 tablet (75 mg total) by mouth 2 (two) times daily. Take with food 12/01/15   Shawntell Dixson, PA-C  HYDROcodone-acetaminophen (NORCO/VICODIN) 5-325 MG per tablet Take 1 tablet by mouth every 4 (four) hours as needed. 11/25/14   Evalee Jefferson, PA-C  ibuprofen (ADVIL,MOTRIN) 800 MG tablet Take 1 tablet (800 mg total) by mouth 3 (three) times daily. 05/23/15   Tommie Dejoseph, PA-C  loratadine (CLARITIN) 10 MG tablet Take 1 tablet (10 mg total) by mouth daily. 11/04/15   Merrily Pew, MD  ondansetron (ZOFRAN ODT) 4 MG disintegrating tablet Take 1 tablet (4 mg total) by mouth every 8 (eight) hours as needed for nausea or vomiting. 07/30/15   Fransico Meadow, PA-C  oxyCODONE-acetaminophen (PERCOCET/ROXICET) 5-325 MG tablet Take 1 tablet by mouth every 4 (four) hours as needed. 05/23/15   Xochitl Egle, PA-C  traMADol (ULTRAM) 50 MG tablet Take 1 tablet (50 mg total) by mouth every 6 (six) hours as needed. 11/20/14   Sitara Cashwell, PA-C    Family History No family history on file.  Social History Social History  Substance Use Topics  . Smoking status: Current Every  Day Smoker    Packs/day: 0.50    Types: Cigarettes  . Smokeless tobacco: Not on file  . Alcohol use No     Allergies   Gluten and Tramadol   Review of Systems Review of Systems  Constitutional: Positive for appetite change, chills and fever. Negative for activity change.  HENT: Positive for congestion, rhinorrhea and sore throat. Negative for facial swelling and trouble swallowing.   Eyes: Negative for visual disturbance.  Respiratory: Positive for cough. Negative for shortness of breath, wheezing and stridor.   Cardiovascular: Negative for chest pain.  Gastrointestinal: Positive for vomiting. Negative for abdominal pain, diarrhea and nausea.  Genitourinary: Negative for  dysuria and flank pain.  Musculoskeletal: Positive for myalgias. Negative for neck pain and neck stiffness.  Skin: Negative.  Negative for rash.  Neurological: Negative for dizziness, weakness, numbness and headaches.  Hematological: Negative for adenopathy.  Psychiatric/Behavioral: Negative for confusion.  All other systems reviewed and are negative.    Physical Exam Updated Vital Signs There were no vitals taken for this visit.  Physical Exam  Constitutional: She is oriented to person, place, and time. She appears well-developed and well-nourished. No distress.  HENT:  Head: Atraumatic.  Right Ear: Tympanic membrane and ear canal normal.  Left Ear: Tympanic membrane and ear canal normal.  Mouth/Throat: Uvula is midline and mucous membranes are normal. No oral lesions. No uvula swelling. Posterior oropharyngeal erythema present. No oropharyngeal exudate, posterior oropharyngeal edema or tonsillar abscesses.  Eyes: Conjunctivae are normal.  Neck: Normal range of motion.  Cardiovascular: Normal rate, regular rhythm and normal heart sounds.   Pulmonary/Chest: Effort normal and breath sounds normal. No respiratory distress.  Abdominal: Soft. She exhibits no distension and no mass. There is no tenderness. There is no rebound.  Musculoskeletal: Normal range of motion.  Lymphadenopathy:    She has cervical adenopathy.  Neurological: She is alert and oriented to person, place, and time.  Skin: Skin is dry. No rash noted.  Psychiatric: She has a normal mood and affect.  Nursing note and vitals reviewed.    ED Treatments / Results  Labs (all labs ordered are listed, but only abnormal results are displayed) Labs Reviewed - No data to display  EKG  EKG Interpretation None       Radiology No results found.  Procedures Procedures (including critical care time)  Medications Ordered in ED Medications - No data to display   Initial Impression / Assessment and Plan / ED  Course  I have reviewed the triage vital signs and the nursing notes.  Pertinent labs & imaging results that were available during my care of the patient were reviewed by me and considered in my medical decision making (see chart for details).  Clinical Course    Patient well-appearing. Vital signs are stable. Afebrile here. Tolerating by mouth fluids, no vomiting in the department.  Strep screen negative. Symptoms are likely viral. Doubt influenza. Patient agrees to symptomatic treatment and PCP follow-up, return precautions given.  Final Clinical Impressions(s) / ED Diagnoses   Final diagnoses:  Viral URI with cough    New Prescriptions New Prescriptions   No medications on file     Kem Parkinson, PA-C 03/27/16 [place], MD 03/27/16 1601

## 2016-03-27 NOTE — ED Triage Notes (Signed)
Pt reports bodyaches, sore throat, runny nose, pressure behind eyes, low grade fever, cough and congestion x2 days.  Pt also reports dizziness.

## 2016-03-27 NOTE — ED Notes (Signed)
Given water for fluid challenge.   

## 2016-03-27 NOTE — Discharge Instructions (Signed)
Drink plenty of fluids. Tylenol or ibuprofen every 4-6 hours as needed for body aches or fever. Follow-up with your primary doctor if needed.

## 2016-03-30 LAB — CULTURE, GROUP A STREP (THRC)

## 2017-02-26 ENCOUNTER — Encounter (HOSPITAL_COMMUNITY): Payer: Self-pay | Admitting: Emergency Medicine

## 2017-02-26 ENCOUNTER — Emergency Department (HOSPITAL_COMMUNITY): Payer: Medicaid Other

## 2017-02-26 ENCOUNTER — Emergency Department (HOSPITAL_COMMUNITY)
Admission: EM | Admit: 2017-02-26 | Discharge: 2017-02-26 | Disposition: A | Discharge status: Home / Self Care | Payer: Medicaid Other | Attending: Emergency Medicine | Admitting: Emergency Medicine

## 2017-02-26 DIAGNOSIS — F17210 Nicotine dependence, cigarettes, uncomplicated: Secondary | ICD-10-CM | POA: Diagnosis not present

## 2017-02-26 DIAGNOSIS — R0602 Shortness of breath: Secondary | ICD-10-CM | POA: Diagnosis not present

## 2017-02-26 DIAGNOSIS — J029 Acute pharyngitis, unspecified: Secondary | ICD-10-CM | POA: Diagnosis not present

## 2017-02-26 DIAGNOSIS — J209 Acute bronchitis, unspecified: Secondary | ICD-10-CM | POA: Diagnosis not present

## 2017-02-26 DIAGNOSIS — R062 Wheezing: Secondary | ICD-10-CM | POA: Diagnosis not present

## 2017-02-26 DIAGNOSIS — R05 Cough: Secondary | ICD-10-CM | POA: Diagnosis present

## 2017-02-26 DIAGNOSIS — R0981 Nasal congestion: Secondary | ICD-10-CM | POA: Insufficient documentation

## 2017-02-26 MED ORDER — ALBUTEROL SULFATE HFA 108 (90 BASE) MCG/ACT IN AERS
2.0000 | INHALATION_SPRAY | Freq: Once | RESPIRATORY_TRACT | Status: AC
  Administered 2017-02-26: 2 via RESPIRATORY_TRACT
  Filled 2017-02-26: qty 6.7

## 2017-02-26 MED ORDER — ALBUTEROL SULFATE (2.5 MG/3ML) 0.083% IN NEBU
2.5000 mg | INHALATION_SOLUTION | Freq: Once | RESPIRATORY_TRACT | Status: AC
  Administered 2017-02-26: 2.5 mg via RESPIRATORY_TRACT
  Filled 2017-02-26: qty 3

## 2017-02-26 MED ORDER — ALBUTEROL (5 MG/ML) CONTINUOUS INHALATION SOLN
10.0000 mg/h | INHALATION_SOLUTION | Freq: Once | RESPIRATORY_TRACT | Status: AC
  Administered 2017-02-26: 10 mg/h via RESPIRATORY_TRACT
  Filled 2017-02-26: qty 20

## 2017-02-26 MED ORDER — PREDNISONE 50 MG PO TABS: 50.0000 mg | ORAL_TABLET | Freq: Every day | ORAL | 0 refills | Status: DC

## 2017-02-26 MED ORDER — BENZONATATE 100 MG PO CAPS
200.0000 mg | ORAL_CAPSULE | Freq: Three times a day (TID) | ORAL | 0 refills | Status: DC | PRN
Start: 2017-02-26 — End: 2019-05-17

## 2017-02-26 MED ORDER — PREDNISONE 50 MG PO TABS
60.0000 mg | ORAL_TABLET | Freq: Once | ORAL | Status: AC
  Administered 2017-02-26: 60 mg via ORAL
  Filled 2017-02-26: qty 1

## 2017-02-26 MED ORDER — IPRATROPIUM-ALBUTEROL 0.5-2.5 (3) MG/3ML IN SOLN
3.0000 mL | Freq: Once | RESPIRATORY_TRACT | Status: AC
  Administered 2017-02-26: 3 mL via RESPIRATORY_TRACT
  Filled 2017-02-26: qty 3

## 2017-02-26 NOTE — ED Notes (Signed)
Ambulated around nurses station o2 sats 98-100% on room air

## 2017-02-26 NOTE — ED Triage Notes (Signed)
Head and chest congestion, non productive cough

## 2017-02-26 NOTE — ED Provider Notes (Signed)
[facility] DEPT Provider Note   CSN: [id number] Arrival date & time: 02/26/17  0845     History   Chief Complaint Chief Complaint  Patient presents with  . Cough    HPI Samantha [name] is a 41 y.o. female with a past medical history as outlined below presenting with a one week history of uri type symptoms which includes nasal congestion  low grade fever, post nasal drip,shortness of breath with wheezing and nonproductive cough.  Symptoms do not include    Nausea, vomiting or diarrhea. She does endorse pain in her back and ribs with cough episodes The patient has taken dayquill and mucinex prior to arrival with no significant improvement in symptoms. She smokes 1/2 ppd, recently cut back from 1 ppd.  .  The history is provided by the patient.    Past Medical History:  Diagnosis Date  . Arrhythmia   . Celiac disease   . Chronic pain   . DDD (degenerative disc disease)   . Degenerative disc disease, lumbar   . Interstitial cystitis   . Kidney stones     Patient Active Problem List   Diagnosis Date Noted  . ANXIETY DISORDER 12/05/2008  . CELIAC DISEASE 12/05/2008  . OVARIAN CYST 12/05/2008  . LOW BACK PAIN, CHRONIC 12/05/2008  . ABDOMINAL PAIN, CHRONIC 12/05/2008  . SUPRAVENTRICULAR TACHYCARDIA, HX OF 12/05/2008    Past Surgical History:  Procedure Laterality Date  . ABDOMINAL HYSTERECTOMY    . ADENOIDECTOMY    . APPENDECTOMY    . CESAREAN SECTION    . HERNIA REPAIR    . KIDNEY STONE SURGERY    . TONSILLECTOMY      OB History    No data available       Home Medications    Prior to Admission medications   Medication Sig Start Date End Date Taking? Authorizing Provider  acetaminophen (TYLENOL) 500 MG tablet Take 1,000 mg by mouth every 6 (six) hours as needed.   Yes [provider]  diphenhydrAMINE (BENADRYL) 25 MG tablet Take 50-100 mg by mouth every 6 (six) hours as needed for allergies.   Yes [provider]  guaiFENesin (MUCINEX)  600 MG 12 hr tablet Take 600 mg by mouth 2 (two) times daily as needed for to loosen phlegm.   Yes [provider]  phenylephrine (SUDAFED PE) 10 MG TABS tablet Take 10 mg by mouth daily as needed (cold).   Yes [provider]  benzonatate (TESSALON) 100 MG capsule Take 2 capsules (200 mg total) by mouth 3 (three) times daily as needed. 02/26/17   Evalee Jefferson, PA-C  predniSONE (DELTASONE) 50 MG tablet Take 1 tablet (50 mg total) by mouth daily. 02/26/17   Evalee Jefferson, PA-C    Family History No family history on file.  Social History Social History  Substance Use Topics  . Smoking status: Current Every Day Smoker    Packs/day: 0.50    Types: Cigarettes  . Smokeless tobacco: Never Used  . Alcohol use No     Allergies   Gluten; Naproxen; and Tramadol   Review of Systems Review of Systems  Constitutional: Positive for fever. Negative for chills.  HENT: Positive for congestion and sore throat. Negative for ear pain, rhinorrhea, sinus pressure, trouble swallowing and voice change.   Eyes: Negative for discharge.  Respiratory: Positive for cough, shortness of breath and wheezing. Negative for stridor.   Cardiovascular: Negative for chest pain.  Gastrointestinal: Negative for abdominal pain.  Genitourinary: Negative.  Physical Exam Updated Vital Signs BP 129/88 (BP Location: Right Arm)   Pulse (!) 125   Temp 98.6 F (37 C) (Oral)   Resp 18   Ht 5\' 1"  (1.549 m)   Wt 77.1 kg (170 lb)   SpO2 99%   BMI 32.12 kg/m   Physical Exam  Constitutional: She is oriented to person, place, and time. She appears well-developed and well-nourished.  HENT:  Head: Normocephalic and atraumatic.  Right Ear: Tympanic membrane and ear canal normal.  Left Ear: Tympanic membrane and ear canal normal.  Nose: Mucosal edema and rhinorrhea present.  Mouth/Throat: Uvula is midline, oropharynx is clear and moist and mucous membranes are normal. No oropharyngeal exudate, posterior  oropharyngeal edema, posterior oropharyngeal erythema or tonsillar abscesses.  Eyes: Conjunctivae are normal.  Cardiovascular: Normal rate and normal heart sounds.   Pulmonary/Chest: Effort normal. No respiratory distress. She has wheezes. She has no rales.  Expiratory wheeze throughout.  Prolonged expirations.   Abdominal: Soft. There is no tenderness.  Musculoskeletal: Normal range of motion.  Neurological: She is alert and oriented to person, place, and time.  Skin: Skin is warm and dry. No rash noted.  Psychiatric: She has a normal mood and affect.     ED Treatments / Results  Labs (all labs ordered are listed, but only abnormal results are displayed) Labs Reviewed - No data to display  EKG  EKG Interpretation None       Radiology Dg Chest 2 View  Result Date: 02/26/2017 CLINICAL DATA:  41 year old female with a history of cough and congestion EXAM: CHEST  2 VIEW COMPARISON:  02/16/2014 FINDINGS: Cardiomediastinal silhouette unchanged in size and contour. No evidence of central vascular congestion, interlobular septal thickening. No pleural effusion or pneumothorax. No confluent airspace disease. No displaced fracture. IMPRESSION: No radiographic evidence of acute cardiopulmonary disease Electronically Signed   By: Corrie Mckusick D.O.   On: 02/26/2017 09:43    Procedures Procedures (including critical care time)  Medications Ordered in ED Medications  ipratropium-albuterol (DUONEB) 0.5-2.5 (3) MG/3ML nebulizer solution 3 mL (3 mLs Nebulization Given 02/26/17 1011)  albuterol (PROVENTIL) (2.5 MG/3ML) 0.083% nebulizer solution 2.5 mg (2.5 mg Nebulization Given 02/26/17 1011)  predniSONE (DELTASONE) tablet 60 mg (60 mg Oral Given 02/26/17 0955)  albuterol (PROVENTIL,VENTOLIN) solution continuous neb (10 mg/hr Nebulization Given 02/26/17 1041)  albuterol (PROVENTIL HFA;VENTOLIN HFA) 108 (90 Base) MCG/ACT inhaler 2 puff (2 puffs Inhalation Given 02/26/17 1227)     Initial  Impression / Assessment and Plan / ED Course  I have reviewed the triage vital signs and the nursing notes.  Pertinent labs & imaging results that were available during my care of the patient were reviewed by me and considered in my medical decision making (see chart for details).     Pt given duoneb tx plus prednisone 60 mg with no improvement in wheezing or sob.  Will repeat hour long neb tx. After hourlong neb tx, pt still with expiratory wheeze but improved with better aeration and no c/o sob.  She ambulated in dept with no decreased saturation or worsened cough or sob.  Prednisone pulse dosing prescribed, albuterol mdi with spacer given, tessalon.  Advised smoking cessation.  Strict return precautions discussed.  Final Clinical Impressions(s) / ED Diagnoses   Final diagnoses:  Acute bronchitis, unspecified organism    New Prescriptions Discharge Medication List as of 02/26/2017 12:17 PM    START taking these medications   Details  benzonatate (TESSALON) 100 MG capsule Take 2  capsules (200 mg total) by mouth 3 (three) times daily as needed., Starting Thu 02/26/2017, Print    predniSONE (DELTASONE) 50 MG tablet Take 1 tablet (50 mg total) by mouth daily., Starting Thu 02/26/2017, Print         Evalee Jefferson, PA-C 02/26/17 1718    Daleen Bo, MD 03/01/17 [phone]

## 2017-02-26 NOTE — Discharge Instructions (Signed)
Take your next dose of prednisone tomorrow morning.  Use the inhaler given, 2 puffs every 4 hours if you are coughing or wheezing.

## 2018-03-23 DIAGNOSIS — F329 Major depressive disorder, single episode, unspecified: Secondary | ICD-10-CM | POA: Diagnosis not present

## 2018-04-12 DIAGNOSIS — F329 Major depressive disorder, single episode, unspecified: Secondary | ICD-10-CM | POA: Diagnosis not present

## 2018-08-17 ENCOUNTER — Emergency Department (HOSPITAL_COMMUNITY)
Admission: EM | Admit: 2018-08-17 | Discharge: 2018-08-17 | Disposition: A | Discharge status: Home / Self Care | Payer: Medicaid Other | Attending: Emergency Medicine | Admitting: Emergency Medicine

## 2018-08-17 ENCOUNTER — Encounter (HOSPITAL_COMMUNITY): Payer: Self-pay | Admitting: Emergency Medicine

## 2018-08-17 ENCOUNTER — Other Ambulatory Visit: Payer: Self-pay

## 2018-08-17 DIAGNOSIS — Y939 Activity, unspecified: Secondary | ICD-10-CM | POA: Diagnosis not present

## 2018-08-17 DIAGNOSIS — Y929 Unspecified place or not applicable: Secondary | ICD-10-CM | POA: Diagnosis not present

## 2018-08-17 DIAGNOSIS — F17210 Nicotine dependence, cigarettes, uncomplicated: Secondary | ICD-10-CM | POA: Insufficient documentation

## 2018-08-17 DIAGNOSIS — S39012A Strain of muscle, fascia and tendon of lower back, initial encounter: Secondary | ICD-10-CM | POA: Diagnosis not present

## 2018-08-17 DIAGNOSIS — W06XXXA Fall from bed, initial encounter: Secondary | ICD-10-CM | POA: Diagnosis not present

## 2018-08-17 DIAGNOSIS — Y999 Unspecified external cause status: Secondary | ICD-10-CM | POA: Diagnosis not present

## 2018-08-17 DIAGNOSIS — S3992XA Unspecified injury of lower back, initial encounter: Secondary | ICD-10-CM | POA: Diagnosis present

## 2018-08-17 MED ORDER — DIAZEPAM 5 MG PO TABS: 5.0000 mg | ORAL_TABLET | Freq: Four times a day (QID) | ORAL | 0 refills | Status: DC | PRN

## 2018-08-17 NOTE — ED Provider Notes (Signed)
[facility] EMERGENCY DEPARTMENT Provider Note   CSN: [id number] Arrival date & time: 08/17/18  1431    History   Chief Complaint Chief Complaint  Patient presents with  . Back Pain    HPI Samantha [name] is a 43 y.o. female.     HPI   She presents for evaluation of low back pain starting 2 weeks ago with a "pop."  Aggravated last night when she "fell out of bed."  She denies radiating or radicular pain.  Pain is worse with movement.  She has history of degenerative joint disease and feels like she aggravated that.  She denies fever, chills, weight loss, dizziness, focal weakness or paresthesia.  There are no other known modifying factors.  Past Medical History:  Diagnosis Date  . Arrhythmia   . Celiac disease   . Chronic pain   . DDD (degenerative disc disease)   . Degenerative disc disease, lumbar   . Interstitial cystitis   . Kidney stones     Patient Active Problem List   Diagnosis Date Noted  . ANXIETY DISORDER 12/05/2008  . CELIAC DISEASE 12/05/2008  . OVARIAN CYST 12/05/2008  . LOW BACK PAIN, CHRONIC 12/05/2008  . ABDOMINAL PAIN, CHRONIC 12/05/2008  . SUPRAVENTRICULAR TACHYCARDIA, HX OF 12/05/2008    Past Surgical History:  Procedure Laterality Date  . ABDOMINAL HYSTERECTOMY    . ADENOIDECTOMY    . APPENDECTOMY    . CESAREAN SECTION    . HERNIA REPAIR    . KIDNEY STONE SURGERY    . TONSILLECTOMY       OB History   No obstetric history on file.      Home Medications    Prior to Admission medications   Medication Sig Start Date End Date Taking? Authorizing Provider  acetaminophen (TYLENOL) 500 MG tablet Take 1,000 mg by mouth every 6 (six) hours as needed.    [provider]  benzonatate (TESSALON) 100 MG capsule Take 2 capsules (200 mg total) by mouth 3 (three) times daily as needed. 02/26/17   Evalee Jefferson, PA-C  diazepam (VALIUM) 5 MG tablet Take 1 tablet (5 mg total) by mouth every 6 (six) hours as needed (muscle spasms). 08/17/18    Daleen Bo, MD  diphenhydrAMINE (BENADRYL) 25 MG tablet Take 50-100 mg by mouth every 6 (six) hours as needed for allergies.    [provider]  guaiFENesin (MUCINEX) 600 MG 12 hr tablet Take 600 mg by mouth 2 (two) times daily as needed for to loosen phlegm.    [provider]  phenylephrine (SUDAFED PE) 10 MG TABS tablet Take 10 mg by mouth daily as needed (cold).    [provider]  predniSONE (DELTASONE) 50 MG tablet Take 1 tablet (50 mg total) by mouth daily. 02/26/17   Evalee Jefferson, PA-C    Family History No family history on file.  Social History Social History   Tobacco Use  . Smoking status: Current Every Day Smoker    Packs/day: 0.50    Types: Cigarettes  . Smokeless tobacco: Never Used  Substance Use Topics  . Alcohol use: No  . Drug use: No    Types: Marijuana    Comment: last used x 1 1/2 years     Allergies   Gluten; Naproxen; and Tramadol   Review of Systems Review of Systems  All other systems reviewed and are negative.    Physical Exam Updated Vital Signs BP 140/86   Pulse 83   Temp 97.8 F (  36.6 C)   Resp 17   Ht 5\' 1"  (1.549 m)   Wt 80.7 kg   SpO2 100%   BMI 33.63 kg/m   Physical Exam Vitals signs and nursing note reviewed.  Constitutional:      General: She is not in acute distress.    Appearance: Normal appearance. She is well-developed. She is not ill-appearing or diaphoretic.  HENT:     Head: Normocephalic and atraumatic.     Right Ear: External ear normal.     Left Ear: External ear normal.     Mouth/Throat:     Mouth: Mucous membranes are moist.  Eyes:     Conjunctiva/sclera: Conjunctivae normal.     Pupils: Pupils are equal, round, and reactive to light.  Neck:     Musculoskeletal: Normal range of motion and neck supple.     Trachea: Phonation normal.  Cardiovascular:     Rate and Rhythm: Normal rate.  Pulmonary:     Effort: Pulmonary effort is normal.  Musculoskeletal:     Comments: Mild  tenderness midline upper lumbar, she resists extension of the back secondary to pain.  She ambulates with normal gait.  Skin:    General: Skin is warm and dry.  Neurological:     Mental Status: She is alert and oriented to person, place, and time.     Cranial Nerves: No cranial nerve deficit.     Sensory: No sensory deficit.     Motor: No abnormal muscle tone.     Coordination: Coordination normal.  Psychiatric:        Mood and Affect: Mood normal.        Behavior: Behavior normal.        Thought Content: Thought content normal.        Judgment: Judgment normal.      ED Treatments / Results  Labs (all labs ordered are listed, but only abnormal results are displayed) Labs Reviewed - No data to display  EKG None  Radiology No results found.  Procedures Procedures (including critical care time)  Medications Ordered in ED Medications - No data to display   Initial Impression / Assessment and Plan / ED Course  I have reviewed the triage vital signs and the nursing notes.  Pertinent labs & imaging results that were available during my care of the patient were reviewed by me and considered in my medical decision making (see chart for details).         Patient Vitals for the past 24 hrs:  BP Temp Pulse Resp SpO2 Height Weight  08/17/18 1438 140/86 97.8 F (36.6 C) 83 17 100 % 5\' 1"  (1.549 m) 80.7 kg    3:28 PM Reevaluation with update and discussion. After initial assessment and treatment, an updated evaluation reveals no change in status, findings discussed and questions answered. Daleen Bo   Medical Decision Making: Subacute back injury, worsened with mild fall.  Doubt fracture.  Doubt cancer.  Possible aggravation of preceding degenerative changes.  Suspect muscle spasm aggravated with contusion last night.  No decay further hospitalization at this time  CRITICAL CARE-no Performed by: Daleen Bo  Nursing Notes Reviewed/ Care Coordinated Applicable Imaging  Reviewed Interpretation of Laboratory Data incorporated into ED treatment  The patient appears reasonably screened and/or stabilized for discharge and I doubt any other medical condition or other [facility] requiring further screening, evaluation, or treatment in the ED at this time prior to discharge.  Plan: Home Medications-ibuprofen for pain; Home Treatments-rest, heat;  return here if the recommended treatment, does not improve the symptoms; Recommended follow up-[facility] follow-up if not better in 3 days.   Final Clinical Impressions(s) / ED Diagnoses   Final diagnoses:  Strain of lumbar region, initial encounter    ED Discharge Orders         Ordered    diazepam (VALIUM) 5 MG tablet  Every 6 hours PRN     08/17/18 1524           Daleen Bo, MD 08/17/18 1528

## 2018-08-17 NOTE — ED Triage Notes (Signed)
Pt c/o of lower back pain x 2 weeks with worsening pain last night when she fell out of bed

## 2018-08-17 NOTE — Discharge Instructions (Signed)
Use heat on the sore area 3 or 4 times a day.  For pain use ibuprofen 400 mg 3 times a day with meals.  Follow-up with your doctor if not better in 3 to 4 days.

## 2019-03-11 ENCOUNTER — Emergency Department (HOSPITAL_COMMUNITY): Payer: Medicaid Other

## 2019-03-11 ENCOUNTER — Encounter (HOSPITAL_COMMUNITY): Payer: Self-pay | Admitting: Emergency Medicine

## 2019-03-11 ENCOUNTER — Emergency Department (HOSPITAL_COMMUNITY)
Admission: EM | Admit: 2019-03-11 | Discharge: 2019-03-11 | Disposition: A | Discharge status: Home / Self Care | Payer: Medicaid Other | Attending: Emergency Medicine | Admitting: Emergency Medicine

## 2019-03-11 ENCOUNTER — Other Ambulatory Visit: Payer: Self-pay

## 2019-03-11 DIAGNOSIS — F17210 Nicotine dependence, cigarettes, uncomplicated: Secondary | ICD-10-CM | POA: Diagnosis not present

## 2019-03-11 DIAGNOSIS — G5762 Lesion of plantar nerve, left lower limb: Secondary | ICD-10-CM | POA: Insufficient documentation

## 2019-03-11 DIAGNOSIS — Z79899 Other long term (current) drug therapy: Secondary | ICD-10-CM | POA: Insufficient documentation

## 2019-03-11 DIAGNOSIS — M79672 Pain in left foot: Secondary | ICD-10-CM | POA: Diagnosis not present

## 2019-03-11 MED ORDER — IBUPROFEN 800 MG PO TABS: 800.0000 mg | ORAL_TABLET | Freq: Three times a day (TID) | ORAL | 0 refills | Status: DC

## 2019-03-11 NOTE — Discharge Instructions (Signed)
You have hard calluses on your feet.  The pain is probably from [facility] neuralgia.  Schedule to see the Podiatrist for evaluation

## 2019-03-11 NOTE — ED Provider Notes (Signed)
[facility] EMERGENCY DEPARTMENT Provider Note   CSN: [id number] Arrival date & time: 03/11/19  1744     History   Chief Complaint Chief Complaint  Patient presents with  . Foot Pain    HPI Samantha [name] is a 43 y.o. female.     The history is provided by the patient. No language interpreter was used.  Foot Pain This is a new problem. Episode onset: 4 months. The problem occurs constantly. The problem has been gradually worsening. Nothing aggravates the symptoms. Nothing relieves the symptoms. She has tried nothing for the symptoms. The treatment provided no relief.  Pt reports she has been having foot pain for the past 4 months.   Past Medical History:  Diagnosis Date  . Arrhythmia   . Celiac disease   . Chronic pain   . DDD (degenerative disc disease)   . Degenerative disc disease, lumbar   . Interstitial cystitis   . Kidney stones     Patient Active Problem List   Diagnosis Date Noted  . ANXIETY DISORDER 12/05/2008  . CELIAC DISEASE 12/05/2008  . OVARIAN CYST 12/05/2008  . LOW BACK PAIN, CHRONIC 12/05/2008  . ABDOMINAL PAIN, CHRONIC 12/05/2008  . SUPRAVENTRICULAR TACHYCARDIA, HX OF 12/05/2008    Past Surgical History:  Procedure Laterality Date  . ABDOMINAL HYSTERECTOMY    . ADENOIDECTOMY    . APPENDECTOMY    . CESAREAN SECTION    . HERNIA REPAIR    . KIDNEY STONE SURGERY    . TONSILLECTOMY       OB History   No obstetric history on file.      Home Medications    Prior to Admission medications   Medication Sig Start Date End Date Taking? Authorizing Provider  acetaminophen (TYLENOL) 500 MG tablet Take 1,000 mg by mouth every 6 (six) hours as needed.    [provider]  benzonatate (TESSALON) 100 MG capsule Take 2 capsules (200 mg total) by mouth 3 (three) times daily as needed. 02/26/17   Evalee Jefferson, PA-C  diazepam (VALIUM) 5 MG tablet Take 1 tablet (5 mg total) by mouth every 6 (six) hours as needed (muscle spasms). 08/17/18   Daleen Bo, MD  diphenhydrAMINE (BENADRYL) 25 MG tablet Take 50-100 mg by mouth every 6 (six) hours as needed for allergies.    [provider]  guaiFENesin (MUCINEX) 600 MG 12 hr tablet Take 600 mg by mouth 2 (two) times daily as needed for to loosen phlegm.    [provider]  ibuprofen (ADVIL) 800 MG tablet Take 1 tablet (800 mg total) by mouth 3 (three) times daily. 03/11/19   Fransico Meadow, PA-C  phenylephrine (SUDAFED PE) 10 MG TABS tablet Take 10 mg by mouth daily as needed (cold).    [provider]  predniSONE (DELTASONE) 50 MG tablet Take 1 tablet (50 mg total) by mouth daily. 02/26/17   Evalee Jefferson, PA-C    Family History History reviewed. No pertinent family history.  Social History Social History   Tobacco Use  . Smoking status: Current Every Day Smoker    Packs/day: 0.50    Types: Cigarettes  . Smokeless tobacco: Never Used  Substance Use Topics  . Alcohol use: No  . Drug use: No    Types: Marijuana    Comment: last used x 1 1/2 years     Allergies   Gluten, Naproxen, and Tramadol   Review of Systems Review of Systems  All other systems reviewed and  are negative.    Physical Exam Updated Vital Signs BP 115/66 (BP Location: Right Arm)   Pulse 63   Temp 98.3 F (36.8 C) (Oral)   Resp 16   Ht 5\' 1"  (1.549 m)   Wt 80.7 kg   SpO2 100%   BMI 33.63 kg/m   Physical Exam Vitals signs and nursing note reviewed.  Constitutional:      Appearance: She is well-developed.  HENT:     Head: Normocephalic.  Cardiovascular:     Rate and Rhythm: Normal rate.  Pulmonary:     Effort: Pulmonary effort is normal.  Abdominal:     General: There is no distension.  Musculoskeletal: Normal range of motion.     Comments: Left foot tender center of ball of foot,  Pain to palpation  From nv and ns intact    Skin:    General: Skin is warm.  Neurological:     General: No focal deficit present.     Mental Status: She is alert and oriented to  person, place, and time.  Psychiatric:        Mood and Affect: Mood normal.      ED Treatments / Results  Labs (all labs ordered are listed, but only abnormal results are displayed) Labs Reviewed - No data to display  EKG None  Radiology Dg Foot Complete Left  Result Date: 03/11/2019 CLINICAL DATA:  LEFT foot pain for 4 months.  Initial encounter. EXAM: LEFT FOOT - COMPLETE 3+ VIEW COMPARISON:  08/12/2015 FINDINGS: There is no evidence of fracture or dislocation. There is no evidence of arthropathy or other focal bone abnormality. Soft tissues are unremarkable. IMPRESSION: Negative. Electronically Signed   By: Margarette Canada M.D.   On: 03/11/2019 19:12    Procedures Procedures (including critical care time)  Medications Ordered in ED Medications - No data to display   Initial Impression / Assessment and Plan / ED Course  I have reviewed the triage vital signs and the nursing notes.  Pertinent labs & imaging results that were available during my care of the patient were reviewed by me and considered in my medical decision making (see chart for details).        MDM  Xray reviewed and discussed with pt.  Pt has hard calluses on the sole of her feet.  Tender area mid foot.  Pt counseled on calluses and possible Morton's neuroma.  Pt advised to follow up with Podiatrist for evaluation   Final Clinical Impressions(s) / ED Diagnoses   Final diagnoses:  Morton's neuroma of left foot    ED Discharge Orders         Ordered    ibuprofen (ADVIL) 800 MG tablet  3 times daily     03/11/19 1957        An After Visit Summary was printed and given to the patient.   Fransico Meadow, Hershal Coria 03/11/19 2046    Fredia Sorrow, MD 03/22/19 [phone]

## 2019-03-11 NOTE — ED Triage Notes (Signed)
PT c/o pain to left bottom of foot worsening over the past 4 months with hard area that is getting larger over time.

## 2019-04-09 ENCOUNTER — Emergency Department (HOSPITAL_COMMUNITY)
Admission: EM | Admit: 2019-04-09 | Discharge: 2019-04-09 | Disposition: A | Discharge status: Home / Self Care | Payer: Medicaid Other | Attending: Emergency Medicine | Admitting: Emergency Medicine

## 2019-04-09 ENCOUNTER — Other Ambulatory Visit: Payer: Self-pay

## 2019-04-09 ENCOUNTER — Encounter (HOSPITAL_COMMUNITY): Payer: Self-pay | Admitting: Emergency Medicine

## 2019-04-09 DIAGNOSIS — M79672 Pain in left foot: Secondary | ICD-10-CM | POA: Insufficient documentation

## 2019-04-09 DIAGNOSIS — Z79899 Other long term (current) drug therapy: Secondary | ICD-10-CM | POA: Diagnosis not present

## 2019-04-09 DIAGNOSIS — F17210 Nicotine dependence, cigarettes, uncomplicated: Secondary | ICD-10-CM | POA: Diagnosis not present

## 2019-04-09 MED ORDER — MELOXICAM 15 MG PO TBDP: 15.0000 mg | ORAL_TABLET | Freq: Every day | ORAL | 0 refills | Status: DC | PRN

## 2019-04-09 MED ORDER — KETOROLAC TROMETHAMINE 60 MG/2ML IM SOLN
15.0000 mg | Freq: Once | INTRAMUSCULAR | Status: AC
  Administered 2019-04-09: 15 mg via INTRAMUSCULAR
  Filled 2019-04-09: qty 2

## 2019-04-09 MED ORDER — OMEPRAZOLE 20 MG PO CPDR: 20.0000 mg | DELAYED_RELEASE_CAPSULE | Freq: Every day | ORAL | 0 refills | Status: DC

## 2019-04-09 NOTE — ED Notes (Addendum)
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

## 2019-04-09 NOTE — ED Notes (Signed)
Mother of patient call berating RN because "I was born in that hospital and my daughter is upset" that she is not having her foot cared for. Mother is informed that ED does not provide the service she was referred for, that she has been suggested to call medicaid for help with referral  Mother continues to shout that her daughter needs help and that this ED should provide  Call given to SP, PA due to her gentle nature and ability to reassure pt mother

## 2019-04-09 NOTE — ED Provider Notes (Signed)
[name] EMERG[facility]Y DEPARTMENT Provider Note   CSN: [id number] Arrival date & time: 04/09/19  1644     History   Chief Complaint Chief Complaint  Patient presents with  . Foot Pain    Samantha [name] is a 43 y.o. female with history of tobacco abuse, celiac disease, and chronic pain who presents to the emergency department with complaints of continued left foot pain. Patient has had an area of skin irritation/swelling to the plantar surface of her left forefoot region which has been present for about 6 months, worsening over the past 2 months. She states the area is constantly painful, severe, worse with weigth-bearing, no alleviating factors. Tried ibuprofen at home without relief. Seen in the ED for same about 1 month ago, had xray that was unremarkable, & was told to follow up with podiatry.  She states that she tried calling the podiatry office but was told that they do not take Medicaid.  Patient denies recent injury or change in activity.  Denies new footwear but she is on her feet excessively with her job. Denies fever, chills, redness, drainage, numbness, or weakness.     Samantha  Past Medical History:  Diagnosis Date  . Arrhythmia   . Celiac disease   . Chronic pain   . DDD (degenerative disc disease)   . Degenerative disc disease, lumbar   . Interstitial cystitis   . Kidney stones     Patient Active Problem List   Diagnosis Date Noted  . ANXIETY DISORDER 12/05/2008  . CELIAC DISEASE 12/05/2008  . OVARIAN CYST 12/05/2008  . LOW BACK PAIN, CHRONIC 12/05/2008  . ABDOMINAL PAIN, CHRONIC 12/05/2008  . SUPRAVENTRICULAR TACHYCARDIA, HX OF 12/05/2008    Past Surgical History:  Procedure Laterality Date  . ABDOMINAL HYSTERECTOMY    . ADENOIDECTOMY    . APPENDECTOMY    . CESAREAN SECTION    . HERNIA REPAIR    . KIDNEY STONE SURGERY    . TONSILLECTOMY       OB History   No obstetric history on file.      Home Medications    Prior to Admission medications    Medication Sig Start Date End Date Taking? Authorizing Provider  acetaminophen (TYLENOL) 500 MG tablet Take 1,000 mg by mouth every 6 (six) hours as needed.    [provider]  benzonatate (TESSALON) 100 MG capsule Take 2 capsules (200 mg total) by mouth 3 (three) times daily as needed. 02/26/17   Evalee Jefferson, PA-C  diazepam (VALIUM) 5 MG tablet Take 1 tablet (5 mg total) by mouth every 6 (six) hours as needed (muscle spasms). 08/17/18   Daleen Bo, MD  diphenhydrAMINE (BENADRYL) 25 MG tablet Take 50-100 mg by mouth every 6 (six) hours as needed for allergies.    [provider]  guaiFENesin (MUCINEX) 600 MG 12 hr tablet Take 600 mg by mouth 2 (two) times daily as needed for to loosen phlegm.    [provider]  ibuprofen (ADVIL) 800 MG tablet Take 1 tablet (800 mg total) by mouth 3 (three) times daily. 03/11/19   Fransico Meadow, PA-C  phenylephrine (SUDAFED PE) 10 MG TABS tablet Take 10 mg by mouth daily as needed (cold).    [provider]  predniSONE (DELTASONE) 50 MG tablet Take 1 tablet (50 mg total) by mouth daily. 02/26/17   Evalee Jefferson, PA-C    Family History No family history on file.  Social History Social History   Tobacco Use  .  Smoking status: Current Every Day Smoker    Packs/day: 0.50    Types: Cigarettes  . Smokeless tobacco: Never Used  Substance Use Topics  . Alcohol use: No  . Drug use: No    Types: Marijuana    Comment: last used x 1 1/2 years     Allergies   Gluten, Naproxen, and Tramadol   Review of Systems Review of Systems  Constitutional: Negative for chills and fever.  Gastrointestinal: Negative for vomiting.  Musculoskeletal: Positive for arthralgias and joint swelling.  Skin: Negative for color change and rash.  Allergic/Immunologic: Negative for immunocompromised state.  Neurological: Negative for weakness and numbness.    Physical Exam Updated Vital Signs BP 125/75 (BP Location: Right Arm)   Pulse 72    Temp 98.6 F (37 C) (Oral)   Resp 18   Ht 5\' 1"  (1.549 m)   Wt 80.7 kg   SpO2 100%   BMI 33.63 kg/m   Physical Exam Vitals signs and nursing note reviewed.  Constitutional:      General: She is not in acute distress.    Appearance: She is not ill-appearing or toxic-appearing.  HENT:     Head: Normocephalic and atraumatic.  Cardiovascular:     Pulses:          Dorsalis pedis pulses are 2+ on the right side and 2+ on the left side.       Posterior tibial pulses are 2+ on the right side and 2+ on the left side.  Pulmonary:     Effort: Pulmonary effort is normal.  Musculoskeletal:     Comments: Lower extremities: Please see picture below. There is what appears to be a callous to the plantar surface of the left lateral 1/2 of the forefoot that is very tender to palpation. There is an additional callous to the 1st MTP plantar region that is tender to palpation as well. No significant erythema, warmth, or purulent drainage. Tender to the diffuse forefoot & with metatarsal squeeze test. No other areas of tenderness. NVI distally.   Skin:    General: Skin is warm and dry.     Capillary Refill: Capillary refill takes less than 2 seconds.  Neurological:     Mental Status: She is alert.     Comments: Alert. Clear speech. Sensation grossly intact to bilateral lower extremities. 5/5 strength with plantar/dorsiflexion bilaterally. Patient ambulatory with antalgic gait.   Psychiatric:        Mood and Affect: Mood normal.        Behavior: Behavior normal.        ED Treatments / Results  Labs (all labs ordered are listed, but only abnormal results are displayed) Labs Reviewed - No data to display  EKG None  Radiology No results found.  Procedures Procedures (including critical care time)  Medications Ordered in ED Medications - No data to display   Initial Impression / Assessment and Plan / ED Course  I have reviewed the triage vital signs and the nursing notes.  Pertinent  labs & imaging results that were available during my care of the patient were reviewed by me and considered in my medical decision making (see chart for details).   Patient returns to the emergency department for worsening left foot pain which began about 6 months ago.  Patient nontoxic-appearing, vitals WNL.  Exam as pictured and described above, she appears to have 2 calluses to the left forefoot region which do not appear to have superimposed infection.  She  had a recent x-ray that did not show any significant abnormalities, given similar presentation @ that time without recent trauma do not feel this needs repeating. Feel she would benefit from podiatry follow up, will place ambulatory referral to assist with facilitating this, will also provide local orthopedics & podiatry follow up as well as medicaid contact information. IM toradol in the ER. Meloxicam prescription, has had some stomach upset with NSAIDs in the past therefore will also prescribe PPI. Discussed need for rest & comfortable footwear with RICE. I discussed  treatment plan, need for follow-up, and return precautions with the patient & her mother via telephone as her mother was initially upset regarding care. Provided opportunity for questions, patient & her mother confirmed understanding and are in agreement with plan.   Findings and plan of care discussed with supervising physician Dr. Roderic Palau who is in agreement.    Final Clinical Impressions(s) / ED Diagnoses   Final diagnoses:  Left foot pain    ED Discharge Orders         Ordered    Ambulatory referral to Podiatry     04/09/19 1745    Meloxicam 15 MG TBDP  Daily PRN     04/09/19 1747    omeprazole (PRILOSEC) 20 MG capsule  Daily     04/09/19 1747           Amaryllis Dyke, PA-C 04/09/19 1806    Milton Ferguson, MD 04/09/19 2257

## 2019-04-09 NOTE — Discharge Instructions (Addendum)
You were seen in the emergency department today for foot pain.  You were given a shot of anti-inflammatory in the emergency department, do not take any other anti-inflammatories or rest of the day today.  We are sending you home with a prescription strength anti-inflammatory, meloxicam.  - Meloxicam: this is a nonsteroidal anti-inflammatory medication that will help with pain and swelling. Be sure to take this medication as prescribed with food, 1 pill every 24 hours,  It should be taken with food, as it can cause stomach upset, and more seriously, stomach bleeding. Do not take other nonsteroidal anti-inflammatory medications with this such as Advil, Motrin, Aleve, Mobic, Goodie Powder, Naproxen, Ibuprofen, or Motrin.    - Omeprazole: This is a medicine to help somewhat coat your stomach to prevent irritation of the stomach lining related to the medicine above.  Please take this daily in the morning.  You make take Tylenol per over the counter dosing with these medications.   We have prescribed you new medication(s) today. Discuss the medications prescribed today with your pharmacist as they can have adverse effects and interactions with your other medicines including over the counter and prescribed medications. Seek medical evaluation if you start to experience new or abnormal symptoms after taking one of these medicines, seek care immediately if you start to experience difficulty breathing, feeling of your throat closing, facial swelling, or rash as these could be indications of a more serious allergic reaction  We have placed an ambulatory referral to the podiatry office in [place], they should be calling you to schedule an appointment.  We have also provided a podiatrist that is local in [place] as well as an orthopedic doctor who is local in [place], you may call each of these offices to try to schedule an appointment.  We would like you to have follow-up within the next 1 week.  We have also  copy and pasted below Medicaid podiatry information.  We have also circled in your discharge instructions for a number to call to help set up primary care in the area and listed local primary care providers.  Return to the ER for new or worsening symptoms or any other concerns.   Podiatry Services Podiatry services are medically necessary foot care including:  Cutting or removal of corns and calluses Trimming, cutting, clipping or debriding of nails Other hygienic care due to a physical or clinical finding that is consistent with a metabolic, neurological, and peripheral vascular disease diagnosis and indicative of severe peripheral involvement When these services are not medically necessary, they are considered routine and are not covered by the [facility] Medicaid program.  Prior Approval Podiatry and medically necessary routine foot care services do not require prior approval (PA) except for women enrolled in the Medicaid for Pregnant Women (MPW) program. The PA for MPW enrollees is required to document medical necessity for services related to pregnancy or due to complications of pregnancy. See Clinical Coverage Policy 1C-1 and 1C-2, Section 5.1.  To obtain prior approval for the above circumstances, call [employer] at [phone].  Fee Schedule  Clinical Coverage Policies (1C-1 and 1C-2)  Contact [place] Medicaid Clinical Section Phone: [phone] Fax: [phone]    [facility] Primary Care Doctor List    Sinda Du MD. Specialty: Pulmonary Disease Contact information: [place]  [place] [address]  [phone]   Tula Nakayama, MD. Specialty: [facility] Medicine Contact information: [address], [place]  [place] [address]  [phone]   Nicki Reaper  Wolfgang Phoenix, MD. Specialty: Family Medicine Contact information: [place]  [place] [address]  [phone]   Rosita Fire, MD Specialty: Internal Medicine Contact information: [place] [address]  [phone]   Delphina Cahill, MD. Specialty: Internal Medicine Contact information: [place] [address]  [phone]    [facility] (Dr. Maudie Mercury) Specialty: Family Medicine Contact information: [place] [address]  [phone]   Leslie Andrea, MD. Specialty: [facility] Medicine Contact information: [place] [address]  [phone]   Asencion Noble, MD. Specialty: Internal Medicine Contact information: [address]  [place] [address]  [place]  [address] [place], [place] [address] [phone]  Services [facility] offers a variety of basic health services.  Services include but are not limited to: Blood pressure checks  Heart rate checks  Blood sugar checks  Urine analysis  Rapid strep tests  Pregnancy tests.  Health education and referrals  People needing more complex services will be directed to a physician online. Using these virtual visits, doctors can evaluate and prescribe medicine and treatments. There will be no medication on-site, though [facility] will help patients fill their prescriptions at little to no cost.   For More information please go to: [email or web address]

## 2019-04-09 NOTE — ED Triage Notes (Signed)
LT foot pain on the bottom of her foot.  Pt seen here for same problem and referred to foot dr. Pt states no foot dr is taking medicaid at this time.

## 2019-04-09 NOTE — ED Notes (Signed)
Report to [place], RN who will DC patient

## 2019-04-11 ENCOUNTER — Telehealth: Payer: Self-pay | Admitting: Orthopedic Surgery

## 2019-04-11 NOTE — Telephone Encounter (Signed)
Dec 16th final answer

## 2019-04-11 NOTE — Telephone Encounter (Signed)
Patient called to relay she has gone back to [facility] Emergency room for a second time due to foot problem; per chart notes: Morton's neuroma. States was referred to podiatrist, Dr Berline Lopes, at time of first visit, and that Dr Berline Lopes does not accept her insurance, Medicaid.  States was referred to our clinic the second time, date of visit 04/09/19.  Please advise regarding visit here or if orthopaedic foot/ankle specialist.

## 2019-04-11 NOTE — Telephone Encounter (Signed)
Called back to patient; scheduled accordingly; reviewed insurance and primary care provider's office indicated in [facility] and Medicaid system, as patient had said she didn't have a primary care. Aware of appointment.

## 2019-04-13 ENCOUNTER — Other Ambulatory Visit: Payer: Self-pay

## 2019-04-13 ENCOUNTER — Ambulatory Visit (INDEPENDENT_AMBULATORY_CARE_PROVIDER_SITE_OTHER): Payer: Medicaid Other

## 2019-04-13 ENCOUNTER — Ambulatory Visit: Payer: Medicaid Other | Admitting: Podiatry

## 2019-04-13 DIAGNOSIS — M79672 Pain in left foot: Secondary | ICD-10-CM

## 2019-04-13 DIAGNOSIS — L84 Corns and callosities: Secondary | ICD-10-CM

## 2019-04-13 DIAGNOSIS — M7752 Other enthesopathy of left foot: Secondary | ICD-10-CM

## 2019-04-13 DIAGNOSIS — M779 Enthesopathy, unspecified: Secondary | ICD-10-CM

## 2019-04-13 DIAGNOSIS — M7989 Other specified soft tissue disorders: Secondary | ICD-10-CM

## 2019-04-13 DIAGNOSIS — M778 Other enthesopathies, not elsewhere classified: Secondary | ICD-10-CM

## 2019-04-13 MED ORDER — METHYLPREDNISOLONE 4 MG PO TBPK: ORAL_TABLET | ORAL | 0 refills | Status: DC

## 2019-04-14 ENCOUNTER — Encounter: Payer: Self-pay | Admitting: Podiatry

## 2019-04-14 ENCOUNTER — Other Ambulatory Visit: Payer: Self-pay | Admitting: Podiatry

## 2019-04-14 DIAGNOSIS — M779 Enthesopathy, unspecified: Secondary | ICD-10-CM

## 2019-04-14 DIAGNOSIS — M7989 Other specified soft tissue disorders: Secondary | ICD-10-CM

## 2019-04-14 NOTE — Progress Notes (Signed)
Subjective:  Patient ID: Samantha [name], female    DOB: 01/15/1976,  MRN: [id number]  Chief Complaint  Patient presents with  . Foot Pain    possible tumor located on the left foot, pt states that that the pain  is moving up to her left foot    43 y.o. female presents with the above complaint.  Patient presents today with concern for left foot possible soft tissue mass with a callus/hyperkeratotic lesion.  Patient states it hurts when she is ambulating especially left plantar forefoot.  She states is been going for about a 61-month.  Patient states that is worse when walking on it.  She states that there is a shooting sensation as well associated with it.  She was in the emergency room for this pain couple of days ago for which they recommended to follow-up with a podiatrist.  The x-rays were negative in the system.  However her pain has not resolved whatsoever.  She has tried over-the-counter medication such as ibuprofen and Tylenol which has not helped.   Review of Systems: Negative except as noted in the HPI. Denies N/V/F/Ch.  Past Medical History:  Diagnosis Date  . Arrhythmia   . Celiac disease   . Chronic pain   . DDD (degenerative disc disease)   . Degenerative disc disease, lumbar   . Interstitial cystitis   . Kidney stones     Current Outpatient Medications:  .  acetaminophen (TYLENOL) 500 MG tablet, Take 1,000 mg by mouth every 6 (six) hours as needed., Disp: , Rfl:  .  acetaminophen-codeine (TYLENOL #3) 300-30 MG tablet, Take 1 tablet by mouth every 6 (six) hours as needed., Disp: , Rfl:  .  amoxicillin (AMOXIL) 500 MG capsule, Take 500 mg by mouth 4 (four) times daily., Disp: , Rfl:  .  benzonatate (TESSALON) 100 MG capsule, Take 2 capsules (200 mg total) by mouth 3 (three) times daily as needed., Disp: 30 capsule, Rfl: 0 .  citalopram (CELEXA) 20 MG tablet, Take by mouth., Disp: , Rfl:  .  diazepam (VALIUM) 5 MG tablet, Take 1 tablet (5 mg total) by mouth every 6 (six)  hours as needed (muscle spasms)., Disp: 15 tablet, Rfl: 0 .  diphenhydrAMINE (BENADRYL) 25 MG tablet, Take 50-100 mg by mouth every 6 (six) hours as needed for allergies., Disp: , Rfl:  .  guaiFENesin (MUCINEX) 600 MG 12 hr tablet, Take 600 mg by mouth 2 (two) times daily as needed for to loosen phlegm., Disp: , Rfl:  .  HYDROcodone-acetaminophen (NORCO/VICODIN) 5-325 MG tablet, Take 1 tablet by mouth every 6 (six) hours as needed., Disp: , Rfl:  .  ibuprofen (ADVIL) 600 MG tablet, Take 600 mg by mouth every 6 (six) hours as needed., Disp: , Rfl:  .  ibuprofen (ADVIL) 800 MG tablet, Take 1 tablet (800 mg total) by mouth 3 (three) times daily., Disp: 21 tablet, Rfl: 0 .  meloxicam (MOBIC) 15 MG tablet, Take 15 mg by mouth daily as needed., Disp: , Rfl:  .  Meloxicam 15 MG TBDP, Take 15 mg by mouth daily as needed (pain)., Disp: 6 tablet, Rfl: 0 .  methylPREDNISolone (MEDROL DOSEPAK) 4 MG TBPK tablet, Use as directed, Disp: 1 each, Rfl: 0 .  omeprazole (PRILOSEC) 20 MG capsule, Take 1 capsule (20 mg total) by mouth daily., Disp: 7 capsule, Rfl: 0 .  phenylephrine (SUDAFED PE) 10 MG TABS tablet, Take 10 mg by mouth daily as needed (cold)., Disp: , Rfl:  .  predniSONE (DELTASONE) 50 MG tablet, Take 1 tablet (50 mg total) by mouth daily., Disp: 5 tablet, Rfl: 0  Social History   Tobacco Use  Smoking Status Current Every Day Smoker  . Packs/day: 0.50  . Types: Cigarettes  Smokeless Tobacco Never Used    Allergies  Allergen Reactions  . Gluten Other (See Comments)    Patient has condition that eliminates any Gluten intake  . Naproxen     abd pain    . Tramadol Nausea And Vomiting   Objective:  There were no vitals filed for this visit. There is no height or weight on file to calculate BMI. Constitutional Well developed. Well nourished.  Vascular Dorsalis pedis pulses palpable bilaterally. Posterior tibial pulses palpable bilaterally. Capillary refill normal to all digits.  No cyanosis  or clubbing noted. Pedal hair growth normal.  Neurologic Normal speech. Oriented to person, place, and time. Epicritic sensation to light touch grossly present bilaterally.  Dermatologic  hyperkeratotic lesion noted submet 3 on the left foot.  Upon palpation there is a soft tissue mass on the plantar aspect of the foot.  It appears to be mobile uniform not multilobulated well localized mass.  Orthopedic: Normal joint ROM without pain or crepitus bilaterally. No visible deformities. No bony tenderness.   Radiographs: There is no evidence of fracture or dislocation. There is no evidence of arthropathy or other focal bone abnormality. Soft tissues are unremarkable. Assessment:   1. Foot pain, left   2. Soft tissue mass   3. Plantar callus   4. Capsulitis of foot, left    Plan:  Patient was evaluated and treated and all questions answered.  Left submet 3 hyperkeratotic lesion with an associated soft tissue mass -Upon clinical palpating the mass I believe that there is something on the plantar aspect of the foot of unknown origin likely benign in nature given that it is well localized nonmultilobulated. -I believe patient will benefit from an MRI of the left foot to appropriately evaluate the extent and the nature of this mass. -I believe patient will also benefit from a steroid injection to the left plantar foot to allow for decrease of inflammation associated with the mass. -A steroid injection was performed at left plantar soft tissue mass using 1% plain Lidocaine and 10 mg of Kenalog. This was well tolerated. -Medrol Dosepak was dispensed.   No follow-ups on file.

## 2019-04-15 ENCOUNTER — Encounter: Payer: Self-pay | Admitting: Podiatry

## 2019-04-16 ENCOUNTER — Telehealth: Payer: Self-pay | Admitting: *Deleted

## 2019-04-16 NOTE — Telephone Encounter (Signed)
Patient called and left a message yesterday about a prescription and I tried to call the patient back today but the mail box is full. Samantha [name]

## 2019-05-11 ENCOUNTER — Ambulatory Visit: Payer: Medicaid Other | Admitting: Podiatry

## 2019-05-11 ENCOUNTER — Ambulatory Visit: Payer: Medicaid Other | Admitting: Orthopedic Surgery

## 2019-05-17 ENCOUNTER — Encounter: Payer: Self-pay | Admitting: Orthopaedic Surgery

## 2019-05-17 ENCOUNTER — Ambulatory Visit: Payer: Medicaid Other | Admitting: Orthopaedic Surgery

## 2019-05-17 ENCOUNTER — Other Ambulatory Visit: Payer: Self-pay

## 2019-05-17 VITALS — BP 110/74 | HR 81 | Ht 61.0 in | Wt 188.0 lb

## 2019-05-17 DIAGNOSIS — M79671 Pain in right foot: Secondary | ICD-10-CM | POA: Diagnosis not present

## 2019-05-17 DIAGNOSIS — M79672 Pain in left foot: Secondary | ICD-10-CM | POA: Diagnosis not present

## 2019-05-17 NOTE — Progress Notes (Signed)
Subjective:    Patient ID: Samantha [name], female    DOB: Apr 12, 1976, 43 y.o.   MRN: [id number]  HPI She has had pain in the left foot for several months.  On 03-11-2019 she went to the ER about the left foot pain.  They were concerned about a Morton's Neuroma.  She went back to the ER on 04-09-2019.  X-rays were negative.  She was then seen at [facility] on 04-13-2019.  A MRI was recommended but it had not been six weeks time since she was first seen in the ER and she was scheduled to return 05-11-2019. She did have an injection of the foot. She did not go to that appointment.  She has called to be seen here.  She has no new trauma. She is still having pain.    There is a concern of a small mass of the left foot.  MRI was recommended.    I will get MRI of the left foot with and without contrast.  I will then have her seen [facility] for review.  Patient is aware of this.     Review of Systems  Constitutional: Positive for activity change.  Musculoskeletal: Positive for gait problem and joint swelling.  Psychiatric/Behavioral: The patient is nervous/anxious.   All other systems reviewed and are negative.  For Review of Systems, all other systems reviewed and are negative.  The following is a summary of the past history medically, past history surgically, known current medicines, social history and family history.  This information is gathered electronically by the computer from prior information and documentation.  I review this each visit and have found including this information at this point in the chart is beneficial and informative.   Past Medical History:  Diagnosis Date  . Arrhythmia   . Celiac disease   . Chronic pain   . DDD (degenerative disc disease)   . Degenerative disc disease, lumbar   . Interstitial cystitis   . Kidney stones     Past Surgical History:  Procedure Laterality Date  . ABDOMINAL HYSTERECTOMY    . ADENOIDECTOMY    .  APPENDECTOMY    . CESAREAN SECTION    . HERNIA REPAIR    . KIDNEY STONE SURGERY    . TONSILLECTOMY      Current Outpatient Medications on File Prior to Visit  Medication Sig Dispense Refill  . acetaminophen (TYLENOL) 500 MG tablet Take 1,000 mg by mouth every 6 (six) hours as needed.    . citalopram (CELEXA) 20 MG tablet Take by mouth.    Marland Kitchen ibuprofen (ADVIL) 800 MG tablet Take 1 tablet (800 mg total) by mouth 3 (three) times daily. 21 tablet 0  . meloxicam (MOBIC) 15 MG tablet Take 15 mg by mouth daily as needed.    Marland Kitchen HYDROcodone-acetaminophen (NORCO/VICODIN) 5-325 MG tablet Take 1 tablet by mouth every 6 (six) hours as needed.    . Meloxicam 15 MG TBDP Take 15 mg by mouth daily as needed (pain). (Patient not taking: Reported on 05/17/2019) 6 tablet 0   No current facility-administered medications on file prior to visit.    Social History   Socioeconomic History  . Marital status: Divorced    Spouse name: Not on file  . Number of children: Not on file  . Years of education: Not on file  . Highest education level: Not on file  Occupational History  . Not on file  Tobacco  Use  . Smoking status: Current Every Day Smoker    Packs/day: 0.50    Types: Cigarettes  . Smokeless tobacco: Never Used  Substance and Sexual Activity  . Alcohol use: No  . Drug use: No    Types: Marijuana    Comment: last used x 1 1/2 years  . Sexual activity: Yes    Birth control/protection: Surgical  Other Topics Concern  . Not on file  Social History Narrative  . Not on file   Social Determinants of Health   Financial Resource Strain:   . Difficulty of Paying Living Expenses: Not on file  Food Insecurity:   . Worried About Charity fundraiser in the Last Year: Not on file  . Ran Out of Food in the Last Year: Not on file  Transportation Needs:   . Lack of Transportation (Medical): Not on file  . Lack of Transportation (Non-Medical): Not on file  Physical Activity:   . Days of Exercise per  Week: Not on file  . Minutes of Exercise per Session: Not on file  Stress:   . Feeling of Stress : Not on file  Social Connections:   . Frequency of Communication with Friends and Family: Not on file  . Frequency of Social Gatherings with Friends and Family: Not on file  . Attends Religious Services: Not on file  . Active Member of Clubs or Organizations: Not on file  . Attends Archivist Meetings: Not on file  . Marital Status: Not on file  Intimate Partner Violence:   . Fear of Current or Ex-Partner: Not on file  . Emotionally Abused: Not on file  . Physically Abused: Not on file  . Sexually Abused: Not on file    Family History  Problem Relation Age of Onset  . Cancer Mother   . Heart disease Mother     BP 110/74   Pulse 81   Ht 5\' 1"  (1.549 m)   Wt 188 lb (85.3 kg)   BMI 35.52 kg/m   Body mass index is 35.52 kg/m.     Objective:   Physical Exam Vitals reviewed.  Constitutional:      Appearance: She is well-developed.  HENT:     Head: Normocephalic and atraumatic.  Eyes:     Conjunctiva/sclera: Conjunctivae normal.     Pupils: Pupils are equal, round, and reactive to light.  Cardiovascular:     Rate and Rhythm: Normal rate and regular rhythm.  Pulmonary:     Effort: Pulmonary effort is normal.  Abdominal:     Palpations: Abdomen is soft.  Musculoskeletal:     Cervical back: Normal range of motion and neck supple.     Left foot: Swelling, deformity and tenderness present.     Comments: There is a left third metatarsal head area hyperkeratotic lesion with tenderness on the plantar surface of the foot.  There appears to be a mass also in this area just under the swelling here.  She has callus of the heels.  NV intact.  ROM of the ankle is full.    Skin:    General: Skin is warm and dry.  Neurological:     Mental Status: She is alert and oriented to person, place, and time.     Cranial Nerves: No cranial nerve deficit.     Motor: No abnormal muscle  tone.     Coordination: Coordination normal.     Deep Tendon Reflexes: Reflexes are normal and symmetric. Reflexes normal.  Psychiatric:        Behavior: Behavior normal.        Thought Content: Thought content normal.        Judgment: Judgment normal.     I have reviewed the ER records, two of them on dates as stated above and the records from [facility].      Assessment & Plan:   Encounter Diagnoses  Name Primary?  . Pain in left foot Yes  . Pain in right foot    Get the MRI.  [facility] to [facility].  Call if any problem.  Precautions discussed.   Electronically Signed Sanjuana Kava, MD 12/22/20203:31 PM

## 2019-05-26 ENCOUNTER — Ambulatory Visit (HOSPITAL_COMMUNITY): Admission: RE | Admit: 2019-05-26 | Payer: Medicaid Other | Source: Ambulatory Visit

## 2019-06-21 ENCOUNTER — Other Ambulatory Visit: Payer: Self-pay

## 2019-06-21 ENCOUNTER — Ambulatory Visit (HOSPITAL_COMMUNITY)
Admission: RE | Admit: 2019-06-21 | Discharge: 2019-06-21 | Disposition: A | Discharge status: Home / Self Care | Payer: Medicaid Other | Source: Ambulatory Visit | Attending: Orthopaedic Surgery | Admitting: Orthopaedic Surgery

## 2019-06-21 DIAGNOSIS — M79672 Pain in left foot: Secondary | ICD-10-CM | POA: Diagnosis not present

## 2019-06-21 DIAGNOSIS — R2242 Localized swelling, mass and lump, left lower limb: Secondary | ICD-10-CM | POA: Diagnosis not present

## 2019-06-21 MED ORDER — GADOBUTROL 1 MMOL/ML IV SOLN
7.0000 mL | Freq: Once | INTRAVENOUS | Status: AC | PRN
  Administered 2019-06-21: 7 mL via INTRAVENOUS

## 2019-07-01 ENCOUNTER — Ambulatory Visit: Payer: Medicaid Other | Admitting: Podiatry

## 2019-07-01 ENCOUNTER — Other Ambulatory Visit: Payer: Self-pay

## 2019-07-01 ENCOUNTER — Telehealth: Payer: Self-pay | Admitting: Podiatry

## 2019-07-01 DIAGNOSIS — M2042 Other hammer toe(s) (acquired), left foot: Secondary | ICD-10-CM | POA: Diagnosis not present

## 2019-07-01 DIAGNOSIS — Z01818 Encounter for other preprocedural examination: Secondary | ICD-10-CM | POA: Diagnosis not present

## 2019-07-01 DIAGNOSIS — M79672 Pain in left foot: Secondary | ICD-10-CM

## 2019-07-01 DIAGNOSIS — M7989 Other specified soft tissue disorders: Secondary | ICD-10-CM

## 2019-07-01 NOTE — Patient Instructions (Signed)
Pre-Operative Instructions  Congratulations, you have decided to take an important step towards improving your quality of life.  You can be assured that the doctors and staff at [facility] will be with you every step of the way.  Here are some important things you should know:  1. Plan to be at the surgery center/hospital at least 1 (one) hour prior to your scheduled time, unless otherwise directed by the surgical center/hospital staff.  You must have a responsible adult accompany you, remain during the surgery and drive you home.  Make sure you have directions to the surgical center/hospital to ensure you arrive on time. 2. If you are having surgery at [facility], you will need a copy of your medical history and physical form from your family physician within one month prior to the date of surgery. We will give you a form for your primary physician to complete.  3. We make every effort to accommodate the date you request for surgery.  However, there are times where surgery dates or times have to be moved.  We will contact you as soon as possible if a change in schedule is required.   4. No aspirin/ibuprofen for one week before surgery.  If you are on aspirin, any non-steroidal anti-inflammatory medications (Mobic, Aleve, Ibuprofen) should not be taken seven (7) days prior to your surgery.  You make take Tylenol for pain prior to surgery.  5. Medications - If you are taking daily heart and blood pressure medications, seizure, reflux, allergy, asthma, anxiety, pain or diabetes medications, make sure you notify the surgery center/hospital before the day of surgery so they can tell you which medications you should take or avoid the day of surgery. 6. No food or drink after midnight the night before surgery unless directed otherwise by surgical center/hospital staff. 7. No alcoholic beverages 24-hours prior to surgery.  No smoking 24-hours prior or 24-hours after  surgery. 8. Wear loose pants or shorts. They should be loose enough to fit over bandages, boots, and casts. 9. Don't wear slip-on shoes. Sneakers are preferred. 10. Bring your boot with you to the surgery center/hospital.  Also bring crutches or a walker if your physician has prescribed it for you.  If you do not have this equipment, it will be provided for you after surgery. 11. If you have not been contacted by the surgery center/hospital by the day before your surgery, call to confirm the date and time of your surgery. 12. Leave-time from work may vary depending on the type of surgery you have.  Appropriate arrangements should be made prior to surgery with your employer. 13. Prescriptions will be provided immediately following surgery by your doctor.  Fill these as soon as possible after surgery and take the medication as directed. Pain medications will not be refilled on weekends and must be approved by the doctor. 14. Remove nail polish on the operative foot and avoid getting pedicures prior to surgery. 15. Wash the night before surgery.  The night before surgery wash the foot and leg well with water and the antibacterial soap provided. Be sure to pay special attention to beneath the toenails and in between the toes.  Wash for at least three (3) minutes. Rinse thoroughly with water and dry well with a towel.  Perform this wash unless told not to do so by your physician.  Enclosed: 1 Ice pack (please put in freezer the night before surgery)   1 Hibiclens skin cleaner     Pre-op instructions  If you have any questions regarding the instructions, please do not hesitate to call our office.  [place]: [address], , [place] [address] -- [phone]  [place]: [address] [place], [place] [address] -- [phone]  [place]: [address], [place], [place] [address] -- [phone]   Website: [email or web address] 

## 2019-07-01 NOTE — Telephone Encounter (Signed)
Called pt to let her know someone from the surgical center would give her a call a day or two prior to her surgery to let her know what time to arrive. Told her it depends on if they have any schedule changes or patients that are pediatric or diabetic that need to go back first. Told pt to call us if she was to have any other questions.

## 2019-07-01 NOTE — Telephone Encounter (Signed)
I had appointment there today and was scheduled for surgery on 07/11/2019. I'm calling you I guess to get the time, so if you could call me back please. Thanks.

## 2019-07-05 ENCOUNTER — Encounter: Payer: Self-pay | Admitting: Podiatry

## 2019-07-05 NOTE — Progress Notes (Signed)
Subjective:  Patient ID: Samantha [name], female    DOB: Oct 16, 1975,  MRN: [id number]  Chief Complaint  Patient presents with  . Foot Pain    pt is here for to talk about getting Mri results pt states that she is still in pain, foot pain is elevated to the touch    44 y.o. female presents with the above complaint.  Patient presents with a follow-up of MRI result to the left plantar aspect of the foot soft tissue mass with secondary complaint of hammertoe contractures second through fourth digit.  Patient states that the pain is still very painful especially in the bottom of the foot.  She states that the injections did not help she states that offloading it did not help as well.  She has tried everything to try to decrease his pain.  She also states that she has pain from hammertoe contractures as well she has tried offloading she has tried taping padding protectors however nothing has helped.  At this time she has failed all conservative therapy and would like to know if there is a surgical intervention to help alleviate the pain to the left lower extremity.   Review of Systems: Negative except as noted in the HPI. Denies N/V/F/Ch.  Past Medical History:  Diagnosis Date  . Arrhythmia   . Celiac disease   . Chronic pain   . DDD (degenerative disc disease)   . Degenerative disc disease, lumbar   . Interstitial cystitis   . Kidney stones     Current Outpatient Medications:  .  acetaminophen (TYLENOL) 500 MG tablet, Take 1,000 mg by mouth every 6 (six) hours as needed., Disp: , Rfl:  .  citalopram (CELEXA) 20 MG tablet, Take by mouth., Disp: , Rfl:  .  HYDROcodone-acetaminophen (NORCO/VICODIN) 5-325 MG tablet, Take 1 tablet by mouth every 6 (six) hours as needed., Disp: , Rfl:  .  ibuprofen (ADVIL) 800 MG tablet, Take 1 tablet (800 mg total) by mouth 3 (three) times daily., Disp: 21 tablet, Rfl: 0 .  meloxicam (MOBIC) 15 MG tablet, Take 15 mg by mouth daily as needed., Disp: , Rfl:  .   Meloxicam 15 MG TBDP, Take 15 mg by mouth daily as needed (pain)., Disp: 6 tablet, Rfl: 0  Social History   Tobacco Use  Smoking Status Current Every Day Smoker  . Packs/day: 0.50  . Types: Cigarettes  Smokeless Tobacco Never Used    Allergies  Allergen Reactions  . Gluten Other (See Comments)    Patient has condition that eliminates any Gluten intake  . Naproxen     abd pain    . Tramadol Nausea And Vomiting   Objective:  There were no vitals filed for this visit. There is no height or weight on file to calculate BMI. Constitutional Well developed. Well nourished.  Vascular Dorsalis pedis pulses palpable bilaterally. Posterior tibial pulses palpable bilaterally. Capillary refill normal to all digits.  No cyanosis or clubbing noted. Pedal hair growth normal.  Neurologic Normal speech. Oriented to person, place, and time. Epicritic sensation to light touch grossly present bilaterally.  Dermatologic  hyperkeratotic lesion noted submet 3 on the left foot.  Upon palpation there is a soft tissue mass on the plantar aspect of the foot.  It appears to be mobile uniform not multilobulated well localized mass.  Orthopedic: Normal joint ROM without pain or crepitus bilaterally. No visible deformities. No bony tenderness.  Hammertoe contractures of digits 2 3 and 4 semiflexible/reducible in nature.  There is MPJ contractures of each respective digits second through fourth.   Radiographs: There is no evidence of fracture or dislocation. There is no evidence of arthropathy or other focal bone abnormality. Soft tissues are unremarkable.  Hammertoe contractures noted of digits 234.  Metatarsal parabola is intact.  1. Focal poorly defined enhancing nodule at the plantar aspect of the third MTP joint superficial to the flexor tendon. The appearance is nonspecific. 2. No typical Morton's neuroma.  Assessment:   1. Soft tissue mass   2. Foot pain, left   3. Hammertoe of left foot     4. Preop examination    Plan:  Patient was evaluated and treated and all questions answered.  Left submetatarsal 3 soft tissue mass with a possible plantar plate injury -Upon clinical palpating the mass I believe that there is something on the plantar aspect of the foot of unknown origin likely benign in nature given that it is well localized nonmultilobulated. -MRI was reviewed with the patient and my findings were discussed with the patient in great detail.  There appears to be a soft tissue mass localized to the plantar aspect of the flexor sheath as well as near the plantar plate.  I am not sure if this is a plantar plate rupture as opposed to a soft tissue mass.  However patient will benefit from excision of the soft tissue mass versus plantar plate repair.  I plan on making a plantar incision and spoke to patient in great detail the importance of not putting any weight on the plantar aspect of the foot given the incision placement.  Patient states understanding and will not be placing any weight to the foot.   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  Given that patient has failed all conservative therapy, she would like a surgical intervention to help correct the hammertoe contractures.  I explained to the patient that my goal is to reduce pain by correcting the alignment of the hammertoes.  Patient states understanding and would like to pursue surgical intervention.  I plan on performing a left second through fourth digit arthroplasty with capsulotomy of the joint with pin fixation possible. -I will plan on placing the patient back in nonweightbearing with a cam boot that I will dispense at the surgery center -Informed surgical risk consent was reviewed and read aloud to the patient.  I reviewed the films.  I have discussed my  findings with the patient in great detail.  I have discussed all risks including but not limited to infection, stiffness, scarring, limp, disability, deformity, damage to blood vessels and nerves, numbness, poor healing, need for braces, arthritis, chronic pain, amputation, death.  All benefits and realistic expectations discussed in great detail.  I have made no promises as to the outcome.  I have provided realistic expectations.  I have offered the patient a 2nd opinion, which they have declined and assured me they preferred to proceed despite the risks    No follow-ups on file.

## 2019-07-11 ENCOUNTER — Emergency Department (HOSPITAL_COMMUNITY)
Admission: EM | Admit: 2019-07-11 | Discharge: 2019-07-11 | Disposition: A | Discharge status: Home / Self Care | Payer: Medicaid Other | Attending: Emergency Medicine | Admitting: Emergency Medicine

## 2019-07-11 ENCOUNTER — Other Ambulatory Visit: Payer: Self-pay

## 2019-07-11 ENCOUNTER — Encounter (HOSPITAL_COMMUNITY): Payer: Self-pay | Admitting: Emergency Medicine

## 2019-07-11 ENCOUNTER — Emergency Department (HOSPITAL_COMMUNITY): Payer: Medicaid Other

## 2019-07-11 DIAGNOSIS — M25462 Effusion, left knee: Secondary | ICD-10-CM | POA: Diagnosis not present

## 2019-07-11 DIAGNOSIS — R52 Pain, unspecified: Secondary | ICD-10-CM | POA: Diagnosis not present

## 2019-07-11 DIAGNOSIS — F17210 Nicotine dependence, cigarettes, uncomplicated: Secondary | ICD-10-CM | POA: Diagnosis not present

## 2019-07-11 DIAGNOSIS — Z79899 Other long term (current) drug therapy: Secondary | ICD-10-CM | POA: Diagnosis not present

## 2019-07-11 DIAGNOSIS — F1210 Cannabis abuse, uncomplicated: Secondary | ICD-10-CM | POA: Diagnosis not present

## 2019-07-11 DIAGNOSIS — M25562 Pain in left knee: Secondary | ICD-10-CM | POA: Diagnosis present

## 2019-07-11 DIAGNOSIS — M79662 Pain in left lower leg: Secondary | ICD-10-CM | POA: Diagnosis not present

## 2019-07-11 MED ORDER — HYDROCODONE-ACETAMINOPHEN 5-325 MG PO TABS: 1.0000 | ORAL_TABLET | ORAL | 0 refills | Status: DC | PRN

## 2019-07-11 NOTE — Discharge Instructions (Addendum)
You may use the pain medicine given for the next 1-2 days for acute pain relief. This will make you drowsy - do not drive within 4 hours of taking this medicine.  I also recommend you continue taking ibuprofen 600 mg (3 tablets) every 8 hours and tylenol 650 mg also every 8 hours (no prescription needed for these medicines).  Apply ice as much as possible for the next several days.  Your xrays are negative for acute bony injury, but you do have an effusion (collection of fluid in the knee joint space) suggestion a soft tissue injury (ligament). Wear the splint for comfort.
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
## 2019-07-11 NOTE — ED Notes (Signed)
[place] [employer] at bedside.

## 2019-07-12 MED FILL — Hydrocodone-Acetaminophen Tab 5-325 MG: ORAL | Qty: 6 | Status: AC

## 2019-07-18 DIAGNOSIS — M2042 Other hammer toe(s) (acquired), left foot: Secondary | ICD-10-CM | POA: Diagnosis not present

## 2019-07-18 DIAGNOSIS — D492 Neoplasm of unspecified behavior of bone, soft tissue, and skin: Secondary | ICD-10-CM | POA: Diagnosis not present

## 2019-07-18 DIAGNOSIS — D2122 Benign neoplasm of connective and other soft tissue of left lower limb, including hip: Secondary | ICD-10-CM | POA: Diagnosis not present

## 2019-07-18 DIAGNOSIS — M24575 Contracture, left foot: Secondary | ICD-10-CM | POA: Diagnosis not present

## 2019-07-18 DIAGNOSIS — M25572 Pain in left ankle and joints of left foot: Secondary | ICD-10-CM | POA: Diagnosis not present

## 2019-07-18 DIAGNOSIS — M7989 Other specified soft tissue disorders: Secondary | ICD-10-CM | POA: Diagnosis not present

## 2019-07-18 DIAGNOSIS — M7752 Other enthesopathy of left foot: Secondary | ICD-10-CM | POA: Diagnosis not present

## 2019-07-18 MED ORDER — OXYCODONE-ACETAMINOPHEN 10-325 MG PO TABS: 1.0000 | ORAL_TABLET | Freq: Four times a day (QID) | ORAL | 0 refills | Status: AC | PRN

## 2019-07-18 MED ORDER — IBUPROFEN 800 MG PO TABS: 800.0000 mg | ORAL_TABLET | Freq: Three times a day (TID) | ORAL | 0 refills | Status: DC

## 2019-07-18 NOTE — Addendum Note (Signed)
Addended by: Boneta Lucks on: 07/18/2019 09:12 AM   Modules accepted: Orders

## 2019-07-20 ENCOUNTER — Encounter: Payer: Medicaid Other | Admitting: Podiatry

## 2019-07-27 ENCOUNTER — Encounter: Payer: Medicaid Other | Admitting: Podiatry

## 2019-07-27 ENCOUNTER — Ambulatory Visit (INDEPENDENT_AMBULATORY_CARE_PROVIDER_SITE_OTHER): Payer: Medicaid Other

## 2019-07-27 ENCOUNTER — Ambulatory Visit (INDEPENDENT_AMBULATORY_CARE_PROVIDER_SITE_OTHER): Payer: Medicaid Other | Admitting: Podiatry

## 2019-07-27 ENCOUNTER — Telehealth: Payer: Self-pay | Admitting: Podiatry

## 2019-07-27 ENCOUNTER — Other Ambulatory Visit: Payer: Self-pay

## 2019-07-27 DIAGNOSIS — M2042 Other hammer toe(s) (acquired), left foot: Secondary | ICD-10-CM

## 2019-07-27 DIAGNOSIS — Z9889 Other specified postprocedural states: Secondary | ICD-10-CM

## 2019-07-27 MED ORDER — OXYCODONE-ACETAMINOPHEN 10-325 MG PO TABS: 1.0000 | ORAL_TABLET | Freq: Four times a day (QID) | ORAL | 0 refills | Status: AC | PRN

## 2019-07-27 NOTE — Telephone Encounter (Signed)
Done. thanks

## 2019-07-27 NOTE — Telephone Encounter (Signed)
Pt was seen in office today and had oxycodone prescribed but needs to be sent to [facility].   Please resend.

## 2019-07-28 ENCOUNTER — Encounter: Payer: Self-pay | Admitting: Podiatry

## 2019-07-28 NOTE — Progress Notes (Signed)
Subjective:  Patient ID: Samantha [name], female    DOB: 1975-07-18,  MRN: [id number]  Chief Complaint  Patient presents with  . Routine Post Op    POV #1 DOS 07/18/2019 - LEFT 2ND,3RD,4TH HAMMERTOE CORRECTION WITH SHORTENING/OSTEOTOMY OF 2ND,3RD,4TH METATARSOPHALANGEAL JOINT WITH EXCISION OF SOFT TISSUE MASS WITH USE IF SCREWS/PINS OR BOTH    44 y.o. female returns for post-op check.  Patient states that she is doing well.  Patient states that she does have pretty good amount of pain and she was not expecting this much pain.  However she states her pain is getting better and she is managing it well.  She has been ambulating with a cam boot without any acute signs of infection.  Overall she denies any other complaints.  She would like to know if she can get for more pain medication as she has run out.  Review of Systems: Negative except as noted in the HPI. Denies N/V/F/Ch.  Past Medical History:  Diagnosis Date  . Arrhythmia   . Celiac disease   . Chronic pain   . DDD (degenerative disc disease)   . Degenerative disc disease, lumbar   . Interstitial cystitis   . Kidney stones     Current Outpatient Medications:  .  acetaminophen (TYLENOL) 500 MG tablet, Take 1,000 mg by mouth every 6 (six) hours as needed., Disp: , Rfl:  .  citalopram (CELEXA) 20 MG tablet, Take by mouth., Disp: , Rfl:  .  HYDROcodone-acetaminophen (NORCO/VICODIN) 5-325 MG tablet, Take 1 tablet by mouth every 4 (four) hours as needed., Disp: 6 tablet, Rfl: 0 .  ibuprofen (ADVIL) 800 MG tablet, Take 1 tablet (800 mg total) by mouth 3 (three) times daily., Disp: 21 tablet, Rfl: 0 .  meloxicam (MOBIC) 15 MG tablet, Take 15 mg by mouth daily as needed., Disp: , Rfl:  .  Meloxicam 15 MG TBDP, Take 15 mg by mouth daily as needed (pain)., Disp: 6 tablet, Rfl: 0 .  oxyCODONE-acetaminophen (PERCOCET) 10-325 MG tablet, Take 1 tablet by mouth every 6 (six) hours as needed for up to 8 days for pain., Disp: 30 tablet, Rfl: 0 .   oxyCODONE-acetaminophen (PERCOCET) 10-325 MG tablet, Take 1 tablet by mouth every 6 (six) hours as needed for up to 8 days for pain., Disp: 30 tablet, Rfl: 0  Social History   Tobacco Use  Smoking Status Current Every Day Smoker  . Packs/day: 0.50  . Types: Cigarettes  Smokeless Tobacco Never Used    Allergies  Allergen Reactions  . Gluten Other (See Comments)    Patient has condition that eliminates any Gluten intake  . Naproxen     abd pain    . Tramadol Nausea And Vomiting   Objective:  There were no vitals filed for this visit. There is no height or weight on file to calculate BMI. Constitutional Well developed. Well nourished.  Vascular Foot warm and well perfused. Capillary refill normal to all digits.   Neurologic Normal speech. Oriented to person, place, and time. Epicritic sensation to light touch grossly present bilaterally.  Dermatologic Skin healing well without signs of infection. Skin edges well coapted without signs of infection.  Orthopedic: Tenderness to palpation noted about the surgical site.   Radiographs: 3 views of skeletally mature adult foot: Good correction and alignment noted.  The wires are intact.  No signs of breakage noted. Assessment:   1. Hammertoe of left foot   2. Status post left foot surgery  Plan:  Patient was evaluated and treated and all questions answered.  S/p foot surgery left -Progressing as expected post-operatively. -XR: See above -WB Status: Nonweightbearing to the left lower extremity due to plantar incision in in cam boot with crutches -Sutures:  intact without any clinical signs of infection.  No dehiscence noted -Medications: Percocet -Foot redressed.   No follow-ups on file.

## 2019-08-03 ENCOUNTER — Ambulatory Visit (INDEPENDENT_AMBULATORY_CARE_PROVIDER_SITE_OTHER): Payer: Medicaid Other | Admitting: Podiatry

## 2019-08-03 ENCOUNTER — Other Ambulatory Visit: Payer: Self-pay

## 2019-08-03 DIAGNOSIS — Z9889 Other specified postprocedural states: Secondary | ICD-10-CM

## 2019-08-03 DIAGNOSIS — M2042 Other hammer toe(s) (acquired), left foot: Secondary | ICD-10-CM

## 2019-08-03 DIAGNOSIS — M7989 Other specified soft tissue disorders: Secondary | ICD-10-CM

## 2019-08-04 ENCOUNTER — Encounter: Payer: Self-pay | Admitting: Podiatry

## 2019-08-04 MED ORDER — OXYCODONE-ACETAMINOPHEN 10-325 MG PO TABS: 1.0000 | ORAL_TABLET | Freq: Four times a day (QID) | ORAL | 0 refills | Status: DC | PRN

## 2019-08-04 NOTE — Progress Notes (Signed)
Subjective:  Patient ID: Samantha [name], female    DOB: 31-Oct-1975,  MRN: [id number]  Chief Complaint  Patient presents with  . Routine Post Op    POV #2 DOS 07/18/2019 - LEFT 2ND,3RD,4TH HAMMERTOE CORRECTION WITH SHORTENING/OSTEOTOMY OF 2ND,3RD,4TH METATARSOPHALANGEAL JOINT WITH EXCISION OF SOFT TISSUE MASS WITH USE IF SCREWS/PINS OR BOTH, pt states that she stubbed her toe against a table.     44 y.o. female returns for post-op check.  Patient states that she is doing well.  She states that she was doing really well with the pain control.  And was managing her pain medication.  However she had an incident overnight that where she stubbed her toe against a table and it started hurting very badly.  She states that she had to take some pain medication.  She denies any other acute complaints.  It is well bandaged.  Patient has been wearing cam boot and trying to take it easy.  She denies any other acute complaints.  Review of Systems: Negative except as noted in the HPI. Denies N/V/F/Ch.  Past Medical History:  Diagnosis Date  . Arrhythmia   . Celiac disease   . Chronic pain   . DDD (degenerative disc disease)   . Degenerative disc disease, lumbar   . Interstitial cystitis   . Kidney stones     Current Outpatient Medications:  .  acetaminophen (TYLENOL) 500 MG tablet, Take 1,000 mg by mouth every 6 (six) hours as needed., Disp: , Rfl:  .  citalopram (CELEXA) 20 MG tablet, Take by mouth., Disp: , Rfl:  .  HYDROcodone-acetaminophen (NORCO/VICODIN) 5-325 MG tablet, Take 1 tablet by mouth every 4 (four) hours as needed., Disp: 6 tablet, Rfl: 0 .  ibuprofen (ADVIL) 800 MG tablet, Take 1 tablet (800 mg total) by mouth 3 (three) times daily., Disp: 21 tablet, Rfl: 0 .  meloxicam (MOBIC) 15 MG tablet, Take 15 mg by mouth daily as needed., Disp: , Rfl:  .  Meloxicam 15 MG TBDP, Take 15 mg by mouth daily as needed (pain)., Disp: 6 tablet, Rfl: 0 .  oxyCODONE-acetaminophen (PERCOCET) 10-325 MG  tablet, Take 1 tablet by mouth every 6 (six) hours as needed for up to 8 days for pain., Disp: 30 tablet, Rfl: 0 .  oxyCODONE-acetaminophen (PERCOCET) 10-325 MG tablet, Take 1 tablet by mouth every 6 (six) hours as needed for up to 8 days for pain., Disp: 30 tablet, Rfl: 0 .  oxyCODONE-acetaminophen (PERCOCET) 10-325 MG tablet, Take 1 tablet by mouth every 6 (six) hours as needed for up to 8 days for pain., Disp: 30 tablet, Rfl: 0  Social History   Tobacco Use  Smoking Status Current Every Day Smoker  . Packs/day: 0.50  . Types: Cigarettes  Smokeless Tobacco Never Used    Allergies  Allergen Reactions  . Gluten Other (See Comments)    Patient has condition that eliminates any Gluten intake  . Naproxen     abd pain    . Tramadol Nausea And Vomiting   Objective:  There were no vitals filed for this visit. There is no height or weight on file to calculate BMI. Constitutional Well developed. Well nourished.  Vascular Foot warm and well perfused. Capillary refill normal to all digits.   Neurologic Normal speech. Oriented to person, place, and time. Epicritic sensation to light touch grossly present bilaterally.  Dermatologic Skin healing well without signs of infection. Skin edges well coapted without signs of infection.  Orthopedic: Tenderness to palpation  noted about the surgical site.   Radiographs: 3 views of skeletally mature adult foot: Good correction and alignment noted.  The wires are intact.  No signs of breakage noted. Assessment:   1. Hammertoe of left foot   2. Status post left foot surgery   3. Soft tissue mass    Plan:  Patient was evaluated and treated and all questions answered.  S/p foot surgery left -Progressing as expected post-operatively. -XR: See above -WB Status: Nonweightbearing to the left lower extremity due to plantar incision in in cam boot with crutches -Sutures: Intact without any clinical signs of infection.  Some maceration noted.   Aggressive Betadine wet-to-dry dressing changes were applied.  I will plan on taking the stitches out during next visit. -Medications: Percocet -Foot redressed.   No follow-ups on file.

## 2019-08-10 ENCOUNTER — Encounter: Payer: Medicaid Other | Admitting: Podiatry

## 2019-08-12 ENCOUNTER — Encounter: Payer: Self-pay | Admitting: Podiatry

## 2019-08-17 ENCOUNTER — Other Ambulatory Visit: Payer: Self-pay

## 2019-08-17 ENCOUNTER — Ambulatory Visit (INDEPENDENT_AMBULATORY_CARE_PROVIDER_SITE_OTHER): Payer: Medicaid Other | Admitting: Podiatry

## 2019-08-17 DIAGNOSIS — Z9889 Other specified postprocedural states: Secondary | ICD-10-CM

## 2019-08-17 DIAGNOSIS — M7989 Other specified soft tissue disorders: Secondary | ICD-10-CM

## 2019-08-17 DIAGNOSIS — M2042 Other hammer toe(s) (acquired), left foot: Secondary | ICD-10-CM

## 2019-08-18 ENCOUNTER — Encounter: Payer: Self-pay | Admitting: Podiatry

## 2019-08-18 NOTE — Progress Notes (Signed)
Subjective:  Patient ID: Samantha [name], female    DOB: 1976/03/28,  MRN: [id number]  Chief Complaint  Patient presents with  . Routine Post Op     POV #3 DOS 07/18/2019 - LEFT 2ND,3RD,4TH HAMMERTOE CORRECTION WITH SHORTENING/OSTEOTOMY OF 2ND,3RD,4TH METATARSOPHALANGEAL JOINT WITH EXCISION OF SOFT TISSUE MASS WITH USE IF SCREWS/PINS OR BOTH    44 y.o. female returns for post-op check.  Patient states that she is doing well.  Patient has been noncompliant with constant hitting against the table at home.  However the pins are intact.  No clinical signs of infection noted.  The incision has healed really well.  The sutures were taken out in office today.  She denies any other acute complaints.  She has been ambulating with boot on.  Review of Systems: Negative except as noted in the HPI. Denies N/V/F/Ch.  Past Medical History:  Diagnosis Date  . Arrhythmia   . Celiac disease   . Chronic pain   . DDD (degenerative disc disease)   . Degenerative disc disease, lumbar   . Interstitial cystitis   . Kidney stones     Current Outpatient Medications:  .  acetaminophen (TYLENOL) 500 MG tablet, Take 1,000 mg by mouth every 6 (six) hours as needed., Disp: , Rfl:  .  citalopram (CELEXA) 20 MG tablet, Take by mouth., Disp: , Rfl:  .  HYDROcodone-acetaminophen (NORCO/VICODIN) 5-325 MG tablet, Take 1 tablet by mouth every 4 (four) hours as needed., Disp: 6 tablet, Rfl: 0 .  ibuprofen (ADVIL) 800 MG tablet, Take 1 tablet (800 mg total) by mouth 3 (three) times daily., Disp: 21 tablet, Rfl: 0 .  meloxicam (MOBIC) 15 MG tablet, Take 15 mg by mouth daily as needed., Disp: , Rfl:  .  Meloxicam 15 MG TBDP, Take 15 mg by mouth daily as needed (pain)., Disp: 6 tablet, Rfl: 0  Social History   Tobacco Use  Smoking Status Current Every Day Smoker  . Packs/day: 0.50  . Types: Cigarettes  Smokeless Tobacco Never Used    Allergies  Allergen Reactions  . Gluten Other (See Comments)    Patient has  condition that eliminates any Gluten intake  . Naproxen     abd pain    . Tramadol Nausea And Vomiting   Objective:  There were no vitals filed for this visit. There is no height or weight on file to calculate BMI. Constitutional Well developed. Well nourished.  Vascular Foot warm and well perfused. Capillary refill normal to all digits.   Neurologic Normal speech. Oriented to person, place, and time. Epicritic sensation to light touch grossly present bilaterally.  Dermatologic Skin healing well without signs of infection. Skin edges well coapted without signs of infection.  Orthopedic: Tenderness to palpation noted about the surgical site.   Radiographs: 3 views of skeletally mature adult foot: Good correction and alignment noted.  The wires are intact.  No signs of breakage noted. Assessment:   1. Hammertoe of left foot   2. Status post left foot surgery   3. Soft tissue mass    Plan:  Patient was evaluated and treated and all questions answered.  S/p foot surgery left -Progressing as expected post-operatively. -XR: See above -WB Status: Nonweightbearing to the left lower extremity due to plantar incision in in cam boot with crutches -Sutures: Sutures were removed without any complication.  No clinical signs of infection or dehiscence noted. -Medications: None -I will plan on removing the K wires out in 2 weeks.  No follow-ups on file.

## 2019-08-22 ENCOUNTER — Other Ambulatory Visit: Payer: Self-pay | Admitting: Podiatry

## 2019-08-31 ENCOUNTER — Encounter: Payer: Self-pay | Admitting: Podiatry

## 2019-08-31 ENCOUNTER — Ambulatory Visit (INDEPENDENT_AMBULATORY_CARE_PROVIDER_SITE_OTHER): Payer: Medicaid Other

## 2019-08-31 ENCOUNTER — Ambulatory Visit (INDEPENDENT_AMBULATORY_CARE_PROVIDER_SITE_OTHER): Payer: Medicaid Other | Admitting: Podiatry

## 2019-08-31 ENCOUNTER — Other Ambulatory Visit: Payer: Self-pay

## 2019-08-31 DIAGNOSIS — M2042 Other hammer toe(s) (acquired), left foot: Secondary | ICD-10-CM

## 2019-08-31 DIAGNOSIS — Z9889 Other specified postprocedural states: Secondary | ICD-10-CM

## 2019-09-01 ENCOUNTER — Encounter: Payer: Self-pay | Admitting: Podiatry

## 2019-09-01 NOTE — Progress Notes (Signed)
Subjective:  Patient ID: Samantha [name], female    DOB: 12-Feb-1976,  MRN: [id number]  Chief Complaint  Patient presents with  . Routine Post Op    DOS 07/18/2019 Hammertoe 2-4 with osteotomy/excision of soft tissue mass with screws; "doing great, no pain, no other concerns"    44 y.o. female returns for post-op check.  Patient states that she is doing well.  She states that she does not have pain anymore.  She would like to return to work.  She has been doing really well.  Today she is here to have her pin removed followed by final discharge.  Review of Systems: Negative except as noted in the HPI. Denies N/V/F/Ch.  Past Medical History:  Diagnosis Date  . Arrhythmia   . Celiac disease   . Chronic pain   . DDD (degenerative disc disease)   . Degenerative disc disease, lumbar   . Interstitial cystitis   . Kidney stones     Current Outpatient Medications:  .  acetaminophen (TYLENOL) 500 MG tablet, Take 1,000 mg by mouth every 6 (six) hours as needed., Disp: , Rfl:  .  citalopram (CELEXA) 20 MG tablet, Take by mouth., Disp: , Rfl:  .  HYDROcodone-acetaminophen (NORCO/VICODIN) 5-325 MG tablet, Take 1 tablet by mouth every 4 (four) hours as needed., Disp: 6 tablet, Rfl: 0 .  ibuprofen (ADVIL) 800 MG tablet, Take 1 tablet (800 mg total) by mouth 3 (three) times daily., Disp: 21 tablet, Rfl: 0 .  meloxicam (MOBIC) 15 MG tablet, Take 15 mg by mouth daily as needed., Disp: , Rfl:  .  Meloxicam 15 MG TBDP, Take 15 mg by mouth daily as needed (pain)., Disp: 6 tablet, Rfl: 0 .  oxyCODONE-acetaminophen (PERCOCET) 10-325 MG tablet, TAKE (1) TABLET BY MOUTH EVERY SIX HOURS AS NEEDED FOR PAIN., Disp: 30 tablet, Rfl: 0  Social History   Tobacco Use  Smoking Status Current Every Day Smoker  . Packs/day: 0.50  . Types: Cigarettes  Smokeless Tobacco Never Used    Allergies  Allergen Reactions  . Gluten Other (See Comments)    Patient has condition that eliminates any Gluten intake  .  Naproxen     abd pain    . Tramadol Nausea And Vomiting   Objective:  There were no vitals filed for this visit. There is no height or weight on file to calculate BMI. Constitutional Well developed. Well nourished.  Vascular Foot warm and well perfused. Capillary refill normal to all digits.   Neurologic Normal speech. Oriented to person, place, and time. Epicritic sensation to light touch grossly present bilaterally.  Dermatologic  incision completely epithelialized.  No clinical signs of infection noted.  Pins are intact.  Orthopedic:  Notenderness to palpation noted about the surgical site.   Radiographs: 3 views of skeletally mature adult foot: Good correction and alignment noted.  The wires are intact.  No signs of breakage noted. Assessment:   1. Hammertoe of left foot   2. Status post left foot surgery    Plan:  Patient was evaluated and treated and all questions answered.  S/p foot surgery left -Progressing as expected post-operatively. -XR: See above -WB Status: Weightbearing as tolerated in regular sneakers -Sutures: None.  Skin has reepithelialized -Medications: None -K wires were removed in office today without any complication. -Patient is officially discharged from my care.  Given that patient has clinically improved without any pain, I feel comfortable discharging her from my care.  I have asked her that  if any foot and ankle issues arise in the future come back and see me.  Patient states understanding   No follow-ups on file.

## 2019-09-07 ENCOUNTER — Telehealth: Payer: Self-pay | Admitting: *Deleted

## 2019-09-07 ENCOUNTER — Other Ambulatory Visit: Payer: Self-pay | Admitting: Podiatry

## 2019-09-07 ENCOUNTER — Telehealth: Payer: Self-pay | Admitting: Podiatry

## 2019-09-07 NOTE — Telephone Encounter (Signed)
Pt called for appt and set appt and asked for something for the pain until visit on 09/09/19

## 2019-09-07 NOTE — Telephone Encounter (Signed)
Unable to leave a message for pt to only take the percocet as directed and not to take and drive, operate machinery or at work, the mailbox is full.

## 2019-09-07 NOTE — Telephone Encounter (Signed)
Pt called states at last visit she was told she could go back to work, walking and can't feel her toes and she is out of medication.

## 2019-09-07 NOTE — Telephone Encounter (Signed)
I informed pt of Dr. Serita Grit orders and that she should not take the pain medication and work or drive. Pt states understanding.

## 2019-09-07 NOTE — Telephone Encounter (Signed)
I sent it in 

## 2019-09-09 ENCOUNTER — Other Ambulatory Visit: Payer: Self-pay

## 2019-09-09 ENCOUNTER — Encounter: Payer: Self-pay | Admitting: Podiatry

## 2019-09-09 ENCOUNTER — Ambulatory Visit (INDEPENDENT_AMBULATORY_CARE_PROVIDER_SITE_OTHER): Payer: Medicaid Other | Admitting: Podiatry

## 2019-09-09 ENCOUNTER — Ambulatory Visit (INDEPENDENT_AMBULATORY_CARE_PROVIDER_SITE_OTHER): Payer: Medicaid Other

## 2019-09-09 DIAGNOSIS — Z9889 Other specified postprocedural states: Secondary | ICD-10-CM

## 2019-09-09 DIAGNOSIS — R601 Generalized edema: Secondary | ICD-10-CM

## 2019-09-09 DIAGNOSIS — M2042 Other hammer toe(s) (acquired), left foot: Secondary | ICD-10-CM

## 2019-09-09 NOTE — Progress Notes (Signed)
Subjective:  Patient ID: Samantha [name], female    DOB: 08/12/1975,  MRN: [id number]  Chief Complaint  Patient presents with  . Foot Pain    pt is here for a f/u of foot pain after surgery, pt states that she is still having some pain, and puts pain scale as a 4 out of 10 on the pain scale.    44 y.o. female presents with the above complaint. Patient is doing well from surgical standpoint. Patient is overall very happy with the surgery. She denies any other acute complaints. She had a secondary concern of generalized edema to the lower extremities especially after work. She works about 6 hours shifts continuously. I have asked her to take it easy to elevate and wear compression socks to help with swelling. Patient denies any other acute complaints she just overall wanted make sure everything was okay.   Review of Systems: Negative except as noted in the HPI. Denies N/V/F/Ch.  Past Medical History:  Diagnosis Date  . Arrhythmia   . Celiac disease   . Chronic pain   . DDD (degenerative disc disease)   . Degenerative disc disease, lumbar   . Interstitial cystitis   . Kidney stones     Current Outpatient Medications:  .  acetaminophen (TYLENOL) 500 MG tablet, Take 1,000 mg by mouth every 6 (six) hours as needed., Disp: , Rfl:  .  citalopram (CELEXA) 20 MG tablet, Take by mouth., Disp: , Rfl:  .  HYDROcodone-acetaminophen (NORCO/VICODIN) 5-325 MG tablet, Take 1 tablet by mouth every 4 (four) hours as needed., Disp: 6 tablet, Rfl: 0 .  ibuprofen (ADVIL) 800 MG tablet, Take 1 tablet (800 mg total) by mouth 3 (three) times daily., Disp: 21 tablet, Rfl: 0 .  meloxicam (MOBIC) 15 MG tablet, Take 15 mg by mouth daily as needed., Disp: , Rfl:  .  Meloxicam 15 MG TBDP, Take 15 mg by mouth daily as needed (pain)., Disp: 6 tablet, Rfl: 0 .  oxyCODONE-acetaminophen (PERCOCET) 10-325 MG tablet, TAKE (1) TABLET BY MOUTH EVERY SIX HOURS AS NEEDED FOR PAIN., Disp: 30 tablet, Rfl: 0  Social History    Tobacco Use  Smoking Status Current Every Day Smoker  . Packs/day: 0.50  . Types: Cigarettes  Smokeless Tobacco Never Used    Allergies  Allergen Reactions  . Gluten Other (See Comments)    Patient has condition that eliminates any Gluten intake  . Naproxen     abd pain    . Tramadol Nausea And Vomiting   Objective:  There were no vitals filed for this visit. There is no height or weight on file to calculate BMI. Constitutional Well developed. Well nourished.  Vascular Dorsalis pedis pulses palpable bilaterally. Posterior tibial pulses palpable bilaterally. Capillary refill normal to all digits.  No cyanosis or clubbing noted. Pedal hair growth normal.  Neurologic Normal speech. Oriented to person, place, and time. Epicritic sensation to light touch grossly present bilaterally.  Dermatologic Nails well groomed and normal in appearance. No open wounds. No skin lesions.  Orthopedic:  Generalized lower extremity edema 2+ pitting. No pain at the surgical site. Incision has completely reepithelialized.   Radiographs: None Assessment:   1. Hammertoe of left foot   2. Status post left foot surgery   3. Generalized edema    Plan:  Patient was evaluated and treated and all questions answered.  Generalized lower extremity edema -I explained to the patient the etiology of edema and various treatment options were extensively discussed.  Given how aggressive she does on her feet with 6-hour workdays I have asked her to rest every 2 hours and elevate if she is able to. She was given a note to allow this at work. She will also obtain compression socks which will help decrease the edema. -Surgical site has healed well. No acute complaints. Clinically she has considerably improved.  No follow-ups on file.

## 2019-09-26 ENCOUNTER — Other Ambulatory Visit: Payer: Self-pay | Admitting: Podiatry

## 2019-09-26 ENCOUNTER — Telehealth: Payer: Self-pay | Admitting: *Deleted

## 2019-09-26 NOTE — Telephone Encounter (Signed)
Pt called states when she is having a lot of swelling and pain during work and rest and elevates as much as possible.

## 2019-09-26 NOTE — Telephone Encounter (Signed)
I told pt that in a surgery foot there may be swelling on and off for 6-9 months to varying degrees and she should continue to rest, ice and elevate during these episode. Pt denies fever, red streaks or drainage. I asked pt what kind of problem she has with NSAID and she said sour stomach and some time vomiting.

## 2019-09-27 MED ORDER — CELECOXIB 200 MG PO CAPS: 200.0000 mg | ORAL_CAPSULE | Freq: Two times a day (BID) | ORAL | 1 refills | Status: DC

## 2019-09-27 NOTE — Telephone Encounter (Signed)
Yes that is fine.  Thank you.

## 2019-09-27 NOTE — Telephone Encounter (Signed)
Left message informing pt of Dr. Serita Grit orders for Celebrex 200mg  #60 one capsule bid for pain and inflammation.

## 2019-10-03 ENCOUNTER — Telehealth: Payer: Self-pay | Admitting: Podiatry

## 2019-10-03 ENCOUNTER — Telehealth: Payer: Self-pay | Admitting: *Deleted

## 2019-10-03 NOTE — Telephone Encounter (Signed)
Pt called stating she is getting sick everytime she takes the celebrex she has vomiting and nausea, also stating that her 2nd toe is in pain and swelling

## 2019-10-03 NOTE — Telephone Encounter (Signed)
I instructed Samantha [name] - scheduler to get pt in for an appt, I had just left pt a message to schedule.

## 2019-10-03 NOTE — Telephone Encounter (Signed)
Pt states she has been taking the celebrex and it makes her throw up and her foot is swollen and she can't work and she wants.

## 2019-10-03 NOTE — Telephone Encounter (Signed)
Message left for pt to make an appt.

## 2019-10-05 ENCOUNTER — Ambulatory Visit: Payer: Medicaid Other | Admitting: Podiatry

## 2019-10-10 ENCOUNTER — Telehealth: Payer: Self-pay | Admitting: *Deleted

## 2019-10-10 NOTE — Telephone Encounter (Signed)
I called pt and told her I would have a scheduler call tomorrow to get her in to be seen.

## 2019-10-10 NOTE — Telephone Encounter (Signed)
Pt called states she is not sure her foot is not infected she has swelling, redness, nausea, chills and sweating.

## 2019-10-11 ENCOUNTER — Other Ambulatory Visit: Payer: Self-pay

## 2019-10-11 ENCOUNTER — Ambulatory Visit: Payer: Medicaid Other | Admitting: Podiatry

## 2019-10-11 ENCOUNTER — Ambulatory Visit (INDEPENDENT_AMBULATORY_CARE_PROVIDER_SITE_OTHER): Payer: Medicaid Other

## 2019-10-11 DIAGNOSIS — Z9889 Other specified postprocedural states: Secondary | ICD-10-CM

## 2019-10-11 DIAGNOSIS — M2042 Other hammer toe(s) (acquired), left foot: Secondary | ICD-10-CM | POA: Diagnosis not present

## 2019-10-11 DIAGNOSIS — M778 Other enthesopathies, not elsewhere classified: Secondary | ICD-10-CM

## 2019-10-11 DIAGNOSIS — G8929 Other chronic pain: Secondary | ICD-10-CM

## 2019-10-11 DIAGNOSIS — M79672 Pain in left foot: Secondary | ICD-10-CM

## 2019-10-11 MED ORDER — IBUPROFEN 800 MG PO TABS: 800.0000 mg | ORAL_TABLET | Freq: Three times a day (TID) | ORAL | 0 refills | Status: DC | PRN

## 2019-10-11 MED ORDER — METHYLPREDNISOLONE 4 MG PO TBPK: ORAL_TABLET | ORAL | 0 refills | Status: DC

## 2019-10-11 MED ORDER — HYDROCODONE-ACETAMINOPHEN 5-325 MG PO TABS: 1.0000 | ORAL_TABLET | Freq: Four times a day (QID) | ORAL | 0 refills | Status: DC | PRN

## 2019-10-11 NOTE — Progress Notes (Signed)
   Subjective:  Patient presents today status post hammertoe repair 2, 3, 4 as well as excision of a benign lesion to the plantar aspect of the forefoot left foot performed by Dr. Posey Pronto. DOS: 07/18/2019.  Patient presents today with severe throbbing pain.  Patient states that this pain has been ongoing since even before surgery.  She did have an MRI performed preoperatively which was concerning for a soft tissue mass to the plantar aspect of the forefoot.  Patient subsequently underwent excision of the soft tissue mass with hammertoe repair.  Unfortunately the pain continues and has not improved over the course of the last few months.  She presents for further treatment evaluation  Past Medical History:  Diagnosis Date  . Arrhythmia   . Celiac disease   . Chronic pain   . DDD (degenerative disc disease)   . Degenerative disc disease, lumbar   . Interstitial cystitis   . Kidney stones       Objective/Physical Exam Neurovascular status intact.  Pedal pulses are palpable.  Skin incisions appear to be well coapted with sutures and staples intact.  Hammertoes appear to be in a rectus alignment.  No sign of infectious process noted. No dehiscence.  Minimal edema noted to the surgical extremity.  Hypersensitivity noted with pain even with light touch to the extremities.  This may be concerning for possible onset of CRPS  Radiographic Exam:  Osteotomies sites appear to be stable with routine healing at the hammertoe PIPJ's.  Absence of the metatarsal heads noted.  Second toes in a great rectus alignment.  There is some malalignment of the PIPJ of the third toe which appears to have occurred after removal of the percutaneous fixation pins.  Assessment: 1. s/p hammertoe repair 2, 3, 4 left foot with excision of benign soft tissue mass. DOS: 07/18/2019 2.  Chronic severe foot pain left   Plan of Care:  1. Patient was evaluated. X-rays reviewed 2.  Recommend the patient resume the cam boot.  Short cam  boot was provided and dispensed today.  Weightbearing as tolerated 3.  Prescription for Medrol Dosepak 4.  Prescription for ibuprofen 800 mg 3 times daily after completing the Medrol Dosepak 5.  Prescription for Vicodin 5/325 mg #30 6.  Order placed for physical therapy at [facility] physical therapy 7.  Return to clinic in 3 weeks   Edrick Kins, DPM [facility]  Dr. Edrick Kins, [address]                                        [place], [place] [address]                Office [phone]  Fax [phone]

## 2019-10-17 ENCOUNTER — Ambulatory Visit: Payer: Medicaid Other | Admitting: Podiatry

## 2019-10-19 ENCOUNTER — Ambulatory Visit: Payer: Medicaid Other | Admitting: Podiatry

## 2019-10-23 ENCOUNTER — Emergency Department (HOSPITAL_COMMUNITY)
Admission: EM | Admit: 2019-10-23 | Discharge: 2019-10-23 | Disposition: A | Discharge status: Home / Self Care | Payer: Medicaid Other | Attending: Emergency Medicine | Admitting: Emergency Medicine

## 2019-10-23 ENCOUNTER — Other Ambulatory Visit: Payer: Self-pay

## 2019-10-23 ENCOUNTER — Encounter (HOSPITAL_COMMUNITY): Payer: Self-pay | Admitting: Emergency Medicine

## 2019-10-23 DIAGNOSIS — R2242 Localized swelling, mass and lump, left lower limb: Secondary | ICD-10-CM | POA: Insufficient documentation

## 2019-10-23 DIAGNOSIS — M79672 Pain in left foot: Secondary | ICD-10-CM

## 2019-10-23 DIAGNOSIS — F17210 Nicotine dependence, cigarettes, uncomplicated: Secondary | ICD-10-CM | POA: Insufficient documentation

## 2019-10-23 MED ORDER — DICLOFENAC SODIUM 1 % EX GEL
2.0000 g | Freq: Once | CUTANEOUS | Status: AC
  Administered 2019-10-23: 2 g via TOPICAL
  Filled 2019-10-23: qty 100

## 2019-10-23 MED ORDER — DICLOFENAC SODIUM 1 % EX GEL: 2.0000 g | Freq: Four times a day (QID) | CUTANEOUS | 1 refills | Status: DC

## 2019-10-23 NOTE — ED Notes (Addendum)
Per podiatrist note pt rx'd vicodin #30 on 5/18  She reports pain increased since 5/23  Has appt in 2 weeks with doctor   Denies that she received pain med rx   States "middle toe feels like it will bust"   Ice and ibuprofen not helping

## 2019-10-23 NOTE — ED Triage Notes (Signed)
Pt reports she had left foot surgery in Feb, has had swelling to toes and foot since, saw orthopedic for follow-up last week and was prescribed prednisone with no relief

## 2019-10-23 NOTE — Discharge Instructions (Signed)
Antiinflammatory medications: Take 600 mg of ibuprofen every 6 hours or 440 mg (over the counter dose) to 500 mg (prescription dose) of naproxen every 12 hours for the next 3 days. After this time, these medications may be used as needed for pain. Take these medications with food to avoid upset stomach. Choose only one of these medications, do not take them together. Acetaminophen (generic for Tylenol): Should you continue to have additional pain while taking the ibuprofen or naproxen, you may add in acetaminophen as needed. Your daily total maximum amount of acetaminophen from all sources should be limited to 4000mg /day for persons without liver problems, or 2000mg /day for those with liver problems.  Diclofenac gel: This is a topical anti-inflammatory medication and can be applied directly to the painful region.  Do not use on the face or genitals.  This medication may be used as an alternative to oral anti-inflammatory medications, such as ibuprofen or naproxen.  Recommend follow-up with the podiatry team for any further management.

## 2019-10-23 NOTE — ED Notes (Signed)
Pt reports foot surgery by podiatrist   Seen last week for chronic foot pain   Given prednisone without pain relief   Here for pain control

## 2019-10-23 NOTE — ED Notes (Signed)
Call to pharm   She will bring ointment

## 2019-10-23 NOTE — ED Provider Notes (Signed)
[facility] EMERGENCY DEPARTMENT Provider Note   CSN: [id number] Arrival date & time: 10/23/19  1200     History Chief Complaint  Patient presents with   Foot Pain    Samantha [name] is a 44 y.o. female.  HPI      Samantha [name] is a 44 y.o. female, with a history of surgical correction of left hammertoe, presenting to the ED with left toe pain and swelling waxing and waning for the last few weeks. Pain is throbbing, sometimes burning, worse with ambulation, mostly in the second and third left toes, moderate to severe, radiating proximally. She states she will go through cycles of management of her toe issue where she thinks she "just needs to get back to normal activities." This can exacerbate her pain and swelling. She then gets frustrated that she is having the pain and the swelling and will go back to using her cam boot and elevating her foot, as directed. She was most recently evaluated by podiatry on May 18. She was prescribed Medrol Dosepak, which she states improved the pain and swelling temporarily.  She denies fever/chills, numbness, pain/swelling into the ankle, spreading redness, subsequent injury, or any other complaints.   Past Medical History:  Diagnosis Date   Arrhythmia    Celiac disease    Chronic pain    DDD (degenerative disc disease)    Degenerative disc disease, lumbar    Interstitial cystitis    Kidney stones     Patient Active Problem List   Diagnosis Date Noted   Suicidal risk 02/04/2013   Depression 02/01/2013   Panic disorder 02/01/2013   PTSD (post-traumatic stress disorder) 02/01/2013   ANXIETY DISORDER 12/05/2008   CELIAC DISEASE 12/05/2008   OVARIAN CYST 12/05/2008   LOW BACK PAIN, CHRONIC 12/05/2008   ABDOMINAL PAIN, CHRONIC 12/05/2008   SUPRAVENTRICULAR TACHYCARDIA, HX OF 12/05/2008    Past Surgical History:  Procedure Laterality Date   ABDOMINAL HYSTERECTOMY     ADENOIDECTOMY     APPENDECTOMY     CESAREAN  SECTION     HERNIA REPAIR     KIDNEY STONE SURGERY     TONSILLECTOMY       OB History   No obstetric history on file.     Family History  Problem Relation Age of Onset   Cancer Mother    Heart disease Mother     Social History   Tobacco Use   Smoking status: Current Every Day Smoker    Packs/day: 0.50    Types: Cigarettes   Smokeless tobacco: Never Used  Substance Use Topics   Alcohol use: No   Drug use: No    Types: Marijuana    Comment: last used x 1 1/2 years    Home Medications Prior to Admission medications   Medication Sig Start Date End Date Taking? Authorizing Provider  acetaminophen (TYLENOL) 500 MG tablet Take 1,000 mg by mouth every 6 (six) hours as needed.    [provider]  celecoxib (CELEBREX) 200 MG capsule Take 1 capsule (200 mg total) by mouth 2 (two) times daily. 09/27/19   Felipa Furnace, DPM  citalopram (CELEXA) 20 MG tablet Take by mouth. 04/12/18   [provider]  diclofenac Sodium (VOLTAREN) 1 % GEL Apply 2 g topically 4 (four) times daily. 10/23/19   Shea Swalley C, PA-C  HYDROcodone-acetaminophen (NORCO/VICODIN) 5-325 MG tablet Take 1 tablet by mouth every 6 (six) hours as needed for moderate pain. 10/11/19   Daylene Katayama  M, DPM  ibuprofen (ADVIL) 800 MG tablet Take 1 tablet (800 mg total) by mouth every 8 (eight) hours as needed. 10/11/19   Edrick Kins, DPM  meloxicam (MOBIC) 15 MG tablet Take 15 mg by mouth daily as needed. 04/10/19   [provider]  Meloxicam 15 MG TBDP Take 15 mg by mouth daily as needed (pain). 04/09/19   Petrucelli, Glynda Jaeger, PA-C  methylPREDNISolone (MEDROL DOSEPAK) 4 MG TBPK tablet 6 day dose pack - take as directed 10/11/19   Edrick Kins, DPM  oxyCODONE-acetaminophen (PERCOCET) 10-325 MG tablet TAKE (1) TABLET BY MOUTH EVERY SIX HOURS AS NEEDED FOR PAIN. 09/07/19   Felipa Furnace, DPM    Allergies    Gluten, Naproxen, and Tramadol  Review of Systems   Review of Systems    Constitutional: Negative for chills and fever.  Musculoskeletal: Positive for arthralgias and joint swelling.  Neurological: Negative for weakness and numbness.    Physical Exam Updated Vital Signs BP (!) 99/55 (BP Location: Left Arm)    Pulse 67    Temp 98.3 F (36.8 C) (Oral)    Resp 18    Ht 5\' 1"  (1.549 m)    Wt 83.9 kg    SpO2 97%    BMI 34.96 kg/m   Physical Exam Vitals and nursing note reviewed.  Constitutional:      General: She is not in acute distress.    Appearance: She is well-developed. She is not diaphoretic.  HENT:     Head: Normocephalic and atraumatic.  Eyes:     Conjunctiva/sclera: Conjunctivae normal.  Cardiovascular:     Rate and Rhythm: Normal rate and regular rhythm.     Pulses:          Dorsalis pedis pulses are 2+ on the left side.       Posterior tibial pulses are 2+ on the left side.  Pulmonary:     Effort: Pulmonary effort is normal.  Musculoskeletal:     Cervical back: Neck supple.     Comments: Tenderness and swelling to left second and third toes. No noted increased warmth or erythema to the toes or foot. No noted skin lesions. Surgical incisions appear to be well-healed.  Skin:    General: Skin is warm and dry.     Capillary Refill: Capillary refill takes less than 2 seconds.     Coloration: Skin is not pale.  Neurological:     Mental Status: She is alert.     Comments: Sensation light touch grossly intact in the toes of the left foot. Motor function intact in the toes of the left foot.  Psychiatric:        Behavior: Behavior normal.     ED Results / Procedures / Treatments   Labs (all labs ordered are listed, but only abnormal results are displayed) Labs Reviewed - No data to display  EKG None  Radiology No results found.  Radiographic Exam (10/11/19) Left foot:  Osteotomies sites appear to be stable with routine healing at the hammertoe PIPJ's.  Absence of the metatarsal heads noted.  Second toes in a great rectus alignment.   There is some malalignment of the PIPJ of the third toe which appears to have occurred after removal of the percutaneous fixation pins.   Procedures Procedures (including critical care time)  Medications Ordered in ED Medications  diclofenac Sodium (VOLTAREN) 1 % topical gel 2 g (2 g Topical Given 10/23/19 1329)    ED Course  I have reviewed the triage vital signs and the nursing notes.  Pertinent labs & imaging results that were available during my care of the patient were reviewed by me and considered in my medical decision making (see chart for details).  Clinical Course as of Oct 22 1345  Sun Oct 23, 2019  1336 Patient noted to have fluctuating blood pressures in the system. She does not have symptoms consistent with symptomatic hypotension. She denies any recent illness.  BP(!): 99/55 [SJ]    Clinical Course User Index [SJ] Mabel Unrein, Helane Gunther, PA-C   MDM Rules/Calculators/A&P                      Patient presents with intermittent pain and swelling to the left toes. Patient's previous office notes were reviewed. There was an x-ray performed in the office on May 18, which was largely unremarkable for acute abnormalities that would acutely explain patient's symptoms. It was noted that there was some concern for possible CRPS in this patient. She does not have findings suspicious for acute infection. She has follow-up scheduled with her podiatrist. The patient was given instructions for home care as well as return precautions. Patient voices understanding of these instructions, accepts the plan, and is comfortable with discharge.   Final Clinical Impression(s) / ED Diagnoses Final diagnoses:  Foot pain, left    Rx / DC Orders ED Discharge Orders         Ordered    diclofenac Sodium (VOLTAREN) 1 % GEL  4 times daily     10/23/19 1318           Gwenivere Hiraldo C, PA-C 10/23/19 1351    Noemi Chapel, MD 10/24/19 [phone]

## 2019-10-25 ENCOUNTER — Other Ambulatory Visit: Payer: Self-pay

## 2019-10-25 ENCOUNTER — Encounter: Payer: Self-pay | Admitting: Podiatry

## 2019-10-25 ENCOUNTER — Ambulatory Visit: Payer: Medicaid Other | Admitting: Podiatry

## 2019-10-25 DIAGNOSIS — Z9889 Other specified postprocedural states: Secondary | ICD-10-CM

## 2019-10-25 DIAGNOSIS — M2042 Other hammer toe(s) (acquired), left foot: Secondary | ICD-10-CM | POA: Diagnosis not present

## 2019-10-25 MED ORDER — IBUPROFEN 800 MG PO TABS: 800.0000 mg | ORAL_TABLET | Freq: Three times a day (TID) | ORAL | 0 refills | Status: DC | PRN

## 2019-10-25 MED ORDER — HYDROCODONE-ACETAMINOPHEN 5-325 MG PO TABS: 1.0000 | ORAL_TABLET | Freq: Four times a day (QID) | ORAL | 0 refills | Status: DC | PRN

## 2019-10-25 NOTE — Progress Notes (Signed)
   Subjective:  Patient presents today status post hammertoe repair 2, 3, 4 as well as excision of a benign lesion to the plantar aspect of the forefoot left foot performed by Dr. Posey Pronto. DOS: 07/18/2019.  Patient states there has been no improvement since last visit on 10/11/2019.  She actually went to the emergency department on Sunday due to the pain.  She was given Voltaren 1% gel which has not helped to alleviate her symptoms.  She continues to weight-bear in the cam boot.  Last visit on 10/11/2019 she was given a prednisone pack which did not help alleviate any of her symptoms.  She presents for further treatment evaluation  Past Medical History:  Diagnosis Date  . Arrhythmia   . Celiac disease   . Chronic pain   . DDD (degenerative disc disease)   . Degenerative disc disease, lumbar   . Interstitial cystitis   . Kidney stones       Objective/Physical Exam Neurovascular status intact.  Pedal pulses are palpable.  Skin incisions appear to be well coapted with sutures and staples intact.  Hammertoes appear to be in a rectus alignment.  There is very limited range of motion to the hammertoe repair areas consistent with scar tissue adhesion.  No sign of infectious process noted. No dehiscence.  Minimal edema noted to the surgical extremity.  Hypersensitivity noted with pain even with light touch to the extremities.  This may be concerning for possible onset of CRPS  Assessment: 1. s/p hammertoe repair 2, 3, 4 left foot with excision of benign soft tissue mass. DOS: 07/18/2019 2.  Chronic severe foot pain left   Plan of Care:  1. Patient was evaluated. 2.  At the moment recommend discontinuing the cam boot.  Recommend daily range of motion exercises and soft tissue manipulation to the toes. 3.  Physical therapy was ordered last visit.  Patient begins physical therapy at [facility] tomorrow, 10/26/2019 4.  Postoperative shoe dispensed.  Weightbearing as tolerated 5.  Refill prescription  ibuprofen 800 mg 3 times daily 6.  Refill prescription for Vicodin 5/325 mg #30 7.  Return to clinic in 4 weeks with Dr. Dell Ponto, DPM [facility]  Dr. Edrick Kins, [address]                                        [place], [place] [address]                Office [phone]  Fax [phone]

## 2019-10-26 ENCOUNTER — Telehealth: Payer: Self-pay

## 2019-10-26 ENCOUNTER — Other Ambulatory Visit: Payer: Self-pay | Admitting: Podiatry

## 2019-10-26 MED ORDER — IBUPROFEN 800 MG PO TABS: 800.0000 mg | ORAL_TABLET | Freq: Three times a day (TID) | ORAL | 0 refills | Status: DC | PRN

## 2019-10-26 MED ORDER — HYDROCODONE-ACETAMINOPHEN 5-325 MG PO TABS: 1.0000 | ORAL_TABLET | Freq: Four times a day (QID) | ORAL | 0 refills | Status: DC | PRN

## 2019-10-26 NOTE — Telephone Encounter (Signed)
The above patient called in about the medication that she was prescribed on yesterday. Script was sent to the wrong Pharmacy. Please send the script to [facility] in [place]. Patient's phone # is [phone]. Thanks Medication was Ibuprofen and Vicodin.   Please send to [facility]

## 2019-10-26 NOTE — Telephone Encounter (Signed)
Rx sent to [facility] - Dr. Amalia Hailey

## 2019-11-01 ENCOUNTER — Ambulatory Visit: Payer: Medicaid Other | Admitting: Podiatry

## 2019-11-24 ENCOUNTER — Ambulatory Visit: Payer: Medicaid Other | Admitting: Podiatry

## 2019-11-24 ENCOUNTER — Other Ambulatory Visit: Payer: Self-pay

## 2019-11-24 DIAGNOSIS — M792 Neuralgia and neuritis, unspecified: Secondary | ICD-10-CM

## 2019-11-24 DIAGNOSIS — M795 Residual foreign body in soft tissue: Secondary | ICD-10-CM | POA: Diagnosis not present

## 2019-11-24 MED ORDER — OXYCODONE-ACETAMINOPHEN 10-325 MG PO TABS: 1.0000 | ORAL_TABLET | Freq: Three times a day (TID) | ORAL | 0 refills | Status: AC | PRN

## 2019-11-25 ENCOUNTER — Encounter: Payer: Self-pay | Admitting: Podiatry

## 2019-11-25 NOTE — Progress Notes (Signed)
Subjective:  Patient ID: Samantha [name], female    DOB: [birth year],  MRN: [id number]  Chief Complaint  Patient presents with  . Foot Pain    pt is here for left foot pain, possible hammertoe.    44 y.o. female presents with the above complaint.  Patient presents with a complaint of hammertoe repair to the 3 4 as well as excision of benign skin to the plantar aspect of the forefoot left side.  Patient states that she is doing okay.  She does not have any pain in the bottom of the foot.  She states that she occasionally gets numbness and tingling and more of neuropathic pain where the incisions were made.  She states the Voltaren gel has not helped at all.  She denies any other acute complaints.  She has been weightbearing as tolerated in cam boot.  She would like to know if there is anything else that could be done.   Review of Systems: Negative except as noted in the HPI. Denies N/V/F/Ch.  Past Medical History:  Diagnosis Date  . Arrhythmia   . Celiac disease   . Chronic pain   . DDD (degenerative disc disease)   . Degenerative disc disease, lumbar   . Interstitial cystitis   . Kidney stones     Current Outpatient Medications:  .  acetaminophen (TYLENOL) 500 MG tablet, Take 1,000 mg by mouth every 6 (six) hours as needed., Disp: , Rfl:  .  celecoxib (CELEBREX) 200 MG capsule, Take 1 capsule (200 mg total) by mouth 2 (two) times daily., Disp: 60 capsule, Rfl: 1 .  citalopram (CELEXA) 20 MG tablet, Take by mouth., Disp: , Rfl:  .  diclofenac Sodium (VOLTAREN) 1 % GEL, Apply 2 g topically 4 (four) times daily., Disp: 100 g, Rfl: 1 .  HYDROcodone-acetaminophen (NORCO/VICODIN) 5-325 MG tablet, Take 1 tablet by mouth every 6 (six) hours as needed for moderate pain., Disp: 30 tablet, Rfl: 0 .  ibuprofen (ADVIL) 800 MG tablet, Take 1 tablet (800 mg total) by mouth every 8 (eight) hours as needed., Disp: 30 tablet, Rfl: 0 .  meloxicam (MOBIC) 15 MG tablet, Take 15 mg by mouth daily as  needed., Disp: , Rfl:  .  Meloxicam 15 MG TBDP, Take 15 mg by mouth daily as needed (pain)., Disp: 6 tablet, Rfl: 0 .  methylPREDNISolone (MEDROL DOSEPAK) 4 MG TBPK tablet, 6 day dose pack - take as directed, Disp: 21 tablet, Rfl: 0 .  oxyCODONE-acetaminophen (PERCOCET) 10-325 MG tablet, TAKE (1) TABLET BY MOUTH EVERY SIX HOURS AS NEEDED FOR PAIN., Disp: 30 tablet, Rfl: 0 .  oxyCODONE-acetaminophen (PERCOCET) 10-325 MG tablet, Take 1 tablet by mouth every 8 (eight) hours as needed for up to 5 days for pain., Disp: 15 tablet, Rfl: 0  Social History   Tobacco Use  Smoking Status Current Every Day Smoker  . Packs/day: 0.50  . Types: Cigarettes  Smokeless Tobacco Never Used    Allergies  Allergen Reactions  . Gluten Other (See Comments)    Patient has condition that eliminates any Gluten intake  . Naproxen     abd pain    . Tramadol Nausea And Vomiting   Objective:  There were no vitals filed for this visit. There is no height or weight on file to calculate BMI. Constitutional Well developed. Well nourished.  Vascular Dorsalis pedis pulses palpable bilaterally. Posterior tibial pulses palpable bilaterally. Capillary refill normal to all digits.  No cyanosis or clubbing noted. Pedal hair  growth normal.  Neurologic Normal speech. Oriented to person, place, and time. Epicritic sensation to light touch grossly present bilaterally.  Dermatologic Nails well groomed and normal in appearance. No open wounds. No skin lesions.  Orthopedic:  Mild pain on palpation/increase sensitivity to the second third and fourth digit hammertoe.  Good correction and alignment noted.  The skin has completely healed.  Good range of motion noted with excellent extensor and flexor tendon strength left side.   Radiographs: None Assessment:   1. Neuritis    Plan:  Patient was evaluated and treated and all questions answered.  Neuritis secondary to surgical intervention of hammertoes to the 3 4 of the  left side. -I explained patient the etiology of neuritis and his tendency to cause tingling as well as burning pain associated with it.  I explained to the patient it takes anywhere from 6 months to a year for the pain to go away.  Generally it is a signs of nerves healing.  I patient understands that. -For now I believe patient will benefit from some Percocet medication to help decrease some of the pain especially while ambulating.  I have told her to take it as needed.  And I told her to start weaning out of pain medication. -I also believe she will benefit from neuropathic cream from [place].  The prescription was sent to [facility].  No follow-ups on file.

## 2019-12-06 ENCOUNTER — Telehealth: Payer: Self-pay | Admitting: Podiatry

## 2019-12-06 NOTE — Telephone Encounter (Signed)
Pt called and stated that she is having pain and swelling and would like to see about getting pain medications

## 2019-12-07 MED ORDER — OXYCODONE-ACETAMINOPHEN 10-325 MG PO TABS: 1.0000 | ORAL_TABLET | Freq: Three times a day (TID) | ORAL | 0 refills | Status: AC | PRN

## 2019-12-07 NOTE — Telephone Encounter (Signed)
Done

## 2019-12-07 NOTE — Addendum Note (Signed)
Addended by: Boneta Lucks on: 12/07/2019 06:55 AM   Modules accepted: Orders

## 2020-01-05 ENCOUNTER — Encounter (HOSPITAL_COMMUNITY): Payer: Self-pay | Admitting: Emergency Medicine

## 2020-01-05 ENCOUNTER — Emergency Department (HOSPITAL_COMMUNITY)
Admission: EM | Admit: 2020-01-05 | Discharge: 2020-01-05 | Disposition: A | Discharge status: Home / Self Care | Payer: Medicaid Other | Attending: Emergency Medicine | Admitting: Emergency Medicine

## 2020-01-05 ENCOUNTER — Emergency Department (HOSPITAL_COMMUNITY): Payer: Medicaid Other

## 2020-01-05 ENCOUNTER — Other Ambulatory Visit: Payer: Self-pay

## 2020-01-05 DIAGNOSIS — S62522A Displaced fracture of distal phalanx of left thumb, initial encounter for closed fracture: Secondary | ICD-10-CM | POA: Diagnosis not present

## 2020-01-05 DIAGNOSIS — F17210 Nicotine dependence, cigarettes, uncomplicated: Secondary | ICD-10-CM | POA: Insufficient documentation

## 2020-01-05 DIAGNOSIS — S60112A Contusion of left thumb with damage to nail, initial encounter: Secondary | ICD-10-CM | POA: Diagnosis not present

## 2020-01-05 DIAGNOSIS — W540XXA Bitten by dog, initial encounter: Secondary | ICD-10-CM | POA: Diagnosis not present

## 2020-01-05 DIAGNOSIS — S6010XA Contusion of unspecified finger with damage to nail, initial encounter: Secondary | ICD-10-CM

## 2020-01-05 DIAGNOSIS — Z23 Encounter for immunization: Secondary | ICD-10-CM | POA: Insufficient documentation

## 2020-01-05 DIAGNOSIS — S61052A Open bite of left thumb without damage to nail, initial encounter: Secondary | ICD-10-CM | POA: Diagnosis not present

## 2020-01-05 DIAGNOSIS — S62523A Displaced fracture of distal phalanx of unspecified thumb, initial encounter for closed fracture: Secondary | ICD-10-CM

## 2020-01-05 DIAGNOSIS — Y9289 Other specified places as the place of occurrence of the external cause: Secondary | ICD-10-CM | POA: Insufficient documentation

## 2020-01-05 DIAGNOSIS — Y998 Other external cause status: Secondary | ICD-10-CM | POA: Diagnosis not present

## 2020-01-05 DIAGNOSIS — Z2914 Encounter for prophylactic rabies immune globin: Secondary | ICD-10-CM | POA: Insufficient documentation

## 2020-01-05 DIAGNOSIS — Y9389 Activity, other specified: Secondary | ICD-10-CM | POA: Insufficient documentation

## 2020-01-05 MED ORDER — TETANUS-DIPHTH-ACELL PERTUSSIS 5-2.5-18.5 LF-MCG/0.5 IM SUSP
0.5000 mL | Freq: Once | INTRAMUSCULAR | Status: AC
  Administered 2020-01-05: 0.5 mL via INTRAMUSCULAR
  Filled 2020-01-05: qty 0.5

## 2020-01-05 MED ORDER — AMOXICILLIN-POT CLAVULANATE 875-125 MG PO TABS: 1.0000 | ORAL_TABLET | Freq: Two times a day (BID) | ORAL | 0 refills | Status: DC

## 2020-01-05 MED ORDER — RABIES VACCINE, PCEC IM SUSR
1.0000 mL | Freq: Once | INTRAMUSCULAR | Status: AC
  Administered 2020-01-05: 1 mL via INTRAMUSCULAR
  Filled 2020-01-05: qty 1

## 2020-01-05 MED ORDER — RABIES IMMUNE GLOBULIN 150 UNIT/ML IM INJ
20.0000 [IU]/kg | INJECTION | Freq: Once | INTRAMUSCULAR | Status: AC
  Administered 2020-01-05: 1725 [IU] via INTRAMUSCULAR
  Filled 2020-01-05: qty 11.5

## 2020-01-05 MED ORDER — AMOXICILLIN-POT CLAVULANATE 875-125 MG PO TABS
1.0000 | ORAL_TABLET | Freq: Once | ORAL | Status: AC
  Administered 2020-01-05: 1 via ORAL
  Filled 2020-01-05: qty 1

## 2020-01-05 MED ORDER — HYDROCODONE-ACETAMINOPHEN 5-325 MG PO TABS
1.0000 | ORAL_TABLET | Freq: Once | ORAL | Status: AC
  Administered 2020-01-05: 1 via ORAL
  Filled 2020-01-05: qty 1

## 2020-01-05 MED ORDER — HYDROCODONE-ACETAMINOPHEN 5-325 MG PO TABS: 1.0000 | ORAL_TABLET | ORAL | 0 refills | Status: DC | PRN

## 2020-01-05 NOTE — ED Notes (Signed)
Currently waiting for pharmacy to bring Immune Globulin

## 2020-01-05 NOTE — Discharge Instructions (Signed)
You were evaluated in the Emergency Department and after careful evaluation, we did not find any emergent condition requiring admission or further testing in the hospital.  Your x-ray shows a broken bone at the tip of the thumb.  You also seem to have some blood underneath the fingernail.  We discussed options here in the emergency department and we agreed on following up closely with Dr. Aline Brochure the hand specialist for further management.  Please take the Augmentin antibiotic as directed.  Use Tylenol or Motrin at home for discomfort.  Use the Norco medication as needed for more significant pain.  We also recommend soaking the thumb in warm water and Epson salt twice a day until you see the specialist.  Please return to the Emergency Department if you experience any worsening of your condition.   Thank you for allowing [place] to be a part of your care.

## 2020-01-05 NOTE — ED Triage Notes (Signed)
Pt reports she was bitten by a stray dog on her left thumb 2 days ago. Pt unsure if dog had rabies vaccination.

## 2020-01-05 NOTE — ED Provider Notes (Signed)
[facility] Emergency Department Provider Note MRN:  [id number]  Arrival date & time: 01/05/20     Chief Complaint   Animal Bite   History of Present Illness   Samantha [name] is a 44 y.o. year-old female with a history of celiac disease presenting to the ED with chief complaint of animal bite.  Patient bit by unknown or unfamiliar dog 2 days ago while walking here in town.  Bit on the left thumb.  No other bite sites or injuries.  Did not fall, no head trauma.  Pain in the left thumb has persisted since that time, thinks that she banged it on the bedpost this morning causing exacerbation of the pain.  Pain is moderate, constant, worse with motion or palpation.  Unknown last tetanus shot, has never received rabies shot.  Review of Systems  A complete 10 system review of systems was obtained and all systems are negative except as noted in the HPI and PMH.   Patient's Health History    Past Medical History:  Diagnosis Date  . Arrhythmia   . Celiac disease   . Chronic pain   . DDD (degenerative disc disease)   . Degenerative disc disease, lumbar   . Interstitial cystitis   . Kidney stones     Past Surgical History:  Procedure Laterality Date  . ABDOMINAL HYSTERECTOMY    . ADENOIDECTOMY    . APPENDECTOMY    . CESAREAN SECTION    . HERNIA REPAIR    . KIDNEY STONE SURGERY    . TONSILLECTOMY      Family History  Problem Relation Age of Onset  . Cancer Mother   . Heart disease Mother     Social History   Socioeconomic History  . Marital status: Divorced    Spouse name: Not on file  . Number of children: Not on file  . Years of education: Not on file  . Highest education level: Not on file  Occupational History  . Not on file  Tobacco Use  . Smoking status: Current Every Day Smoker    Packs/day: 0.50    Types: Cigarettes  . Smokeless tobacco: Never Used  Vaping Use  . Vaping Use: Never used  Substance and Sexual Activity  . Alcohol use: No   . Drug use: No    Types: Marijuana    Comment: last used x 1 1/2 years  . Sexual activity: Yes    Birth control/protection: Surgical  Other Topics Concern  . Not on file  Social History Narrative  . Not on file   Social Determinants of Health   Financial Resource Strain:   . Difficulty of Paying Living Expenses:   Food Insecurity:   . Worried About Charity fundraiser in the Last Year:   . Arboriculturist in the Last Year:   Transportation Needs:   . Film/video editor (Medical):   Marland Kitchen Lack of Transportation (Non-Medical):   Physical Activity:   . Days of Exercise per Week:   . Minutes of Exercise per Session:   Stress:   . Feeling of Stress :   Social Connections:   . Frequency of Communication with Friends and Family:   . Frequency of Social Gatherings with Friends and Family:   . Attends Religious Services:   . Active Member of Clubs or Organizations:   . Attends Archivist Meetings:   Marland Kitchen Marital Status:   Intimate Partner Violence:   . Fear  of Current or Ex-Partner:   . Emotionally Abused:   Marland Kitchen Physically Abused:   . Sexually Abused:      Physical Exam   Vitals:   01/05/20 0456  BP: 113/82  Pulse: 66  Resp: 17  Temp: 97.7 F (36.5 C)  SpO2: 96%    CONSTITUTIONAL: Well-appearing, NAD NEURO:  Alert and oriented x 3, no focal deficits EYES:  eyes equal and reactive ENT/NECK:  no LAD, no JVD CARDIO: Regular rate, well-perfused, normal S1 and S2 PULM:  CTAB no wheezing or rhonchi GI/GU:  normal bowel sounds, non-distended, non-tender MSK/SPINE:  No gross deformities, no edema SKIN: Bruising to the distal left thumb centered around the nailbed, neurovascularly intact distally PSYCH:  Appropriate speech and behavior  *Additional and/or pertinent findings included in MDM below  Diagnostic and Interventional Summary    EKG Interpretation  Date/Time:    Ventricular Rate:    PR Interval:    QRS Duration:   QT Interval:    QTC Calculation:     R Axis:     Text Interpretation:        Labs Reviewed - No data to display  DG Finger Thumb Left  Final Result      Medications  rabies immune globulin (HYPERAB/KEDRAB) injection 1,725 Units (has no administration in time range)  amoxicillin-clavulanate (AUGMENTIN) 875-125 MG per tablet 1 tablet (1 tablet Oral Given 01/05/20 0748)  Tdap (BOOSTRIX) injection 0.5 mL (0.5 mLs Intramuscular Given 01/05/20 0748)  rabies vaccine (RABAVERT) injection 1 mL (1 mL Intramuscular Given 01/05/20 0750)  HYDROcodone-acetaminophen (NORCO/VICODIN) 5-325 MG per tablet 1 tablet (1 tablet Oral Given 01/05/20 0748)     Procedures  /  Critical Care Procedures  ED Course and Medical Decision Making  I have reviewed the triage vital signs, the nursing notes, and pertinent available records from the EMR.  Listed above are laboratory and imaging tests that I personally ordered, reviewed, and interpreted and then considered in my medical decision making (see below for details).  Dog bite, x-ray revealing tuft fracture, question of mild infection versus subungual hematoma on exam, will discuss with hand surgery to see if they would rather follow this up in the office or based on the appearance whether or not they would recommend trephination versus nail removal.  Will update tetanus, will start rabies vaccine series.  Clinical Course as of Jan 05 811  Thu Jan 05, 2020  0740 Discussed case with Dr. Aline Brochure who was able to review the image of the digit, patient has management options, can have the nail removed here in the ED or can follow-up within the next few days in the office with Dr. Aline Brochure.  Patient prefers the latter option, will provide Tdap, rabies shots, discharge thereafter.   [MB]    Clinical Course User Index [MB] Sedonia Small Barth Kirks, MD     Barth Kirks. Sedonia Small, [place] [email or web address]  Final Clinical Impressions(s) / ED Diagnoses      ICD-10-CM   1. Dog bite, initial encounter  W54.0XXA   2. Subungual hematoma of digit of hand, initial encounter  S60.10XA   3. Closed fracture of tuft of distal phalanx of thumb  S62.523A     ED Discharge Orders         Ordered    amoxicillin-clavulanate (AUGMENTIN) 875-125 MG tablet  Every 12 hours     Discontinue  Reprint     01/05/20 0810    HYDROcodone-acetaminophen (NORCO/VICODIN) 5-325  MG tablet  Every 4 hours PRN     Discontinue  Reprint     01/05/20 0810           Discharge Instructions Discussed with and Provided to Patient:     Discharge Instructions     You were evaluated in the Emergency Department and after careful evaluation, we did not find any emergent condition requiring admission or further testing in the hospital.  Your x-ray shows a broken bone at the tip of the thumb.  You also seem to have some blood underneath the fingernail.  We discussed options here in the emergency department and we agreed on following up closely with Dr. Aline Brochure the hand specialist for further management.  Please take the Augmentin antibiotic as directed.  Use Tylenol or Motrin at home for discomfort.  Use the Norco medication as needed for more significant pain.  We also recommend soaking the thumb in warm water and Epson salt twice a day until you see the specialist.  Please return to the Emergency Department if you experience any worsening of your condition.   Thank you for allowing [place] to be a part of your care.       Maudie Flakes, MD 01/05/20 [id number]

## 2020-01-06 ENCOUNTER — Telehealth: Payer: Self-pay | Admitting: *Deleted

## 2020-01-06 NOTE — Telephone Encounter (Signed)
Contacted pt to complete transition of care assessment:  Transition Care Management Follow-up Telephone Call  . Medicaid Managed Care Transition Call Status:MM [facility] Call Made  . Date of discharge and from where: [facility], 01/05/20  . How have you been since you were released from the hospital? "ok" . Any questions or concerns? No  Items Reviewed: Marland Kitchen Did the pt receive and understand the discharge instructions provided? Yes  . Medications obtained and verified? Yes  . Any new allergies since your discharge? No  . Dietary orders reviewed? n/a  . Do you have support at home? Yes, family  Functional Questionnaire: (I = Independent and D = Dependent)  ADLs: Independent Bathing/Dressing:Independent Meal Prep: Independent Eating: Independent Maintaining continence: Independent Transferring/Ambulation: Independent Managing Meds: Independent   Follow up appointments reviewed:  PCP Hospital f/u appt confirmed? No  Patient would like to be assigned a PCP  [facility] f/u appt confirmed? No, patient to call Dr Aline Brochure, Orthopedics, on 01/06/20 to schedule follow up appt  Are transportation arrangements needed? No   If their condition worsens, is the pt aware to call PCP or go to the EmergencyDept.? yes Was the patient provided with contact information for the PCP's office or ED? yes  Was to pt encouraged to call back with questions or concerns? Yes  Lenor Coffin, RN, BSN, [facility]anna Patient [place] [phone]

## 2020-01-09 ENCOUNTER — Encounter: Payer: Self-pay | Admitting: Orthopedic Surgery

## 2020-01-09 ENCOUNTER — Other Ambulatory Visit: Payer: Self-pay

## 2020-01-09 ENCOUNTER — Other Ambulatory Visit: Payer: Self-pay | Admitting: Radiology

## 2020-01-09 ENCOUNTER — Ambulatory Visit: Payer: Medicaid Other | Admitting: Orthopedic Surgery

## 2020-01-09 VITALS — BP 132/75 | HR 67 | Ht 61.0 in | Wt 209.0 lb

## 2020-01-09 DIAGNOSIS — S62525B Nondisplaced fracture of distal phalanx of left thumb, initial encounter for open fracture: Secondary | ICD-10-CM

## 2020-01-09 DIAGNOSIS — W540XXA Bitten by dog, initial encounter: Secondary | ICD-10-CM | POA: Diagnosis not present

## 2020-01-09 MED ORDER — AMOXICILLIN-POT CLAVULANATE 875-125 MG PO TABS: 1.0000 | ORAL_TABLET | Freq: Two times a day (BID) | ORAL | 0 refills | Status: AC

## 2020-01-09 MED ORDER — HYDROCODONE-ACETAMINOPHEN 5-325 MG PO TABS: 1.0000 | ORAL_TABLET | Freq: Every evening | ORAL | 0 refills | Status: DC | PRN

## 2020-01-09 NOTE — Patient Instructions (Signed)
Continue soaking the finger   Continue antibiotics

## 2020-01-09 NOTE — Telephone Encounter (Signed)
Had Hydrocodone in ER would like refill if possible Takes at bedtime

## 2020-01-09 NOTE — Progress Notes (Signed)
NEW PROBLEM//OFFICE VISIT  Chief Complaint  Patient presents with   Hand Pain    Left thumb pain and reports she was told she broke a bone but is not sure which one from a dog bite.     44 year old female seen in the emergency room on August 12 for a stray dog bite. She is left-hand dominant the dog bit her left thumb she sustained a fracture to the distal phalanx of the left thumb was put on Augmentin and given appropriate rabies vaccines and injections  She works as a Optician, dispensing complains of pain and throbbing in her left thumb     Review of Systems  Constitutional: Negative for chills and fever.  Neurological: Negative for tingling.     Past Medical History:  Diagnosis Date   Arrhythmia    Celiac disease    Chronic pain    DDD (degenerative disc disease)    Degenerative disc disease, lumbar    Interstitial cystitis    Kidney stones     Past Surgical History:  Procedure Laterality Date   ABDOMINAL HYSTERECTOMY     ADENOIDECTOMY     APPENDECTOMY     CESAREAN SECTION     HERNIA REPAIR     KIDNEY STONE SURGERY     TONSILLECTOMY      Family History  Problem Relation Age of Onset   Cancer Mother    Heart disease Mother    Social History   Tobacco Use   Smoking status: Current Every Day Smoker    Packs/day: 0.50    Types: Cigarettes   Smokeless tobacco: Never Used  Vaping Use   Vaping Use: Never used  Substance Use Topics   Alcohol use: No   Drug use: No    Types: Marijuana    Comment: last used x 1 1/2 years    Allergies  Allergen Reactions   Gluten Other (See Comments)    Patient has condition that eliminates any Gluten intake   Naproxen     abd pain     Tramadol Nausea And Vomiting    Current Meds  Medication Sig   acetaminophen (TYLENOL) 500 MG tablet Take 1,000 mg by mouth every 6 (six) hours as needed.   amoxicillin-clavulanate (AUGMENTIN) 875-125 MG tablet Take 1 tablet by mouth every 12 (twelve)  hours for 7 days.   citalopram (CELEXA) 20 MG tablet Take by mouth.   diclofenac Sodium (VOLTAREN) 1 % GEL Apply 2 g topically 4 (four) times daily.   ibuprofen (ADVIL) 800 MG tablet Take 1 tablet (800 mg total) by mouth every 8 (eight) hours as needed.   [DISCONTINUED] amoxicillin-clavulanate (AUGMENTIN) 875-125 MG tablet Take 1 tablet by mouth every 12 (twelve) hours for 7 days.    BP 132/75    Pulse 67    Ht 5\' 1"  (1.549 m)    Wt 209 lb (94.8 kg)    BMI 39.49 kg/m   Physical Exam Constitutional:      General: She is not in acute distress.    Appearance: She is well-developed.  Cardiovascular:     Comments: No peripheral edema Skin:    General: Skin is warm and dry.  Neurological:     Mental Status: She is alert and oriented to person, place, and time.     Sensory: No sensory deficit.     Coordination: Coordination normal.     Gait: Gait normal.     Deep Tendon Reflexes: Reflexes are normal and symmetric.  Ortho Exam Subungual hematoma left thumb incorporates approximately two thirds of the nail area which is still intact. The skin is wrinkling well there is no tension in the soft tissues painful range of motion of the IP joint tenderness around the thumb and fracture site distal sensation is intact color capillary refill normal   MEDICAL DECISION MAKING  A.  Encounter Diagnosis  Name Primary?   Open nondisplaced fracture of distal phalanx of left thumb, initial encounter Yes    B. DATA ANALYSED:  IMAGING: Independent interpretation of images: The x-ray at the hospital shows a transverse fracture comminuted distal phalanx left thumb  Orders: NO  Outside records reviewed: ER record  C. MANAGEMENT   Continue soaking twice a day refill antibiotic cautions given about signs of infection come back in a week check wound and thumb patient says she would like to try to continue to work through this injury  Meds ordered this encounter  Medications    amoxicillin-clavulanate (AUGMENTIN) 875-125 MG tablet    Sig: Take 1 tablet by mouth every 12 (twelve) hours for 7 days.    Dispense:  14 tablet    Refill:  0      Arther Abbott, MD  01/09/2020 9:47 AM

## 2020-01-11 ENCOUNTER — Telehealth: Payer: Self-pay | Admitting: *Deleted

## 2020-01-11 NOTE — Telephone Encounter (Signed)
Emailed patients information to Roy at [facility]- Primary to schedule NP appointment.                                    Soma Lizak                                       PEC                                   [phone]

## 2020-01-16 ENCOUNTER — Encounter: Payer: Self-pay | Admitting: Orthopedic Surgery

## 2020-01-16 ENCOUNTER — Other Ambulatory Visit: Payer: Self-pay

## 2020-01-16 ENCOUNTER — Ambulatory Visit (INDEPENDENT_AMBULATORY_CARE_PROVIDER_SITE_OTHER): Payer: Medicaid Other | Admitting: Orthopedic Surgery

## 2020-01-16 VITALS — BP 124/84 | HR 82 | Ht 61.0 in | Wt 209.0 lb

## 2020-01-16 DIAGNOSIS — S62525B Nondisplaced fracture of distal phalanx of left thumb, initial encounter for open fracture: Secondary | ICD-10-CM

## 2020-01-16 DIAGNOSIS — S62525D Nondisplaced fracture of distal phalanx of left thumb, subsequent encounter for fracture with routine healing: Secondary | ICD-10-CM

## 2020-01-16 MED ORDER — HYDROCODONE-ACETAMINOPHEN 5-325 MG PO TABS: 1.0000 | ORAL_TABLET | Freq: Every evening | ORAL | 0 refills | Status: AC | PRN

## 2020-01-16 MED ORDER — IBUPROFEN 800 MG PO TABS: 800.0000 mg | ORAL_TABLET | Freq: Three times a day (TID) | ORAL | 0 refills | Status: DC | PRN

## 2020-01-16 NOTE — Progress Notes (Signed)
Chief Complaint  Patient presents with  . Hand Pain    left thumb pain 6/10 pain level, she has 2 ABT left.   frx care   Samantha [name] continues to complain of pain in the left thumb with some swelling although that is decreasing she also complains of decreased range of motion at the IP joint  Nail is coming off slowly  Recommend hydrocodone for severe pain ibuprofen for mild pain active range of motion exercises  Follow-up 4 weeks  Encounter Diagnosis  Name Primary?  . Open nondisplaced fracture of distal phalanx of left thumb, initial encounter Yes

## 2020-01-16 NOTE — Patient Instructions (Signed)
Soak it   Take ibuprofen  Mild pain   Hydrocodone   Severe pain

## 2020-01-23 DIAGNOSIS — M129 Arthropathy, unspecified: Secondary | ICD-10-CM | POA: Diagnosis not present

## 2020-01-23 DIAGNOSIS — Z79899 Other long term (current) drug therapy: Secondary | ICD-10-CM | POA: Diagnosis not present

## 2020-01-23 DIAGNOSIS — E559 Vitamin D deficiency, unspecified: Secondary | ICD-10-CM | POA: Diagnosis not present

## 2020-02-01 DIAGNOSIS — F411 Generalized anxiety disorder: Secondary | ICD-10-CM | POA: Diagnosis not present

## 2020-02-01 DIAGNOSIS — G4700 Insomnia, unspecified: Secondary | ICD-10-CM | POA: Diagnosis not present

## 2020-02-01 DIAGNOSIS — F410 Panic disorder [episodic paroxysmal anxiety] without agoraphobia: Secondary | ICD-10-CM | POA: Diagnosis not present

## 2020-02-01 DIAGNOSIS — F332 Major depressive disorder, recurrent severe without psychotic features: Secondary | ICD-10-CM | POA: Diagnosis not present

## 2020-02-09 DIAGNOSIS — R002 Palpitations: Secondary | ICD-10-CM | POA: Diagnosis not present

## 2020-02-15 ENCOUNTER — Ambulatory Visit: Payer: Medicaid Other | Admitting: Orthopedic Surgery

## 2020-02-15 ENCOUNTER — Encounter: Payer: Self-pay | Admitting: Orthopedic Surgery

## 2020-02-20 DIAGNOSIS — R2243 Localized swelling, mass and lump, lower limb, bilateral: Secondary | ICD-10-CM | POA: Diagnosis not present

## 2020-02-27 ENCOUNTER — Ambulatory Visit (HOSPITAL_COMMUNITY): Payer: Medicaid Other | Admitting: Physical Therapy

## 2020-03-05 ENCOUNTER — Encounter (HOSPITAL_COMMUNITY): Payer: Self-pay

## 2020-03-05 ENCOUNTER — Ambulatory Visit (HOSPITAL_COMMUNITY): Payer: Medicaid Other | Attending: Nurse Practitioner | Admitting: Physical Therapy

## 2020-03-06 DIAGNOSIS — Z79899 Other long term (current) drug therapy: Secondary | ICD-10-CM | POA: Diagnosis not present

## 2020-03-07 DIAGNOSIS — F410 Panic disorder [episodic paroxysmal anxiety] without agoraphobia: Secondary | ICD-10-CM | POA: Diagnosis not present

## 2020-03-07 DIAGNOSIS — F319 Bipolar disorder, unspecified: Secondary | ICD-10-CM | POA: Diagnosis not present

## 2020-03-07 DIAGNOSIS — G4700 Insomnia, unspecified: Secondary | ICD-10-CM | POA: Diagnosis not present

## 2020-03-07 DIAGNOSIS — Z720 Tobacco use: Secondary | ICD-10-CM | POA: Diagnosis not present

## 2020-03-07 DIAGNOSIS — F411 Generalized anxiety disorder: Secondary | ICD-10-CM | POA: Diagnosis not present

## 2020-04-05 DIAGNOSIS — Z79899 Other long term (current) drug therapy: Secondary | ICD-10-CM | POA: Diagnosis not present

## 2020-04-11 ENCOUNTER — Ambulatory Visit: Payer: Medicaid Other | Admitting: Internal Medicine

## 2020-04-11 NOTE — Progress Notes (Deleted)
Office Visit Note  Patient: Samantha [name]             Date of Birth: 07-02-1975           MRN: [id number]             PCP: Riki Sheer, NP Referring: Riki Sheer, NP Visit Date: 04/11/2020 Occupation: @GUAROCC @  Subjective:  No chief complaint on file.   History of Present Illness: Samantha [name] is a 44 y.o. female with a history of celiac disease, interstitial cystitis, vitamin D deficiency and generalized anxiety here for evaluation of positive ANA with joint pain of multiple sites especially her back and lower extremities.***  Labs reviewed ANA positive dsDNA 1 Smith negative RNP 3.1 SSA negative SSB negative Uric acid 6.0 RF 13 CCP 3.1 CRP 2  CBC normal CMP normal Vitamin D 10.72  Imaging reviewed 02/2020 [place] lower extremities b/l No evidence of DVT or peripheral arterial disease, normal ABIs   Activities of Daily Living:  Patient reports morning stiffness for *** {minute/hour:19697}.   Patient {ACTIONS;DENIES/REPORTS:21021675::"Denies"} nocturnal pain.  Difficulty dressing/grooming: {ACTIONS;DENIES/REPORTS:21021675::"Denies"} Difficulty climbing stairs: {ACTIONS;DENIES/REPORTS:21021675::"Denies"} Difficulty getting out of chair: {ACTIONS;DENIES/REPORTS:21021675::"Denies"} Difficulty using hands for taps, buttons, cutlery, and/or writing: {ACTIONS;DENIES/REPORTS:21021675::"Denies"}  No Rheumatology ROS completed.   PMFS History:  Patient Active Problem List   Diagnosis Date Noted  . Suicidal risk 02/04/2013  . Depression 02/01/2013  . Panic disorder 02/01/2013  . PTSD (post-traumatic stress disorder) 02/01/2013  . ANXIETY DISORDER 12/05/2008  . CELIAC DISEASE 12/05/2008  . OVARIAN CYST 12/05/2008  . LOW BACK PAIN, CHRONIC 12/05/2008  . ABDOMINAL PAIN, CHRONIC 12/05/2008  . SUPRAVENTRICULAR TACHYCARDIA, HX OF 12/05/2008    Past Medical History:  Diagnosis Date  . Arrhythmia   . Celiac disease   . Chronic pain   . DDD (degenerative  disc disease)   . Degenerative disc disease, lumbar   . Interstitial cystitis   . Kidney stones     Family History  Problem Relation Age of Onset  . Cancer Mother   . Heart disease Mother    Past Surgical History:  Procedure Laterality Date  . ABDOMINAL HYSTERECTOMY    . ADENOIDECTOMY    . APPENDECTOMY    . CESAREAN SECTION    . HERNIA REPAIR    . KIDNEY STONE SURGERY    . TONSILLECTOMY     Social History   Social History Narrative  . Not on file   Immunization History  Administered Date(s) Administered  . Influenza,inj,Quad PF,6+ Mos 02/16/2013  . Pneumococcal Polysaccharide-23 05/28/2010  . Rabies, IM 01/05/2020  . Td 12/28/2004  . Tdap 10/17/2011, 01/05/2020     Objective: Vital Signs: There were no vitals taken for this visit.   Physical Exam   Musculoskeletal Exam: ***  CDAI Exam: CDAI Score: -- Patient Global: --; Provider Global: -- Swollen: --; Tender: -- Joint Exam 04/11/2020   No joint exam has been documented for this visit   There is currently no information documented on the homunculus. Go to the Rheumatology activity and complete the homunculus joint exam.  Investigation: No additional findings.  Imaging: No results found.  Recent Labs: Lab Results  Component Value Date   WBC 6.4 04/15/2014   HGB 13.7 04/15/2014   PLT 256 04/15/2014   NA 141 04/15/2014   K 3.7 04/15/2014   CL 102 04/15/2014   CO2 25 04/15/2014   GLUCOSE 90 04/15/2014   BUN 12 04/15/2014   CREATININE 0.74 04/15/2014  BILITOT <0.2 (L) 04/15/2014   ALKPHOS 69 04/15/2014   AST 12 04/15/2014   ALT 14 04/15/2014   PROT 7.2 04/15/2014   ALBUMIN 4.0 04/15/2014   CALCIUM 9.2 04/15/2014   GFRAA >90 04/15/2014    Speciality Comments: No specialty comments available.  Procedures:  No procedures performed Allergies: Gluten, Naproxen, and Tramadol   Assessment / Plan:     Visit Diagnoses: No diagnosis found.  Orders: No orders of the defined types were placed  in this encounter.  No orders of the defined types were placed in this encounter.   Face-to-face time spent with patient was *** minutes. Greater than 50% of time was spent in counseling and coordination of care.  Follow-Up Instructions: No follow-ups on file.   Collier Salina, MD  Note - This record has been created using [employer].  Chart creation errors have been sought, but may not always  have been located. Such creation errors do not reflect on  the standard of medical care.

## 2020-05-02 DIAGNOSIS — F410 Panic disorder [episodic paroxysmal anxiety] without agoraphobia: Secondary | ICD-10-CM | POA: Diagnosis not present

## 2020-05-02 DIAGNOSIS — Z720 Tobacco use: Secondary | ICD-10-CM | POA: Diagnosis not present

## 2020-05-02 DIAGNOSIS — G4700 Insomnia, unspecified: Secondary | ICD-10-CM | POA: Diagnosis not present

## 2020-05-02 DIAGNOSIS — F411 Generalized anxiety disorder: Secondary | ICD-10-CM | POA: Diagnosis not present

## 2020-05-02 DIAGNOSIS — F319 Bipolar disorder, unspecified: Secondary | ICD-10-CM | POA: Diagnosis not present

## 2020-05-03 DIAGNOSIS — Z79899 Other long term (current) drug therapy: Secondary | ICD-10-CM | POA: Diagnosis not present

## 2020-06-06 DIAGNOSIS — E559 Vitamin D deficiency, unspecified: Secondary | ICD-10-CM | POA: Diagnosis not present

## 2020-06-06 DIAGNOSIS — Z6838 Body mass index (BMI) 38.0-38.9, adult: Secondary | ICD-10-CM | POA: Diagnosis not present

## 2020-06-06 DIAGNOSIS — F17200 Nicotine dependence, unspecified, uncomplicated: Secondary | ICD-10-CM | POA: Diagnosis not present

## 2020-06-06 DIAGNOSIS — R768 Other specified abnormal immunological findings in serum: Secondary | ICD-10-CM | POA: Diagnosis not present

## 2020-06-06 DIAGNOSIS — F17210 Nicotine dependence, cigarettes, uncomplicated: Secondary | ICD-10-CM | POA: Diagnosis not present

## 2020-06-06 DIAGNOSIS — Z79899 Other long term (current) drug therapy: Secondary | ICD-10-CM | POA: Diagnosis not present

## 2020-06-06 DIAGNOSIS — Z760 Encounter for issue of repeat prescription: Secondary | ICD-10-CM | POA: Diagnosis not present

## 2020-06-20 DIAGNOSIS — Z79899 Other long term (current) drug therapy: Secondary | ICD-10-CM | POA: Diagnosis not present

## 2020-06-20 DIAGNOSIS — R768 Other specified abnormal immunological findings in serum: Secondary | ICD-10-CM | POA: Diagnosis not present

## 2020-06-20 DIAGNOSIS — M25529 Pain in unspecified elbow: Secondary | ICD-10-CM | POA: Diagnosis not present

## 2020-06-20 DIAGNOSIS — F17200 Nicotine dependence, unspecified, uncomplicated: Secondary | ICD-10-CM | POA: Diagnosis not present

## 2020-06-20 DIAGNOSIS — G894 Chronic pain syndrome: Secondary | ICD-10-CM | POA: Diagnosis not present

## 2020-06-20 DIAGNOSIS — F17210 Nicotine dependence, cigarettes, uncomplicated: Secondary | ICD-10-CM | POA: Diagnosis not present

## 2020-06-20 DIAGNOSIS — G8929 Other chronic pain: Secondary | ICD-10-CM | POA: Diagnosis not present

## 2020-06-20 DIAGNOSIS — Z6838 Body mass index (BMI) 38.0-38.9, adult: Secondary | ICD-10-CM | POA: Diagnosis not present

## 2020-07-04 DIAGNOSIS — Z79899 Other long term (current) drug therapy: Secondary | ICD-10-CM | POA: Diagnosis not present

## 2020-07-04 DIAGNOSIS — G894 Chronic pain syndrome: Secondary | ICD-10-CM | POA: Diagnosis not present

## 2020-07-04 DIAGNOSIS — G4700 Insomnia, unspecified: Secondary | ICD-10-CM | POA: Diagnosis not present

## 2020-07-04 DIAGNOSIS — M5416 Radiculopathy, lumbar region: Secondary | ICD-10-CM | POA: Diagnosis not present

## 2020-07-04 DIAGNOSIS — M62830 Muscle spasm of back: Secondary | ICD-10-CM | POA: Diagnosis not present

## 2020-07-04 DIAGNOSIS — F17200 Nicotine dependence, unspecified, uncomplicated: Secondary | ICD-10-CM | POA: Diagnosis not present

## 2020-07-04 DIAGNOSIS — Z6838 Body mass index (BMI) 38.0-38.9, adult: Secondary | ICD-10-CM | POA: Diagnosis not present

## 2020-07-04 DIAGNOSIS — F17210 Nicotine dependence, cigarettes, uncomplicated: Secondary | ICD-10-CM | POA: Diagnosis not present

## 2020-08-01 DIAGNOSIS — F17200 Nicotine dependence, unspecified, uncomplicated: Secondary | ICD-10-CM | POA: Diagnosis not present

## 2020-08-01 DIAGNOSIS — M62830 Muscle spasm of back: Secondary | ICD-10-CM | POA: Diagnosis not present

## 2020-08-01 DIAGNOSIS — Z20822 Contact with and (suspected) exposure to covid-19: Secondary | ICD-10-CM | POA: Diagnosis not present

## 2020-08-01 DIAGNOSIS — M5416 Radiculopathy, lumbar region: Secondary | ICD-10-CM | POA: Diagnosis not present

## 2020-08-01 DIAGNOSIS — Z79899 Other long term (current) drug therapy: Secondary | ICD-10-CM | POA: Diagnosis not present

## 2020-08-01 DIAGNOSIS — M7989 Other specified soft tissue disorders: Secondary | ICD-10-CM | POA: Diagnosis not present

## 2020-08-01 DIAGNOSIS — F17210 Nicotine dependence, cigarettes, uncomplicated: Secondary | ICD-10-CM | POA: Diagnosis not present

## 2020-08-01 DIAGNOSIS — E559 Vitamin D deficiency, unspecified: Secondary | ICD-10-CM | POA: Diagnosis not present

## 2020-08-01 DIAGNOSIS — G894 Chronic pain syndrome: Secondary | ICD-10-CM | POA: Diagnosis not present

## 2020-08-14 DIAGNOSIS — M62830 Muscle spasm of back: Secondary | ICD-10-CM | POA: Diagnosis not present

## 2020-08-14 DIAGNOSIS — Z79899 Other long term (current) drug therapy: Secondary | ICD-10-CM | POA: Diagnosis not present

## 2020-08-14 DIAGNOSIS — F17210 Nicotine dependence, cigarettes, uncomplicated: Secondary | ICD-10-CM | POA: Diagnosis not present

## 2020-08-14 DIAGNOSIS — M5416 Radiculopathy, lumbar region: Secondary | ICD-10-CM | POA: Diagnosis not present

## 2020-08-14 DIAGNOSIS — F17200 Nicotine dependence, unspecified, uncomplicated: Secondary | ICD-10-CM | POA: Diagnosis not present

## 2020-08-14 DIAGNOSIS — Z6837 Body mass index (BMI) 37.0-37.9, adult: Secondary | ICD-10-CM | POA: Diagnosis not present

## 2020-08-14 DIAGNOSIS — B182 Chronic viral hepatitis C: Secondary | ICD-10-CM | POA: Diagnosis not present

## 2020-09-11 DIAGNOSIS — Z6835 Body mass index (BMI) 35.0-35.9, adult: Secondary | ICD-10-CM | POA: Diagnosis not present

## 2020-09-11 DIAGNOSIS — F17210 Nicotine dependence, cigarettes, uncomplicated: Secondary | ICD-10-CM | POA: Diagnosis not present

## 2020-09-11 DIAGNOSIS — M62830 Muscle spasm of back: Secondary | ICD-10-CM | POA: Diagnosis not present

## 2020-09-11 DIAGNOSIS — M5416 Radiculopathy, lumbar region: Secondary | ICD-10-CM | POA: Diagnosis not present

## 2020-09-11 DIAGNOSIS — F17200 Nicotine dependence, unspecified, uncomplicated: Secondary | ICD-10-CM | POA: Diagnosis not present

## 2020-09-11 DIAGNOSIS — B182 Chronic viral hepatitis C: Secondary | ICD-10-CM | POA: Diagnosis not present

## 2020-09-11 DIAGNOSIS — Z79899 Other long term (current) drug therapy: Secondary | ICD-10-CM | POA: Diagnosis not present

## 2020-09-16 ENCOUNTER — Other Ambulatory Visit: Payer: Self-pay

## 2020-09-16 ENCOUNTER — Emergency Department (HOSPITAL_COMMUNITY)
Admission: EM | Admit: 2020-09-16 | Discharge: 2020-09-16 | Disposition: A | Discharge status: Home / Self Care | Payer: Medicaid Other | Attending: Emergency Medicine | Admitting: Emergency Medicine

## 2020-09-16 ENCOUNTER — Encounter (HOSPITAL_COMMUNITY): Payer: Self-pay | Admitting: Emergency Medicine

## 2020-09-16 DIAGNOSIS — M79641 Pain in right hand: Secondary | ICD-10-CM | POA: Insufficient documentation

## 2020-09-16 DIAGNOSIS — M79642 Pain in left hand: Secondary | ICD-10-CM | POA: Diagnosis not present

## 2020-09-16 DIAGNOSIS — R21 Rash and other nonspecific skin eruption: Secondary | ICD-10-CM | POA: Insufficient documentation

## 2020-09-16 DIAGNOSIS — F17210 Nicotine dependence, cigarettes, uncomplicated: Secondary | ICD-10-CM | POA: Insufficient documentation

## 2020-09-16 LAB — CBG MONITORING, ED: Glucose-Capillary: 124 mg/dL — ABNORMAL HIGH (ref 70–99)

## 2020-09-16 MED ORDER — HYDROCORTISONE 1 % EX CREA: TOPICAL_CREAM | CUTANEOUS | 0 refills | Status: DC

## 2020-09-16 NOTE — ED Provider Notes (Signed)
[facility] EMERGENCY DEPARTMENT Provider Note   CSN: [id number] Arrival date & time: 09/16/20  0436     History Chief Complaint  Patient presents with  . Hand Pain    Samantha [name] is a 45 y.o. female.  Patient presents with bilateral hand pain and swelling and redness ongoing for the past 2 days.  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  Describes diffuse pain and swelling to her bilateral hands with pain and difficulty using them.  Denies any fall or injury.  States she wears gloves at work but they are not new.  Denies feeling any chemicals on her hands.  Denies any new exposures to medications or environmental factors.  Denies any difficulty breathing or difficulty swallowing.  No chest pain or shortness of breath.  No cough or fever.  No known allergies. States the gloves are not latex. No birth control use  The history is provided by the patient.       Past Medical History:  Diagnosis Date  . Arrhythmia   . Celiac disease   . Chronic pain   . DDD (degenerative disc disease)   . Degenerative disc disease, lumbar   . Interstitial cystitis   . Kidney stones     Patient Active Problem List   Diagnosis Date Noted  . Suicidal risk 02/04/2013  . Depression 02/01/2013  . Panic disorder 02/01/2013  . PTSD (post-traumatic stress disorder) 02/01/2013  . ANXIETY DISORDER 12/05/2008  . CELIAC DISEASE 12/05/2008  . OVARIAN CYST 12/05/2008  . LOW BACK PAIN, CHRONIC 12/05/2008  . ABDOMINAL PAIN, CHRONIC 12/05/2008  . SUPRAVENTRICULAR TACHYCARDIA, HX OF 12/05/2008    Past Surgical History:  Procedure Laterality Date  . ABDOMINAL HYSTERECTOMY    . ADENOIDECTOMY    . APPENDECTOMY    . CESAREAN SECTION    . HERNIA REPAIR    . KIDNEY STONE SURGERY    . TONSILLECTOMY       OB History   No obstetric history on file.     Family History  Problem  Relation Age of Onset  . Cancer Mother   . Heart disease Mother     Social History   Tobacco Use  . Smoking status: Current Every Day Smoker    Packs/day: 0.50    Types: Cigarettes  . Smokeless tobacco: Never Used  Vaping Use  . Vaping Use: Never used  Substance Use Topics  . Alcohol use: No  . Drug use: No    Types: Marijuana    Comment: last used x 1 1/2 years    Home Medications Prior to Admission medications   Medication Sig Start Date End Date Taking? Authorizing Provider  acetaminophen (TYLENOL) 500 MG tablet Take 1,000 mg by mouth every 6 (six) hours as needed.    [provider]  citalopram (CELEXA) 20 MG tablet Take by mouth. 04/12/18   [provider]  diclofenac Sodium (VOLTAREN) 1 % GEL Apply 2 g topically 4 (four) times daily. 10/23/19   Joy, Shawn C, PA-C  ibuprofen (ADVIL) 800 MG tablet Take 1 tablet (800 mg total) by mouth every 8 (eight) hours as needed. 01/16/20   Carole Civil, MD    Allergies    Gluten, Naproxen, and Tramadol  Review of Systems   Review of Systems  Constitutional: Negative for activity change, appetite change and  fever.  HENT: Negative for congestion and rhinorrhea.   Respiratory: Negative for choking and shortness of breath.   Cardiovascular: Negative for chest pain.  Gastrointestinal: Negative for abdominal pain, nausea and vomiting.  Genitourinary: Negative for dysuria and hematuria.  Musculoskeletal: Positive for arthralgias and myalgias.  Skin: Positive for rash.  Neurological: Negative for dizziness and headaches.   all other systems are negative except as noted in the HPI and PMH.    Physical Exam Updated Vital Signs BP 107/71 (BP Location: Left Arm)   Pulse 73   Temp 98.6 F (37 C) (Oral)   Resp 18   Ht 5\' 1"  (1.549 m)   Wt 95 kg   SpO2 99%   BMI 39.57 kg/m   Physical Exam Vitals and nursing note reviewed.  Constitutional:      General: She is not in acute distress.    Appearance: She is  well-developed.  HENT:     Head: Normocephalic and atraumatic.     Mouth/Throat:     Pharynx: No oropharyngeal exudate.  Eyes:     Conjunctiva/sclera: Conjunctivae normal.     Pupils: Pupils are equal, round, and reactive to light.  Neck:     Comments: No meningismus. Cardiovascular:     Rate and Rhythm: Normal rate and regular rhythm.     Heart sounds: Normal heart sounds. No murmur heard.   Pulmonary:     Effort: Pulmonary effort is normal. No respiratory distress.     Breath sounds: Normal breath sounds.  Abdominal:     Palpations: Abdomen is soft.     Tenderness: There is no abdominal tenderness. There is no guarding or rebound.  Musculoskeletal:        General: Tenderness present.     Cervical back: Normal range of motion and neck supple.     Comments: Mild diffuse swelling to hands bilaterally.  Mild diffuse erythema.  Full range of motion of all major joints but painful.  Able to range wrist without difficulty. Intact radial pulses bilaterally.  Skin:    General: Skin is warm.     Findings: Rash present.  Neurological:     Mental Status: She is alert and oriented to person, place, and time.     Cranial Nerves: No cranial nerve deficit.     Motor: No abnormal muscle tone.     Coordination: Coordination normal.     Comments: No ataxia on finger to nose bilaterally. No pronator drift. 5/5 strength throughout. CN 2-12 intact.Equal grip strength. Sensation intact.   Psychiatric:        Behavior: Behavior normal.     ED Results / Procedures / Treatments   Labs (all labs ordered are listed, but only abnormal results are displayed) Labs Reviewed - No data to display  EKG None  Radiology No results found.  Procedures Procedures   Medications Ordered in ED Medications - No data to display  ED Course  I have reviewed the triage vital signs and the nursing notes.  Pertinent labs & imaging results that were available during my care of the patient were reviewed by  me and considered in my medical decision making (see chart for details).    MDM Rules/Calculators/A&P                         Redness and swelling to hands bilaterally.  Reduced range of motion due to pain.  Pulses intact.  Suspicious for possible environmental exposure versus allergic  reaction.  Discussed discontinuation of the brand of gloves she is using and to switch brands.  Will treat with topical steroids, work note, switch brand of gloves. Low suspicion for septic joint, cellulitis, DVT.  Return precautions discussed Final Clinical Impression(s) / ED Diagnoses Final diagnoses:  Pain in both hands    Rx / DC Orders ED Discharge Orders    None       Gabrian Hoque, Annie Main, MD 09/16/20 [phone]

## 2020-09-16 NOTE — Discharge Instructions (Signed)
Use the steroid cream as prescribed.  Switch your brand of gloves to a different brand.  Follow-up with your doctor.  Return to the ED with worsening symptoms including difficulty breathing, difficulty swallowing, chest pain or any other concerns

## 2020-09-16 NOTE — ED Triage Notes (Signed)
Pt with c/o pain and swelling to both hands bilaterally that she states started when she got off work last night @ 1930.

## 2020-09-17 ENCOUNTER — Telehealth: Payer: Self-pay

## 2020-09-17 NOTE — Telephone Encounter (Signed)
Transition Care Management Follow-up Telephone Call  Date of discharge and from where: 09/16/2020 from [facility]  How have you been since you were released from the hospital? Pt states that she is still having swelling and pain in both hand.   Any questions or concerns? No  Items Reviewed:  Did the pt receive and understand the discharge instructions provided? Yes   Medications obtained and verified? Yes   Other? No   Any new allergies since your discharge? No   Dietary orders reviewed? n/a  Do you have support at home? Yes   Functional Questionnaire: (I = Independent and D = Dependent) ADLs: I  Bathing/Dressing- I  Meal Prep- I  Eating- I  Maintaining continence- I  Transferring/Ambulation- I  Managing Meds- I   Follow up appointments reviewed:   PCP Hospital f/u appt confirmed? No  Pt advised to schedule an appt with PCP.   [facility] f/u appt confirmed? No    Are transportation arrangements needed? No   If their condition worsens, is the pt aware to call PCP or go to the Emergency Dept.? Yes  Was the patient provided with contact information for the PCP's office or ED? Yes  Was to pt encouraged to call back with questions or concerns? Yes

## 2020-10-06 DIAGNOSIS — Z79899 Other long term (current) drug therapy: Secondary | ICD-10-CM | POA: Diagnosis not present

## 2020-11-06 DIAGNOSIS — Z79899 Other long term (current) drug therapy: Secondary | ICD-10-CM | POA: Diagnosis not present

## 2020-11-06 DIAGNOSIS — M5416 Radiculopathy, lumbar region: Secondary | ICD-10-CM | POA: Diagnosis not present

## 2020-11-06 DIAGNOSIS — F17200 Nicotine dependence, unspecified, uncomplicated: Secondary | ICD-10-CM | POA: Diagnosis not present

## 2020-11-06 DIAGNOSIS — F17210 Nicotine dependence, cigarettes, uncomplicated: Secondary | ICD-10-CM | POA: Diagnosis not present

## 2020-11-06 DIAGNOSIS — M62830 Muscle spasm of back: Secondary | ICD-10-CM | POA: Diagnosis not present

## 2020-11-06 DIAGNOSIS — B354 Tinea corporis: Secondary | ICD-10-CM | POA: Diagnosis not present

## 2020-11-06 DIAGNOSIS — Z6834 Body mass index (BMI) 34.0-34.9, adult: Secondary | ICD-10-CM | POA: Diagnosis not present

## 2020-11-10 DIAGNOSIS — Z79899 Other long term (current) drug therapy: Secondary | ICD-10-CM | POA: Diagnosis not present

## 2020-11-20 DIAGNOSIS — Z79899 Other long term (current) drug therapy: Secondary | ICD-10-CM | POA: Diagnosis not present

## 2020-11-22 DIAGNOSIS — Z79899 Other long term (current) drug therapy: Secondary | ICD-10-CM | POA: Diagnosis not present

## 2020-12-11 DIAGNOSIS — Z79899 Other long term (current) drug therapy: Secondary | ICD-10-CM | POA: Diagnosis not present

## 2020-12-14 DIAGNOSIS — Z79899 Other long term (current) drug therapy: Secondary | ICD-10-CM | POA: Diagnosis not present

## 2020-12-25 DIAGNOSIS — M62830 Muscle spasm of back: Secondary | ICD-10-CM | POA: Diagnosis not present

## 2020-12-25 DIAGNOSIS — Z79899 Other long term (current) drug therapy: Secondary | ICD-10-CM | POA: Diagnosis not present

## 2020-12-25 DIAGNOSIS — F17210 Nicotine dependence, cigarettes, uncomplicated: Secondary | ICD-10-CM | POA: Diagnosis not present

## 2020-12-25 DIAGNOSIS — Z760 Encounter for issue of repeat prescription: Secondary | ICD-10-CM | POA: Diagnosis not present

## 2020-12-25 DIAGNOSIS — Z6833 Body mass index (BMI) 33.0-33.9, adult: Secondary | ICD-10-CM | POA: Diagnosis not present

## 2020-12-25 DIAGNOSIS — M5416 Radiculopathy, lumbar region: Secondary | ICD-10-CM | POA: Diagnosis not present

## 2020-12-25 DIAGNOSIS — F17200 Nicotine dependence, unspecified, uncomplicated: Secondary | ICD-10-CM | POA: Diagnosis not present

## 2020-12-27 DIAGNOSIS — Z79899 Other long term (current) drug therapy: Secondary | ICD-10-CM | POA: Diagnosis not present

## 2021-01-08 DIAGNOSIS — E559 Vitamin D deficiency, unspecified: Secondary | ICD-10-CM | POA: Diagnosis not present

## 2021-01-08 DIAGNOSIS — Z79899 Other long term (current) drug therapy: Secondary | ICD-10-CM | POA: Diagnosis not present

## 2021-01-11 DIAGNOSIS — Z79899 Other long term (current) drug therapy: Secondary | ICD-10-CM | POA: Diagnosis not present

## 2021-01-23 DIAGNOSIS — F17210 Nicotine dependence, cigarettes, uncomplicated: Secondary | ICD-10-CM | POA: Diagnosis not present

## 2021-01-23 DIAGNOSIS — F410 Panic disorder [episodic paroxysmal anxiety] without agoraphobia: Secondary | ICD-10-CM | POA: Diagnosis not present

## 2021-01-23 DIAGNOSIS — Z720 Tobacco use: Secondary | ICD-10-CM | POA: Diagnosis not present

## 2021-01-23 DIAGNOSIS — F3130 Bipolar disorder, current episode depressed, mild or moderate severity, unspecified: Secondary | ICD-10-CM | POA: Diagnosis not present

## 2021-01-23 DIAGNOSIS — F411 Generalized anxiety disorder: Secondary | ICD-10-CM | POA: Diagnosis not present

## 2021-02-11 DIAGNOSIS — Z79899 Other long term (current) drug therapy: Secondary | ICD-10-CM | POA: Diagnosis not present

## 2021-02-11 DIAGNOSIS — E559 Vitamin D deficiency, unspecified: Secondary | ICD-10-CM | POA: Diagnosis not present

## 2021-02-11 DIAGNOSIS — B182 Chronic viral hepatitis C: Secondary | ICD-10-CM | POA: Diagnosis not present

## 2021-02-13 DIAGNOSIS — Z79899 Other long term (current) drug therapy: Secondary | ICD-10-CM | POA: Diagnosis not present

## 2021-02-25 DIAGNOSIS — Z79899 Other long term (current) drug therapy: Secondary | ICD-10-CM | POA: Diagnosis not present

## 2021-02-27 DIAGNOSIS — Z79899 Other long term (current) drug therapy: Secondary | ICD-10-CM | POA: Diagnosis not present

## 2021-03-11 DIAGNOSIS — Z79899 Other long term (current) drug therapy: Secondary | ICD-10-CM | POA: Diagnosis not present

## 2021-03-13 DIAGNOSIS — Z79899 Other long term (current) drug therapy: Secondary | ICD-10-CM | POA: Diagnosis not present

## 2021-04-03 DIAGNOSIS — Z79899 Other long term (current) drug therapy: Secondary | ICD-10-CM | POA: Diagnosis not present

## 2021-04-05 DIAGNOSIS — Z79899 Other long term (current) drug therapy: Secondary | ICD-10-CM | POA: Diagnosis not present

## 2021-05-01 DIAGNOSIS — F17200 Nicotine dependence, unspecified, uncomplicated: Secondary | ICD-10-CM | POA: Diagnosis not present

## 2021-05-01 DIAGNOSIS — Z9114 Patient's other noncompliance with medication regimen: Secondary | ICD-10-CM | POA: Diagnosis not present

## 2021-05-01 DIAGNOSIS — R030 Elevated blood-pressure reading, without diagnosis of hypertension: Secondary | ICD-10-CM | POA: Diagnosis not present

## 2021-05-01 DIAGNOSIS — F17210 Nicotine dependence, cigarettes, uncomplicated: Secondary | ICD-10-CM | POA: Diagnosis not present

## 2021-05-01 DIAGNOSIS — M5416 Radiculopathy, lumbar region: Secondary | ICD-10-CM | POA: Diagnosis not present

## 2021-05-01 DIAGNOSIS — Z79899 Other long term (current) drug therapy: Secondary | ICD-10-CM | POA: Diagnosis not present

## 2021-05-01 DIAGNOSIS — Z6833 Body mass index (BMI) 33.0-33.9, adult: Secondary | ICD-10-CM | POA: Diagnosis not present

## 2021-05-01 DIAGNOSIS — M62830 Muscle spasm of back: Secondary | ICD-10-CM | POA: Diagnosis not present

## 2021-05-03 DIAGNOSIS — Z79899 Other long term (current) drug therapy: Secondary | ICD-10-CM | POA: Diagnosis not present

## 2021-06-03 ENCOUNTER — Encounter (HOSPITAL_COMMUNITY): Payer: Self-pay | Admitting: Emergency Medicine

## 2021-06-03 ENCOUNTER — Emergency Department (HOSPITAL_COMMUNITY): Payer: Medicaid Other

## 2021-06-03 ENCOUNTER — Emergency Department (HOSPITAL_COMMUNITY)
Admission: EM | Admit: 2021-06-03 | Discharge: 2021-06-03 | Disposition: A | Discharge status: Home / Self Care | Payer: Medicaid Other | Attending: Emergency Medicine | Admitting: Emergency Medicine

## 2021-06-03 DIAGNOSIS — S0003XA Contusion of scalp, initial encounter: Secondary | ICD-10-CM

## 2021-06-03 DIAGNOSIS — S0990XA Unspecified injury of head, initial encounter: Secondary | ICD-10-CM | POA: Diagnosis present

## 2021-06-03 DIAGNOSIS — R519 Headache, unspecified: Secondary | ICD-10-CM | POA: Diagnosis not present

## 2021-06-03 DIAGNOSIS — R4182 Altered mental status, unspecified: Secondary | ICD-10-CM | POA: Diagnosis not present

## 2021-06-03 DIAGNOSIS — R52 Pain, unspecified: Secondary | ICD-10-CM | POA: Diagnosis not present
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
## 2021-06-03 NOTE — Discharge Instructions (Addendum)
Apply ice for 30 minutes at a time, 4 times a day.  Take acetaminophen and/or ibuprofen as needed for pain.
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
## 2021-06-03 NOTE — ED Notes (Signed)
Pt to ct 

## 2021-06-03 NOTE — ED Notes (Signed)
Pt's o2 dropped while seeping to 88% Vivien Rota RN aware placed on 2L o2 at this time

## 2021-06-04 ENCOUNTER — Telehealth: Payer: Self-pay

## 2021-06-04 NOTE — Telephone Encounter (Signed)
Transition Care Management Unsuccessful Follow-up Telephone Call  Date of discharge and from where:  06/03/2021 from [facility]  Attempts:  1st Attempt  Reason for unsuccessful TCM follow-up call:  Unable to leave message

## 2021-06-05 NOTE — Telephone Encounter (Signed)
Transition Care Management Unsuccessful Follow-up Telephone Call  Date of discharge and from where:  06/03/2021-   Attempts:  2nd Attempt  Reason for unsuccessful TCM follow-up call:  Unable to leave message
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
## 2021-06-07 DIAGNOSIS — I10 Essential (primary) hypertension: Secondary | ICD-10-CM | POA: Diagnosis not present

## 2021-06-07 DIAGNOSIS — R0902 Hypoxemia: Secondary | ICD-10-CM | POA: Diagnosis not present

## 2021-06-14 ENCOUNTER — Other Ambulatory Visit: Payer: Self-pay

## 2021-06-14 ENCOUNTER — Encounter (HOSPITAL_COMMUNITY): Payer: Self-pay

## 2021-06-14 ENCOUNTER — Emergency Department (HOSPITAL_COMMUNITY)
Admission: EM | Admit: 2021-06-14 | Discharge: 2021-06-15 | Disposition: A | Discharge status: Home / Self Care | Payer: Medicaid Other | Attending: Emergency Medicine | Admitting: Emergency Medicine

## 2021-06-14 DIAGNOSIS — R7309 Other abnormal glucose: Secondary | ICD-10-CM | POA: Diagnosis not present

## 2021-06-14 DIAGNOSIS — Z79899 Other long term (current) drug therapy: Secondary | ICD-10-CM | POA: Insufficient documentation

## 2021-06-14 DIAGNOSIS — T40601A Poisoning by unspecified narcotics, accidental (unintentional), initial encounter: Secondary | ICD-10-CM

## 2021-06-14 DIAGNOSIS — R7401 Elevation of levels of liver transaminase levels: Secondary | ICD-10-CM | POA: Diagnosis not present

## 2021-06-14 DIAGNOSIS — R4020 Unspecified coma: Secondary | ICD-10-CM | POA: Diagnosis not present

## 2021-06-14 DIAGNOSIS — R531 Weakness: Secondary | ICD-10-CM | POA: Insufficient documentation

## 2021-06-14 DIAGNOSIS — T402X1A Poisoning by other opioids, accidental (unintentional), initial encounter: Secondary | ICD-10-CM | POA: Insufficient documentation

## 2021-06-14 DIAGNOSIS — R739 Hyperglycemia, unspecified: Secondary | ICD-10-CM

## 2021-06-14 DIAGNOSIS — R4182 Altered mental status, unspecified: Secondary | ICD-10-CM | POA: Diagnosis not present

## 2021-06-14 DIAGNOSIS — Y900 Blood alcohol level of less than 20 mg/100 ml: Secondary | ICD-10-CM | POA: Diagnosis not present

## 2021-06-14 DIAGNOSIS — R9431 Abnormal electrocardiogram [ECG] [EKG]: Secondary | ICD-10-CM | POA: Diagnosis not present

## 2021-06-14 HISTORY — DX: Supraventricular tachycardia, unspecified: I47.10

## 2021-06-14 HISTORY — DX: Supraventricular tachycardia: I47.1

## 2021-06-14 HISTORY — DX: Depression, unspecified: F32.A

## 2021-06-14 HISTORY — DX: Post-traumatic stress disorder, unspecified: F43.10

## 2021-06-14 HISTORY — DX: Other symptoms and signs involving emotional state: R45.89

## 2021-06-14 HISTORY — DX: Anxiety disorder, unspecified: F41.9

## 2021-06-14 HISTORY — DX: Panic disorder (episodic paroxysmal anxiety): F41.0

## 2021-06-14 LAB — CBG MONITORING, ED: Glucose-Capillary: 246 mg/dL — ABNORMAL HIGH (ref 70–99)
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
## 2021-06-15 ENCOUNTER — Emergency Department (HOSPITAL_COMMUNITY): Payer: Medicaid Other

## 2021-06-15 DIAGNOSIS — R4182 Altered mental status, unspecified: Secondary | ICD-10-CM | POA: Diagnosis not present

## 2021-06-15 LAB — CBC WITH DIFFERENTIAL/PLATELET
Abs Immature Granulocytes: 0.01 10*3/uL (ref 0.00–0.07)
Basophils Absolute: 0 10*3/uL (ref 0.0–0.1)
Basophils Relative: 0 %
Eosinophils Absolute: 0.1 10*3/uL (ref 0.0–0.5)
Eosinophils Relative: 2 %
HCT: 43.1 % (ref 36.0–46.0)
Hemoglobin: 14.7 g/dL (ref 12.0–15.0)
Immature Granulocytes: 0 %
Lymphocytes Relative: 40 %
Lymphs Abs: 2.8 10*3/uL (ref 0.7–4.0)
MCH: 31 pg (ref 26.0–34.0)
MCHC: 34.1 g/dL (ref 30.0–36.0)
MCV: 90.9 fL (ref 80.0–100.0)
Monocytes Absolute: 0.5 10*3/uL (ref 0.1–1.0)
Monocytes Relative: 7 %
Neutro Abs: 3.5 10*3/uL (ref 1.7–7.7)
Neutrophils Relative %: 51 %
Platelets: 208 10*3/uL (ref 150–400)
RBC: 4.74 MIL/uL (ref 3.87–5.11)
RDW: 13 % (ref 11.5–15.5)
WBC: 6.9 10*3/uL (ref 4.0–10.5)
nRBC: 0 % (ref 0.0–0.2)

## 2021-06-15 LAB — RAPID URINE DRUG SCREEN, HOSP PERFORMED
Amphetamines: NOT DETECTED
Barbiturates: NOT DETECTED
Benzodiazepines: NOT DETECTED
Cocaine: NOT DETECTED
Opiates: NOT DETECTED
Tetrahydrocannabinol: POSITIVE — AB

## 2021-06-15 LAB — COMPREHENSIVE METABOLIC PANEL
ALT: 52 U/L — ABNORMAL HIGH (ref 0–44)
AST: 36 U/L (ref 15–41)
Albumin: 4 g/dL (ref 3.5–5.0)
Alkaline Phosphatase: 52 U/L (ref 38–126)
Anion gap: 4 — ABNORMAL LOW (ref 5–15)
BUN: 13 mg/dL (ref 6–20)
CO2: 30 mmol/L (ref 22–32)
Calcium: 8.6 mg/dL — ABNORMAL LOW (ref 8.9–10.3)
Chloride: 102 mmol/L (ref 98–111)
Creatinine, Ser: 1 mg/dL (ref 0.44–1.00)
GFR, Estimated: >60 mL/min (ref >60)
Glucose, Bld: 185 mg/dL — ABNORMAL HIGH (ref 70–99)
Potassium: 3.9 mmol/L (ref 3.5–5.1)
Sodium: 136 mmol/L (ref 135–145)
Total Bilirubin: 0.5 mg/dL (ref 0.3–1.2)
Total Protein: 8.2 g/dL — ABNORMAL HIGH (ref 6.5–8.1)

## 2021-06-15 LAB — ACETAMINOPHEN LEVEL: Acetaminophen (Tylenol), Serum: <10 ug/mL — ABNORMAL LOW (ref 10–30)

## 2021-06-15 LAB — URINALYSIS, ROUTINE W REFLEX MICROSCOPIC
Bilirubin Urine: NEGATIVE
Glucose, UA: 50 mg/dL — AB
Hgb urine dipstick: NEGATIVE
Ketones, ur: NEGATIVE mg/dL
Leukocytes,Ua: NEGATIVE
Nitrite: NEGATIVE
Protein, ur: NEGATIVE mg/dL
Specific Gravity, Urine: 1.009 (ref 1.005–1.030)
pH: 6 (ref 5.0–8.0)

## 2021-06-15 LAB — LACTIC ACID, PLASMA: Lactic Acid, Venous: 0.9 mmol/L (ref 0.5–1.9)

## 2021-06-15 LAB — ETHANOL: Alcohol, Ethyl (B): <10 mg/dL (ref <10)

## 2021-06-15 LAB — AMMONIA: Ammonia: 31 umol/L (ref 9–35)

## 2021-06-15 LAB — SALICYLATE LEVEL: Salicylate Lvl: <7 mg/dL — ABNORMAL LOW (ref 7.0–30.0)

## 2021-06-15 MED ORDER — NALOXONE HCL 2 MG/2ML IJ SOSY
2.0000 mg | PREFILLED_SYRINGE | Freq: Once | INTRAMUSCULAR | Status: AC
  Administered 2021-06-15: 2 mg via INTRAVENOUS
  Filled 2021-06-15: qty 2

## 2021-06-15 NOTE — Discharge Instructions (Signed)
Please discard the marijuana that she smoked tonight.  It likely is laced with fentanyl and if you use it again, the same thing is likely to happen, or even worse - you could die.  Please be aware that anytime you use illicit drugs, you do not know what you are actually getting and you run the risk of an accidental overdose like what happened tonight.

## 2021-06-15 NOTE — ED Notes (Signed)
Pt daughter arrived at registration- informed to send back to pt room 2 DR Roxanne Mins at bedside- daughter is now at bedside.
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
## 2021-06-17 ENCOUNTER — Telehealth: Payer: Self-pay

## 2021-06-17 NOTE — Telephone Encounter (Signed)
Transition Care Management Unsuccessful Follow-up Telephone Call  Date of discharge and from where:  06/15/2021-[place]   Attempts:  1st Attempt  Reason for unsuccessful TCM follow-up call:  Unable to reach patient

## 2021-06-18 NOTE — Telephone Encounter (Signed)
Transition Care Management Unsuccessful Follow-up Telephone Call  Date of discharge and from where:  06/15/2021 from [facility]  Attempts:  2nd Attempt  Reason for unsuccessful TCM follow-up call:  Unable to reach patient

## 2021-06-19 NOTE — Telephone Encounter (Signed)
Transition Care Management Unsuccessful Follow-up Telephone Call  Date of discharge and from where:  06/15/2021-[place]   Attempts:  3rd Attempt  Reason for unsuccessful TCM follow-up call:  Unable to reach patient

## 2021-07-20 ENCOUNTER — Emergency Department (HOSPITAL_COMMUNITY)
Admission: EM | Admit: 2021-07-20 | Discharge: 2021-07-20 | Disposition: A | Discharge status: Home / Self Care | Payer: Medicaid Other | Attending: Emergency Medicine | Admitting: Emergency Medicine

## 2021-07-20 ENCOUNTER — Encounter (HOSPITAL_COMMUNITY): Payer: Self-pay

## 2021-07-20 ENCOUNTER — Other Ambulatory Visit: Payer: Self-pay

## 2021-07-20 ENCOUNTER — Emergency Department (HOSPITAL_COMMUNITY): Payer: Medicaid Other

## 2021-07-20 DIAGNOSIS — Z720 Tobacco use: Secondary | ICD-10-CM | POA: Insufficient documentation

## 2021-07-20 DIAGNOSIS — S99912A Unspecified injury of left ankle, initial encounter: Secondary | ICD-10-CM | POA: Diagnosis present

## 2021-07-20 DIAGNOSIS — X503XXA Overexertion from repetitive movements, initial encounter: Secondary | ICD-10-CM | POA: Diagnosis not present

## 2021-07-20 DIAGNOSIS — Y9301 Activity, walking, marching and hiking: Secondary | ICD-10-CM | POA: Diagnosis not present

## 2021-07-20 DIAGNOSIS — S93492A Sprain of other ligament of left ankle, initial encounter: Secondary | ICD-10-CM | POA: Insufficient documentation

## 2021-07-20 DIAGNOSIS — M7989 Other specified soft tissue disorders: Secondary | ICD-10-CM | POA: Diagnosis not present

## 2021-07-20 DIAGNOSIS — M25572 Pain in left ankle and joints of left foot: Secondary | ICD-10-CM | POA: Diagnosis not present

## 2021-07-20 MED ORDER — IBUPROFEN 800 MG PO TABS
800.0000 mg | ORAL_TABLET | Freq: Once | ORAL | Status: AC
  Administered 2021-07-20: 800 mg via ORAL
  Filled 2021-07-20: qty 1

## 2021-07-20 NOTE — Discharge Instructions (Addendum)
Imaging was negative for any fracture dislocations.  This is good news.  I like for you to take 600 mg of ibuprofen every 6 hours as needed for pain control.  Keep elevating.  He can start doing rehab exercises at home to get the ankle more stable.  Return to the emergency department for worsening symptoms.  I have also given you orthopedic follow-up in the event that this does not get better.

## 2021-07-20 NOTE — ED Provider Notes (Signed)
[facility] EMERGENCY DEPARTMENT Provider Note   CSN: [id number] Arrival date & time: 07/20/21  9381     History Chief Complaint  Patient presents with   Ankle Pain    Samantha [name] is a 46 y.o. female who presents the emergency department with left-sided ankle pain after an inversion type injury approximately 3 days ago.  Patient to she was walking when she rolled her ankle inward.  Since then she has been having pain and swelling.  She has been using ice and elevation at home with little improvement.  Patient has been able to walk however it is painful.  No weakness or numbness to the toes or foot.  Patient does have a surgical history in that foot secondary to a cystic-like structure per the patient.  She denies any other injury.   Ankle Pain     Home Medications Prior to Admission medications   Medication Sig Start Date End Date Taking? Authorizing Provider  acetaminophen (TYLENOL) 500 MG tablet Take 1,000 mg by mouth every 6 (six) hours as needed.    [provider]  ALPRAZolam Duanne Moron) 0.5 MG tablet Take 0.5 mg by mouth 2 (two) times daily as needed. 01/23/21   [provider]  buPROPion (WELLBUTRIN) 100 MG tablet Take 100 mg by mouth 2 (two) times daily. 01/23/21   [provider]  busPIRone (BUSPAR) 15 MG tablet Take 15 mg by mouth 3 (three) times daily. 01/23/21   [provider]  citalopram (CELEXA) 20 MG tablet Take by mouth. 04/12/18   [provider]  citalopram (CELEXA) 20 MG tablet Take 1 tablet by mouth daily. 02/16/13   [provider]  cyclobenzaprine (FLEXERIL) 10 MG tablet Take 10 mg by mouth 3 (three) times daily as needed. 06/13/21   [provider]  diclofenac Sodium (VOLTAREN) 1 % GEL Apply 2 g topically 4 (four) times daily. 10/23/19   Joy, Shawn C, PA-C  hydrocortisone cream 1 % Apply to affected area 2 times daily 09/16/20   Rancour, Annie Main, MD  ibuprofen (ADVIL) 800 MG tablet Take 1 tablet (800 mg  total) by mouth every 8 (eight) hours as needed. 01/16/20   Carole Civil, MD  pregabalin (LYRICA) 50 MG capsule Take by mouth. 05/30/21   [provider]  SUBOXONE 8-2 MG FILM SMARTSIG:0.5 Strip(s) Sublingual Every 4 Hours PRN 05/01/21   [provider]  traZODone (DESYREL) 100 MG tablet Take 200 mg by mouth at bedtime. 05/31/21   [provider]  trazodone (DESYREL) 300 MG tablet Take 300 mg by mouth at bedtime. 01/23/21   [provider]  Vitamin D, Ergocalciferol, (DRISDOL) 1.25 MG (50000 UNIT) CAPS capsule Take 50,000 Units by mouth once a week. 02/11/21   [provider]  VRAYLAR 1.5 MG capsule Take 1.5 mg by mouth. 01/23/21   [provider]      Allergies    Gluten, Naproxen, and Tramadol    Review of Systems   Review of Systems  All other systems reviewed and are negative.  Physical Exam Updated Vital Signs BP 97/66    Pulse 87    Temp 98.4 F (36.9 C) (Oral)    Resp 17    Ht 5\' 1"  (1.549 m)    Wt 81.6 kg    SpO2 98%    BMI 34.01 kg/m  Physical Exam Vitals and nursing note reviewed.  Constitutional:      Appearance: Normal appearance.  HENT:     Head: Normocephalic  and atraumatic.  Eyes:     General:        Right eye: No discharge.        Left eye: No discharge.     Conjunctiva/sclera: Conjunctivae normal.  Pulmonary:     Effort: Pulmonary effort is normal.  Musculoskeletal:     Comments: Tenderness over the left ATFL.  2+ dorsalis pedis pulse on the left.  Good cap refill in the toes.  Mild swelling over the lateral malleolus.  Negative squeeze test.  Skin:    General: Skin is warm and dry.     Findings: No rash.  Neurological:     General: No focal deficit present.     Mental Status: She is alert.  Psychiatric:        Mood and Affect: Mood normal.        Behavior: Behavior normal.    ED Results / Procedures / Treatments   Labs (all labs ordered are listed, but only abnormal results are displayed) Labs  Reviewed - No data to display  EKG None  Radiology DG Ankle Complete Left  Result Date: 07/20/2021 CLINICAL DATA:  LEFT ankle pain.  Tripped on Thursday. EXAM: LEFT ANKLE COMPLETE - 3+ VIEW COMPARISON:  None. FINDINGS: There is no acute fracture or subluxation. No radiopaque foreign body or soft tissue gas. Significant ankle swelling noted, particularly along the MEDIAL aspect the joint. IMPRESSION: Soft tissue swelling.  No evidence for acute osseous abnormality. Electronically Signed   By: Nolon Nations M.D.   On: 07/20/2021 10:06    Procedures Procedures    Medications Ordered in ED Medications  ibuprofen (ADVIL) tablet 800 mg (800 mg Oral Given 07/20/21 0940)    ED Course/ Medical Decision Making/ A&P                           Medical Decision Making Amount and/or Complexity of Data Reviewed Radiology: ordered.  Risk Prescription drug management.   This patient presents to the ED for concern of ankle pain, this involves an extensive number of treatment options, and is a complaint that carries with it a high risk of complications and morbidity.  The differential diagnosis includes ankle sprain, ankle dislocation.  Musculoskeletal strain.  I have a low suspicion for syndesmosis injury as squeeze test is negative.   Co morbidities that complicate the patient evaluation  Tobacco use   Additional history obtained:  Additional history obtained from nursing note External records from outside source obtained and reviewed including old ED visits.  No relevant documentation related to her care today.   Imaging Studies ordered:  I ordered imaging studies including x-rays of the left ankle I independently visualized and interpreted imaging which showed no evidence of fracture dislocation I agree with the radiologist interpretation   Cardiac Monitoring:  The patient was maintained on a cardiac monitor.  I personally viewed and interpreted the cardiac monitored which  showed an underlying rhythm of: Normal sinus rhythm   Medicines ordered and prescription drug management:  I ordered medication including Motrin for pain. Reevaluation of the patient after these medicines showed that the patient improved I have reviewed the patients home medicines and have made adjustments as needed.   Problem List / ED Course:  Ankle pain: Likely ankle sprain.  Patient does have point tenderness over the ATFL given the mechanism of injury there is a high likelihood that this is an ankle sprain.  Imaging was negative for any  fracture dislocations.  I educated the patient about results and treatment.  We will have her take ibuprofen every 6 hours as needed for pain.  Also give her orthopedic follow-up if this does not get better.   Reevaluation:  After the interventions noted above, I reevaluated the patient and found that they have :improved   Social Determinants of Health:  Tobacco use   Dispostion:  After consideration of the diagnostic results and the patients response to treatment, I feel that the patent would benefit from outpatient follow-up.  Patient is ambulatory.  I will have her follow-up with orthopedics team at this is not better.  She is safe for discharge.  Vitals are normal.   Final Clinical Impression(s) / ED Diagnoses Final diagnoses:  Sprain of anterior talofibular ligament of left ankle, initial encounter    Rx / DC Orders ED Discharge Orders     None         Cherrie Gauze 07/20/21 Aline August, MD 07/22/21 [phone]

## 2021-07-20 NOTE — ED Triage Notes (Addendum)
Pt c/o left ankle pain . Pt tripped on Thursday and feels she has rolled her ankle. Pt ambulated without assistance.

## 2021-07-22 ENCOUNTER — Telehealth: Payer: Self-pay

## 2021-07-22 NOTE — Telephone Encounter (Signed)
Transition Care Management Unsuccessful Follow-up Telephone Call  Date of discharge and from where:  07/20/2021 from [facility]  Attempts:  1st Attempt  Reason for unsuccessful TCM follow-up call:  Left voice message

## 2021-07-23 NOTE — Telephone Encounter (Signed)
Transition Care Management Unsuccessful Follow-up Telephone Call  Date of discharge and from where:  07/20/2021 from [facility]  Attempts:  2nd Attempt  Reason for unsuccessful TCM follow-up call:  Left voice message

## 2021-07-24 NOTE — Telephone Encounter (Signed)
Transition Care Management Follow-up Telephone Call ?Date of discharge and from where: 07/20/2021 from [facility] ?How have you been since you were released from the hospital? Patient stated that her left ankle is still hurting. Patient stated that she did not have any questions or concerns at this time.  ?Any questions or concerns? No ? ?Items Reviewed: ?Did the pt receive and understand the discharge instructions provided? Yes  ?Medications obtained and verified? Yes  ?Other? No  ?Any new allergies since your discharge? No  ?Dietary orders reviewed? No ?Do you have support at home? Yes  ? ?Functional Questionnaire: (I = Independent and D = Dependent) ?ADLs: I ? ?Bathing/Dressing- I ? ?Meal Prep- I ? ?Eating- I ? ?Maintaining continence- I ? ?Transferring/Ambulation- I ? ?Managing Meds- I ? ? ?Follow up appointments reviewed: ? ?PCP Hospital f/u appt confirmed? No   ?Specialist Hospital f/u appt confirmed? No  Patient to call ortho.  ?Are transportation arrangements needed? No  ?If their condition worsens, is the pt aware to call PCP or go to the Emergency Dept.? Yes ?Was the patient provided with contact information for the PCP's office or ED? Yes ?Was to pt encouraged to call back with questions or concerns? Yes ? ?

## 2021-07-31 ENCOUNTER — Emergency Department (HOSPITAL_COMMUNITY)
Admission: EM | Admit: 2021-07-31 | Discharge: 2021-07-31 | Disposition: A | Discharge status: Home / Self Care | Payer: Medicaid Other | Attending: Emergency Medicine | Admitting: Emergency Medicine

## 2021-07-31 ENCOUNTER — Other Ambulatory Visit: Payer: Self-pay

## 2021-07-31 ENCOUNTER — Encounter (HOSPITAL_COMMUNITY): Payer: Self-pay

## 2021-07-31 ENCOUNTER — Emergency Department (HOSPITAL_COMMUNITY): Payer: Medicaid Other

## 2021-07-31 DIAGNOSIS — M79662 Pain in left lower leg: Secondary | ICD-10-CM | POA: Diagnosis not present

## 2021-07-31 DIAGNOSIS — Y9301 Activity, walking, marching and hiking: Secondary | ICD-10-CM | POA: Insufficient documentation

## 2021-07-31 DIAGNOSIS — S80811A Abrasion, right lower leg, initial encounter: Secondary | ICD-10-CM | POA: Insufficient documentation

## 2021-07-31 DIAGNOSIS — M79604 Pain in right leg: Secondary | ICD-10-CM | POA: Diagnosis not present

## 2021-07-31 DIAGNOSIS — W228XXA Striking against or struck by other objects, initial encounter: Secondary | ICD-10-CM | POA: Insufficient documentation

## 2021-07-31 DIAGNOSIS — S8991XA Unspecified injury of right lower leg, initial encounter: Secondary | ICD-10-CM | POA: Diagnosis present

## 2021-07-31 DIAGNOSIS — M25561 Pain in right knee: Secondary | ICD-10-CM | POA: Diagnosis not present

## 2021-07-31 DIAGNOSIS — Z041 Encounter for examination and observation following transport accident: Secondary | ICD-10-CM | POA: Diagnosis not present

## 2021-07-31 DIAGNOSIS — S62525B Nondisplaced fracture of distal phalanx of left thumb, initial encounter for open fracture: Secondary | ICD-10-CM

## 2021-07-31 DIAGNOSIS — M79605 Pain in left leg: Secondary | ICD-10-CM | POA: Diagnosis not present

## 2021-07-31 DIAGNOSIS — M25562 Pain in left knee: Secondary | ICD-10-CM | POA: Diagnosis not present

## 2021-07-31 DIAGNOSIS — R52 Pain, unspecified: Secondary | ICD-10-CM

## 2021-07-31 MED ORDER — IBUPROFEN 800 MG PO TABS
800.0000 mg | ORAL_TABLET | Freq: Once | ORAL | Status: AC
  Administered 2021-07-31: 800 mg via ORAL
  Filled 2021-07-31: qty 1

## 2021-07-31 MED ORDER — IBUPROFEN 800 MG PO TABS: 800.0000 mg | ORAL_TABLET | Freq: Three times a day (TID) | ORAL | 0 refills | Status: DC | PRN
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
## 2021-07-31 NOTE — Discharge Instructions (Signed)
You may use Tylenol and ibuprofen in alternation for your pain.  You should ice and elevate your knees as much as possible.  Return with any worsening symptoms, numbness, tingling, weakness or inability to walk. ?
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
## 2021-08-02 ENCOUNTER — Emergency Department (HOSPITAL_COMMUNITY): Payer: Medicaid Other

## 2021-08-02 ENCOUNTER — Other Ambulatory Visit: Payer: Self-pay

## 2021-08-02 ENCOUNTER — Encounter (HOSPITAL_COMMUNITY): Payer: Self-pay | Admitting: *Deleted

## 2021-08-02 ENCOUNTER — Emergency Department (HOSPITAL_COMMUNITY)
Admission: EM | Admit: 2021-08-02 | Discharge: 2021-08-02 | Disposition: A | Discharge status: Home / Self Care | Payer: Medicaid Other | Attending: Student | Admitting: Student

## 2021-08-02 DIAGNOSIS — R531 Weakness: Secondary | ICD-10-CM | POA: Diagnosis not present

## 2021-08-02 DIAGNOSIS — E876 Hypokalemia: Secondary | ICD-10-CM | POA: Diagnosis not present

## 2021-08-02 DIAGNOSIS — M25561 Pain in right knee: Secondary | ICD-10-CM | POA: Insufficient documentation

## 2021-08-02 DIAGNOSIS — F17210 Nicotine dependence, cigarettes, uncomplicated: Secondary | ICD-10-CM | POA: Insufficient documentation

## 2021-08-02 DIAGNOSIS — K8050 Calculus of bile duct without cholangitis or cholecystitis without obstruction: Secondary | ICD-10-CM | POA: Diagnosis not present

## 2021-08-02 DIAGNOSIS — R7401 Elevation of levels of liver transaminase levels: Secondary | ICD-10-CM | POA: Diagnosis not present

## 2021-08-02 DIAGNOSIS — R55 Syncope and collapse: Secondary | ICD-10-CM | POA: Insufficient documentation

## 2021-08-02 DIAGNOSIS — K8020 Calculus of gallbladder without cholecystitis without obstruction: Secondary | ICD-10-CM | POA: Insufficient documentation

## 2021-08-02 DIAGNOSIS — M25562 Pain in left knee: Secondary | ICD-10-CM | POA: Diagnosis not present

## 2021-08-02 DIAGNOSIS — W19XXXA Unspecified fall, initial encounter: Secondary | ICD-10-CM | POA: Diagnosis not present

## 2021-08-02 DIAGNOSIS — R1032 Left lower quadrant pain: Secondary | ICD-10-CM | POA: Diagnosis not present

## 2021-08-02 LAB — COMPREHENSIVE METABOLIC PANEL
ALT: 54 U/L — ABNORMAL HIGH (ref 0–44)
AST: 48 U/L — ABNORMAL HIGH (ref 15–41)
Albumin: 4.3 g/dL (ref 3.5–5.0)
Alkaline Phosphatase: 58 U/L (ref 38–126)
Anion gap: 10 (ref 5–15)
BUN: 6 mg/dL (ref 6–20)
CO2: 25 mmol/L (ref 22–32)
Calcium: 9.6 mg/dL (ref 8.9–10.3)
Chloride: 103 mmol/L (ref 98–111)
Creatinine, Ser: 0.7 mg/dL (ref 0.44–1.00)
GFR, Estimated: >60 mL/min (ref >60)
Glucose, Bld: 122 mg/dL — ABNORMAL HIGH (ref 70–99)
Potassium: 3.1 mmol/L — ABNORMAL LOW (ref 3.5–5.1)
Sodium: 138 mmol/L (ref 135–145)
Total Bilirubin: 0.2 mg/dL — ABNORMAL LOW (ref 0.3–1.2)
Total Protein: 8.7 g/dL — ABNORMAL HIGH (ref 6.5–8.1)

## 2021-08-02 LAB — CBC WITH DIFFERENTIAL/PLATELET
Abs Immature Granulocytes: 0.01 10*3/uL (ref 0.00–0.07)
Basophils Absolute: 0 10*3/uL (ref 0.0–0.1)
Basophils Relative: 0 %
Eosinophils Absolute: 0.1 10*3/uL (ref 0.0–0.5)
Eosinophils Relative: 1 %
HCT: 40.7 % (ref 36.0–46.0)
Hemoglobin: 14.7 g/dL (ref 12.0–15.0)
Immature Granulocytes: 0 %
Lymphocytes Relative: 33 %
Lymphs Abs: 1.7 10*3/uL (ref 0.7–4.0)
MCH: 31.2 pg (ref 26.0–34.0)
MCHC: 36.1 g/dL — ABNORMAL HIGH (ref 30.0–36.0)
MCV: 86.4 fL (ref 80.0–100.0)
Monocytes Absolute: 0.2 10*3/uL (ref 0.1–1.0)
Monocytes Relative: 4 %
Neutro Abs: 3.2 10*3/uL (ref 1.7–7.7)
Neutrophils Relative %: 62 %
Platelets: 260 10*3/uL (ref 150–400)
RBC: 4.71 MIL/uL (ref 3.87–5.11)
RDW: 12.8 % (ref 11.5–15.5)
WBC: 5.2 10*3/uL (ref 4.0–10.5)
nRBC: 0 % (ref 0.0–0.2)

## 2021-08-02 LAB — LIPASE, BLOOD: Lipase: 50 U/L (ref 11–51)

## 2021-08-02 LAB — URINALYSIS, ROUTINE W REFLEX MICROSCOPIC
Bilirubin Urine: NEGATIVE
Glucose, UA: NEGATIVE mg/dL
Hgb urine dipstick: NEGATIVE
Ketones, ur: NEGATIVE mg/dL
Leukocytes,Ua: NEGATIVE
Nitrite: NEGATIVE
Protein, ur: NEGATIVE mg/dL
Specific Gravity, Urine: 1.003 — ABNORMAL LOW (ref 1.005–1.030)
pH: 6 (ref 5.0–8.0)

## 2021-08-02 MED ORDER — KETOROLAC TROMETHAMINE 15 MG/ML IJ SOLN
15.0000 mg | Freq: Once | INTRAMUSCULAR | Status: AC
  Administered 2021-08-02: 15 mg via INTRAVENOUS
  Filled 2021-08-02: qty 1

## 2021-08-02 MED ORDER — LACTATED RINGERS IV BOLUS
1000.0000 mL | Freq: Once | INTRAVENOUS | Status: AC
Start: 2021-08-02 — End: 2021-08-02
  Administered 2021-08-02: 1000 mL via INTRAVENOUS

## 2021-08-02 MED ORDER — IOHEXOL 300 MG/ML  SOLN
100.0000 mL | Freq: Once | INTRAMUSCULAR | Status: AC | PRN
  Administered 2021-08-02: 100 mL via INTRAVENOUS

## 2021-08-02 MED ORDER — ONDANSETRON 8 MG PO TBDP: 8.0000 mg | ORAL_TABLET | Freq: Three times a day (TID) | ORAL | 0 refills | Status: DC | PRN

## 2021-08-02 MED ORDER — ONDANSETRON HCL 4 MG/2ML IJ SOLN
4.0000 mg | Freq: Once | INTRAMUSCULAR | Status: AC
  Administered 2021-08-02: 4 mg via INTRAVENOUS
  Filled 2021-08-02: qty 2
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
## 2021-08-02 NOTE — ED Provider Notes (Signed)
Patient was initially seen by Dr. Matilde Sprang.  Please see his note.  Plan was to follow-up on the ultrasound.  Ultrasound shows gallstones but no evidence of cholecystitis.  Patient is not having any abdominal pain now.  She is comfortable with discharge.  Discussed outpatient follow-up with general surgery and dietary modification ?  ?Dorie Rank, MD ?08/02/21 1712 ? ?
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
## 2021-08-02 NOTE — Discharge Instructions (Signed)
Take the medication as needed for nausea.  Try modifying her diet to produce gallbladder attacks.  Contact the surgeon to discuss further treatment of your gallstones.  Return as for fevers chills worsening symptoms ?

## 2021-08-05 ENCOUNTER — Telehealth: Payer: Self-pay

## 2021-08-05 NOTE — Telephone Encounter (Signed)
Transition Care Management Follow-up Telephone Call ?Date of discharge and from where: 08/02/2021 from [facility] ?How have you been since you were released from the hospital? Patient stated that she is feeling okay and did not have any questions at this time. Patient will reach to gen surg this morning.  ?Any questions or concerns? No ? ?Items Reviewed: ?Did the pt receive and understand the discharge instructions provided? Yes  ?Medications obtained and verified? Yes  ?Other? No  ?Any new allergies since your discharge? No  ?Dietary orders reviewed? No ?Do you have support at home? Yes  ? ?Functional Questionnaire: (I = Independent and D = Dependent) ?ADLs: I ? ?Bathing/Dressing- I ? ?Meal Prep- I ? ?Eating- I ? ?Maintaining continence- I ? ?Transferring/Ambulation- I ? ?Managing Meds- I ? ? ?Follow up appointments reviewed: ? ?PCP Hospital f/u appt confirmed? No   ?Specialist Hospital f/u appt confirmed? No  Patient to call general surgeon.  ?Are transportation arrangements needed? No  ?If their condition worsens, is the pt aware to call PCP or go to the Emergency Dept.? Yes ?Was the patient provided with contact information for the PCP's office or ED? Yes ?Was to pt encouraged to call back with questions or concerns? Yes ? ?

## 2021-12-18 ENCOUNTER — Telehealth: Payer: Self-pay | Admitting: Nurse Practitioner

## 2021-12-18 NOTE — Telephone Encounter (Signed)
..   Medicaid Managed Care   Unsuccessful Outreach Note  12/18/2021 Name: Samantha [name] MRN: [id number] DOB: 1976-03-23  Referred by: Riki Sheer, NP Reason for referral : High Risk Managed Medicaid (I called the patient today to get her scheduled with the MM Team. She did not answer and there was not a VM for me to leave a  message.)   An unsuccessful telephone outreach was attempted today. The patient was referred to the case management team for assistance with care management and care coordination.   Follow Up Plan: The care management team will reach out to the patient again over the next 7-14 days.     [place]

## 2022-01-03 ENCOUNTER — Telehealth: Payer: Self-pay | Admitting: Nurse Practitioner

## 2022-01-03 NOTE — Telephone Encounter (Signed)
..   Medicaid Managed Care   Unsuccessful Outreach Note  01/03/2022 Name: Samantha [name] MRN: [id number] DOB: 1975/07/25  Referred by: Riki Sheer, NP Reason for referral : High Risk Managed Medicaid (I called the patient today to get her scheduled with the MM Team. Her number was not in order.)   Third unsuccessful telephone outreach was attempted today. The patient was referred to the case management team for assistance with care management and care coordination. The patient's primary care provider has been notified of our unsuccessful attempts to make or maintain contact with the patient. The care management team is pleased to engage with this patient at any time in the future should he/she be interested in assistance from the care management team.   Follow Up Plan: We have been unable to make contact with the patient for follow up. The care management team is available to follow up with the patient after provider conversation with the patient regarding recommendation for care management engagement and subsequent re-referral to the care management team.    [place], [place]

## 2022-01-28 DIAGNOSIS — R0602 Shortness of breath: Secondary | ICD-10-CM | POA: Diagnosis not present

## 2022-01-28 DIAGNOSIS — Z0000 Encounter for general adult medical examination without abnormal findings: Secondary | ICD-10-CM | POA: Diagnosis not present

## 2022-01-28 DIAGNOSIS — M129 Arthropathy, unspecified: Secondary | ICD-10-CM | POA: Diagnosis not present

## 2022-01-28 DIAGNOSIS — Z6835 Body mass index (BMI) 35.0-35.9, adult: Secondary | ICD-10-CM | POA: Diagnosis not present

## 2022-01-28 DIAGNOSIS — I10 Essential (primary) hypertension: Secondary | ICD-10-CM | POA: Diagnosis not present

## 2022-01-28 DIAGNOSIS — Z79899 Other long term (current) drug therapy: Secondary | ICD-10-CM | POA: Diagnosis not present

## 2022-01-28 DIAGNOSIS — E039 Hypothyroidism, unspecified: Secondary | ICD-10-CM | POA: Diagnosis not present

## 2022-01-28 DIAGNOSIS — Z1159 Encounter for screening for other viral diseases: Secondary | ICD-10-CM | POA: Diagnosis not present

## 2022-01-28 DIAGNOSIS — E559 Vitamin D deficiency, unspecified: Secondary | ICD-10-CM | POA: Diagnosis not present

## 2022-01-28 DIAGNOSIS — R5383 Other fatigue: Secondary | ICD-10-CM | POA: Diagnosis not present

## 2022-01-30 DIAGNOSIS — Z79899 Other long term (current) drug therapy: Secondary | ICD-10-CM | POA: Diagnosis not present

## 2022-02-11 DIAGNOSIS — E039 Hypothyroidism, unspecified: Secondary | ICD-10-CM | POA: Diagnosis not present

## 2022-02-11 DIAGNOSIS — Z23 Encounter for immunization: Secondary | ICD-10-CM | POA: Diagnosis not present

## 2022-02-11 DIAGNOSIS — M542 Cervicalgia: Secondary | ICD-10-CM | POA: Diagnosis not present

## 2022-02-11 DIAGNOSIS — Z79899 Other long term (current) drug therapy: Secondary | ICD-10-CM | POA: Diagnosis not present

## 2022-02-11 DIAGNOSIS — M5416 Radiculopathy, lumbar region: Secondary | ICD-10-CM | POA: Diagnosis not present

## 2022-02-11 DIAGNOSIS — R0602 Shortness of breath: Secondary | ICD-10-CM | POA: Diagnosis not present

## 2022-02-11 DIAGNOSIS — B1920 Unspecified viral hepatitis C without hepatic coma: Secondary | ICD-10-CM | POA: Diagnosis not present

## 2022-02-11 DIAGNOSIS — Z6835 Body mass index (BMI) 35.0-35.9, adult: Secondary | ICD-10-CM | POA: Diagnosis not present

## 2022-03-14 DIAGNOSIS — Z131 Encounter for screening for diabetes mellitus: Secondary | ICD-10-CM | POA: Diagnosis not present

## 2022-03-14 DIAGNOSIS — M5030 Other cervical disc degeneration, unspecified cervical region: Secondary | ICD-10-CM | POA: Diagnosis not present

## 2022-03-14 DIAGNOSIS — F17210 Nicotine dependence, cigarettes, uncomplicated: Secondary | ICD-10-CM | POA: Diagnosis not present

## 2022-03-14 DIAGNOSIS — Z79899 Other long term (current) drug therapy: Secondary | ICD-10-CM | POA: Diagnosis not present

## 2022-03-14 DIAGNOSIS — G4700 Insomnia, unspecified: Secondary | ICD-10-CM | POA: Diagnosis not present

## 2022-03-14 DIAGNOSIS — Z6836 Body mass index (BMI) 36.0-36.9, adult: Secondary | ICD-10-CM | POA: Diagnosis not present

## 2022-03-14 DIAGNOSIS — F17200 Nicotine dependence, unspecified, uncomplicated: Secondary | ICD-10-CM | POA: Diagnosis not present

## 2022-03-14 DIAGNOSIS — E039 Hypothyroidism, unspecified: Secondary | ICD-10-CM | POA: Diagnosis not present

## 2022-03-14 DIAGNOSIS — M5416 Radiculopathy, lumbar region: Secondary | ICD-10-CM | POA: Diagnosis not present

## 2022-04-10 ENCOUNTER — Encounter (HOSPITAL_COMMUNITY): Payer: Self-pay | Admitting: *Deleted

## 2022-04-10 ENCOUNTER — Emergency Department (HOSPITAL_COMMUNITY)
Admission: EM | Admit: 2022-04-10 | Discharge: 2022-04-10 | Disposition: A | Discharge status: Home / Self Care | Payer: Medicaid Other | Attending: Emergency Medicine | Admitting: Emergency Medicine

## 2022-04-10 ENCOUNTER — Encounter (HOSPITAL_COMMUNITY): Payer: Self-pay

## 2022-04-10 ENCOUNTER — Emergency Department (HOSPITAL_COMMUNITY): Payer: Medicaid Other

## 2022-04-10 ENCOUNTER — Other Ambulatory Visit: Payer: Self-pay

## 2022-04-10 DIAGNOSIS — E039 Hypothyroidism, unspecified: Secondary | ICD-10-CM | POA: Diagnosis not present

## 2022-04-10 DIAGNOSIS — R0602 Shortness of breath: Secondary | ICD-10-CM | POA: Diagnosis not present

## 2022-04-10 DIAGNOSIS — R079 Chest pain, unspecified: Secondary | ICD-10-CM

## 2022-04-10 DIAGNOSIS — F1290 Cannabis use, unspecified, uncomplicated: Secondary | ICD-10-CM | POA: Insufficient documentation

## 2022-04-10 DIAGNOSIS — R52 Pain, unspecified: Secondary | ICD-10-CM | POA: Diagnosis not present

## 2022-04-10 DIAGNOSIS — I959 Hypotension, unspecified: Secondary | ICD-10-CM | POA: Diagnosis not present

## 2022-04-10 DIAGNOSIS — I10 Essential (primary) hypertension: Secondary | ICD-10-CM | POA: Diagnosis not present

## 2022-04-10 DIAGNOSIS — R0789 Other chest pain: Secondary | ICD-10-CM | POA: Diagnosis not present

## 2022-04-10 DIAGNOSIS — R064 Hyperventilation: Secondary | ICD-10-CM | POA: Diagnosis not present

## 2022-04-10 LAB — RAPID URINE DRUG SCREEN, HOSP PERFORMED
Amphetamines: NOT DETECTED
Barbiturates: NOT DETECTED
Benzodiazepines: NOT DETECTED
Cocaine: POSITIVE — AB
Opiates: NOT DETECTED
Tetrahydrocannabinol: POSITIVE — AB

## 2022-04-10 LAB — CBC WITH DIFFERENTIAL/PLATELET
Abs Immature Granulocytes: 0.02 10*3/uL (ref 0.00–0.07)
Basophils Absolute: 0 10*3/uL (ref 0.0–0.1)
Basophils Relative: 0 %
Eosinophils Absolute: 0.2 10*3/uL (ref 0.0–0.5)
Eosinophils Relative: 3 %
HCT: 45.2 % (ref 36.0–46.0)
Hemoglobin: 15.9 g/dL — ABNORMAL HIGH (ref 12.0–15.0)
Immature Granulocytes: 0 %
Lymphocytes Relative: 45 %
Lymphs Abs: 3.5 10*3/uL (ref 0.7–4.0)
MCH: 31 pg (ref 26.0–34.0)
MCHC: 35.2 g/dL (ref 30.0–36.0)
MCV: 88.1 fL (ref 80.0–100.0)
Monocytes Absolute: 0.6 10*3/uL (ref 0.1–1.0)
Monocytes Relative: 7 %
Neutro Abs: 3.5 10*3/uL (ref 1.7–7.7)
Neutrophils Relative %: 45 %
Platelets: 270 10*3/uL (ref 150–400)
RBC: 5.13 MIL/uL — ABNORMAL HIGH (ref 3.87–5.11)
RDW: 13.3 % (ref 11.5–15.5)
WBC: 7.8 10*3/uL (ref 4.0–10.5)
nRBC: 0 % (ref 0.0–0.2)

## 2022-04-10 LAB — URINALYSIS, ROUTINE W REFLEX MICROSCOPIC
Bilirubin Urine: NEGATIVE
Glucose, UA: NEGATIVE mg/dL
Hgb urine dipstick: NEGATIVE
Ketones, ur: NEGATIVE mg/dL
Leukocytes,Ua: NEGATIVE
Nitrite: NEGATIVE
Protein, ur: NEGATIVE mg/dL
Specific Gravity, Urine: 1.004 — ABNORMAL LOW (ref 1.005–1.030)
pH: 8 (ref 5.0–8.0)

## 2022-04-10 LAB — COMPREHENSIVE METABOLIC PANEL
ALT: 101 U/L — ABNORMAL HIGH (ref 0–44)
AST: 58 U/L — ABNORMAL HIGH (ref 15–41)
Albumin: 4.9 g/dL (ref 3.5–5.0)
Alkaline Phosphatase: 59 U/L (ref 38–126)
Anion gap: 11 (ref 5–15)
BUN: 12 mg/dL (ref 6–20)
CO2: 22 mmol/L (ref 22–32)
Calcium: 11.2 mg/dL — ABNORMAL HIGH (ref 8.9–10.3)
Chloride: 104 mmol/L (ref 98–111)
Creatinine, Ser: 0.88 mg/dL (ref 0.44–1.00)
GFR, Estimated: >60 mL/min (ref >60)
Glucose, Bld: 135 mg/dL — ABNORMAL HIGH (ref 70–99)
Potassium: 3.9 mmol/L (ref 3.5–5.1)
Sodium: 137 mmol/L (ref 135–145)
Total Bilirubin: 0.6 mg/dL (ref 0.3–1.2)
Total Protein: 9.8 g/dL — ABNORMAL HIGH (ref 6.5–8.1)

## 2022-04-10 LAB — MAGNESIUM: Magnesium: 2.2 mg/dL (ref 1.7–2.4)

## 2022-04-10 LAB — TSH: TSH: 12.423 u[IU]/mL — ABNORMAL HIGH (ref 0.350–4.500)

## 2022-04-10 LAB — PREGNANCY, URINE: Preg Test, Ur: NEGATIVE

## 2022-04-10 LAB — LIPASE, BLOOD: Lipase: 48 U/L (ref 11–51)

## 2022-04-10 LAB — TROPONIN I (HIGH SENSITIVITY): Troponin I (High Sensitivity): <2 ng/L (ref <18)

## 2022-04-10 LAB — D-DIMER, QUANTITATIVE: D-Dimer, Quant: <0.27 ug/mL-FEU (ref 0.00–0.50)

## 2022-04-10 LAB — BRAIN NATRIURETIC PEPTIDE: B Natriuretic Peptide: 8 pg/mL (ref 0.0–100.0)

## 2022-04-10 MED ORDER — LORAZEPAM 2 MG/ML IJ SOLN
1.0000 mg | Freq: Once | INTRAMUSCULAR | Status: AC
  Administered 2022-04-10: 1 mg via INTRAVENOUS
  Filled 2022-04-10: qty 1

## 2022-04-10 NOTE — ED Triage Notes (Signed)
Pt presents via [facility] c/o panic attack. Pt does have a history of panic attacks. Pt shaky and not answering many questions at this time. Pt also complaining of some chest tightness.

## 2022-04-10 NOTE — ED Provider Notes (Signed)
[facility] EMERGENCY DEPARTMENT Provider Note   CSN: [id number] Arrival date & time: 04/10/22  0254     History  Chief Complaint  Patient presents with   Panic Attack   Chest Pain    Samantha [name] is a 46 y.o. female.  46 year old female with a history of anxiety, hypothyroidism, marijuana use, hypertension who presents the ER today secondary to chest pain and anxiety.  Patient states that they were doing the normal thing and relaxing's patient smoked some marijuana after taking 4 Benadryl tablets and a short time Later she started feeling abnormal.  She states that she feels anxious and short of breath and feels like she is going to die.  She states that she has some chest pain and some arm pain.  Sounds like this was much worse with EMS and is improved now.  No nausea or vomiting.  No lightheadedness.   Chest Pain      Home Medications Prior to Admission medications   Not on File      Allergies    Trazodone    Review of Systems   Review of Systems  Cardiovascular:  Positive for chest pain.    Physical Exam Updated Vital Signs BP (!) 118/91   Pulse 92   Temp 98.1 F (36.7 C) (Oral)   Resp (!) 22   Ht '5\' 4"'$  (1.626 m)   Wt 77.1 kg   SpO2 97%   BMI 29.18 kg/m  Physical Exam Vitals and nursing note reviewed.  Constitutional:      Appearance: She is well-developed.  HENT:     Head: Normocephalic and atraumatic.  Eyes:     Pupils: Pupils are equal, round, and reactive to light.     Comments: Pupils dilated but equally reactive.  Cardiovascular:     Rate and Rhythm: Normal rate and regular rhythm.  Pulmonary:     Effort: Pulmonary effort is normal. No respiratory distress.     Breath sounds: No stridor. No decreased breath sounds.  Abdominal:     General: There is no distension.     Palpations: Abdomen is soft.  Musculoskeletal:        General: Normal range of motion.     Cervical back: Normal range of motion.     Right lower leg: No edema.     Left  lower leg: No edema.     Comments: Slightly shaky all over but no clonus or hyperreflexia  Skin:    General: Skin is warm and dry.  Neurological:     Mental Status: She is alert.     ED Results / Procedures / Treatments   Labs (all labs ordered are listed, but only abnormal results are displayed) Labs Reviewed  COMPREHENSIVE METABOLIC PANEL - Abnormal; Notable for the following components:      Result Value   Glucose, Bld 135 (*)    Calcium 11.2 (*)    Total Protein 9.8 (*)    AST 58 (*)    ALT 101 (*)    All other components within normal limits  CBC WITH DIFFERENTIAL/PLATELET - Abnormal; Notable for the following components:   RBC 5.13 (*)    Hemoglobin 15.9 (*)    All other components within normal limits  URINALYSIS, ROUTINE W REFLEX MICROSCOPIC - Abnormal; Notable for the following components:   Color, Urine STRAW (*)    Specific Gravity, Urine 1.004 (*)    All other components within normal limits  RAPID URINE DRUG SCREEN, HOSP PERFORMED -  Abnormal; Notable for the following components:   Cocaine POSITIVE (*)    Tetrahydrocannabinol POSITIVE (*)    All other components within normal limits  TSH - Abnormal; Notable for the following components:   TSH 12.423 (*)    All other components within normal limits  MAGNESIUM  LIPASE, BLOOD  BRAIN NATRIURETIC PEPTIDE  PREGNANCY, URINE  D-DIMER, QUANTITATIVE  CBG MONITORING, ED  TROPONIN I (HIGH SENSITIVITY)    EKG None  Radiology DG Chest Portable 1 View  Result Date: 04/10/2022 CLINICAL DATA:  Chest pain EXAM: PORTABLE CHEST 1 VIEW COMPARISON:  None Available. FINDINGS: Artifact from EKG leads. Normal heart size and mediastinal contours. No acute infiltrate or edema. No effusion or pneumothorax. No acute osseous findings. IMPRESSION: No active disease. Electronically Signed   By: Jorje Guild M.D.   On: 04/10/2022 04:29    Procedures Procedures    Medications Ordered in ED Medications  LORazepam (ATIVAN)  injection 1 mg (1 mg Intravenous Given 04/10/22 0330)    ED Course/ Medical Decision Making/ A&P                           Medical Decision Making Amount and/or Complexity of Data Reviewed Labs: ordered. Radiology: ordered.  Risk Prescription drug management.  Suspect likely anxiety versus less likely anticholinergic syndrome.  EKG reassuring.  Will check basic labs to include a D-dimer to make sure she does not a pulmonary embolus.  Check a troponin to make sure she does not have ACS.  X-ray to make sure is not pneumonia or pneumothorax or some other etiology for symptoms.  Ativan for symptomatic treatment.  Patient is already had aspirin. Improved symptoms. At baseline. No e/o PE with negative d dimer. Troponin/ecg reassuring, doubt ACS. UDS was positive cocaine but patient states she hasn't intentionally used cocaine in at least a couple years so wonders if her MJ was laced. Either way, stable at this time. Improved. Normal workup. Stable for discharge.   Final Clinical Impression(s) / ED Diagnoses Final diagnoses:  Nonspecific chest pain    Rx / DC Orders ED Discharge Orders     None         Loukisha Gunnerson, Corene Cornea, MD 04/10/22 [phone]

## 2022-04-14 DIAGNOSIS — Z79899 Other long term (current) drug therapy: Secondary | ICD-10-CM | POA: Diagnosis not present

## 2022-04-14 DIAGNOSIS — M5416 Radiculopathy, lumbar region: Secondary | ICD-10-CM | POA: Diagnosis not present

## 2022-04-14 DIAGNOSIS — Z6837 Body mass index (BMI) 37.0-37.9, adult: Secondary | ICD-10-CM | POA: Diagnosis not present

## 2022-04-14 DIAGNOSIS — M5030 Other cervical disc degeneration, unspecified cervical region: Secondary | ICD-10-CM | POA: Diagnosis not present

## 2022-04-14 DIAGNOSIS — F3130 Bipolar disorder, current episode depressed, mild or moderate severity, unspecified: Secondary | ICD-10-CM | POA: Diagnosis not present

## 2022-04-14 DIAGNOSIS — G4700 Insomnia, unspecified: Secondary | ICD-10-CM | POA: Diagnosis not present

## 2022-04-14 DIAGNOSIS — F17200 Nicotine dependence, unspecified, uncomplicated: Secondary | ICD-10-CM | POA: Diagnosis not present

## 2022-04-14 DIAGNOSIS — F17210 Nicotine dependence, cigarettes, uncomplicated: Secondary | ICD-10-CM | POA: Diagnosis not present

## 2022-05-14 DIAGNOSIS — Z79899 Other long term (current) drug therapy: Secondary | ICD-10-CM | POA: Diagnosis not present

## 2022-05-14 DIAGNOSIS — F17210 Nicotine dependence, cigarettes, uncomplicated: Secondary | ICD-10-CM | POA: Diagnosis not present

## 2022-05-14 DIAGNOSIS — M5416 Radiculopathy, lumbar region: Secondary | ICD-10-CM | POA: Diagnosis not present

## 2022-05-14 DIAGNOSIS — I10 Essential (primary) hypertension: Secondary | ICD-10-CM | POA: Diagnosis not present

## 2022-05-14 DIAGNOSIS — M5030 Other cervical disc degeneration, unspecified cervical region: Secondary | ICD-10-CM | POA: Diagnosis not present

## 2022-05-14 DIAGNOSIS — Z6836 Body mass index (BMI) 36.0-36.9, adult: Secondary | ICD-10-CM | POA: Diagnosis not present

## 2022-05-14 DIAGNOSIS — Z131 Encounter for screening for diabetes mellitus: Secondary | ICD-10-CM | POA: Diagnosis not present

## 2022-05-14 DIAGNOSIS — F17200 Nicotine dependence, unspecified, uncomplicated: Secondary | ICD-10-CM | POA: Diagnosis not present

## 2022-05-14 DIAGNOSIS — F419 Anxiety disorder, unspecified: Secondary | ICD-10-CM | POA: Diagnosis not present

## 2022-05-14 DIAGNOSIS — N911 Secondary amenorrhea: Secondary | ICD-10-CM | POA: Diagnosis not present

## 2022-06-13 DIAGNOSIS — M5030 Other cervical disc degeneration, unspecified cervical region: Secondary | ICD-10-CM | POA: Diagnosis not present

## 2022-06-13 DIAGNOSIS — F3130 Bipolar disorder, current episode depressed, mild or moderate severity, unspecified: Secondary | ICD-10-CM | POA: Diagnosis not present

## 2022-06-13 DIAGNOSIS — F17200 Nicotine dependence, unspecified, uncomplicated: Secondary | ICD-10-CM | POA: Diagnosis not present

## 2022-06-13 DIAGNOSIS — Z79899 Other long term (current) drug therapy: Secondary | ICD-10-CM | POA: Diagnosis not present

## 2022-06-13 DIAGNOSIS — F419 Anxiety disorder, unspecified: Secondary | ICD-10-CM | POA: Diagnosis not present

## 2022-06-13 DIAGNOSIS — M5416 Radiculopathy, lumbar region: Secondary | ICD-10-CM | POA: Diagnosis not present

## 2022-06-13 DIAGNOSIS — F17210 Nicotine dependence, cigarettes, uncomplicated: Secondary | ICD-10-CM | POA: Diagnosis not present

## 2022-06-13 DIAGNOSIS — I10 Essential (primary) hypertension: Secondary | ICD-10-CM | POA: Diagnosis not present

## 2022-06-13 DIAGNOSIS — Z6836 Body mass index (BMI) 36.0-36.9, adult: Secondary | ICD-10-CM | POA: Diagnosis not present

## 2022-06-17 DIAGNOSIS — Z79899 Other long term (current) drug therapy: Secondary | ICD-10-CM | POA: Diagnosis not present

## 2022-06-30 DIAGNOSIS — F419 Anxiety disorder, unspecified: Secondary | ICD-10-CM | POA: Diagnosis not present

## 2022-06-30 DIAGNOSIS — Z79899 Other long term (current) drug therapy: Secondary | ICD-10-CM | POA: Diagnosis not present

## 2022-06-30 DIAGNOSIS — M5030 Other cervical disc degeneration, unspecified cervical region: Secondary | ICD-10-CM | POA: Diagnosis not present

## 2022-06-30 DIAGNOSIS — Z6837 Body mass index (BMI) 37.0-37.9, adult: Secondary | ICD-10-CM | POA: Diagnosis not present

## 2022-07-02 DIAGNOSIS — Z79899 Other long term (current) drug therapy: Secondary | ICD-10-CM | POA: Diagnosis not present

## 2022-07-09 ENCOUNTER — Other Ambulatory Visit: Payer: Self-pay

## 2022-07-09 ENCOUNTER — Emergency Department (HOSPITAL_COMMUNITY): Payer: Medicaid Other

## 2022-07-09 ENCOUNTER — Encounter (HOSPITAL_COMMUNITY): Payer: Self-pay | Admitting: Emergency Medicine

## 2022-07-09 ENCOUNTER — Emergency Department (HOSPITAL_COMMUNITY)
Admission: EM | Admit: 2022-07-09 | Discharge: 2022-07-09 | Disposition: A | Discharge status: Home / Self Care | Payer: Medicaid Other | Attending: Emergency Medicine | Admitting: Emergency Medicine

## 2022-07-09 DIAGNOSIS — X58XXXA Exposure to other specified factors, initial encounter: Secondary | ICD-10-CM | POA: Insufficient documentation

## 2022-07-09 DIAGNOSIS — S93602A Unspecified sprain of left foot, initial encounter: Secondary | ICD-10-CM | POA: Diagnosis not present

## 2022-07-09 DIAGNOSIS — Z9889 Other specified postprocedural states: Secondary | ICD-10-CM | POA: Diagnosis not present

## 2022-07-09 DIAGNOSIS — M79672 Pain in left foot: Secondary | ICD-10-CM | POA: Diagnosis not present

## 2022-07-09 DIAGNOSIS — S99922A Unspecified injury of left foot, initial encounter: Secondary | ICD-10-CM | POA: Diagnosis present

## 2022-07-09 NOTE — ED Provider Notes (Signed)
[facility] Provider Note   CSN: [id number] Arrival date & time: 07/09/22  1203     History  Chief Complaint  Patient presents with   Foot Injury    Samantha [name] is a 47 y.o. female.   Foot Injury Patient presents with left foot pain.  States 3 days ago she stepped off the bottom step of her deck and felt a pop in her left foot.  Pain since.  Worse with walking.  No other injury.  Has had previous surgery on some of the toes of that foot but does not know exactly what was done.   Past Medical History:  Diagnosis Date   Anxiety disorder    Arrhythmia    Celiac disease    Chronic pain    DDD (degenerative disc disease)    Degenerative disc disease, lumbar    Depression    Interstitial cystitis    Kidney stones    Panic disorder    PTSD (post-traumatic stress disorder)    Suicidal risk    SVT (supraventricular tachycardia)    Past Surgical History:  Procedure Laterality Date   ABDOMINAL HYSTERECTOMY     ADENOIDECTOMY     APPENDECTOMY     CESAREAN SECTION     HERNIA REPAIR     KIDNEY STONE SURGERY     TONSILLECTOMY       Home Medications Prior to Admission medications   Medication Sig Start Date End Date Taking? Authorizing Provider  acetaminophen (TYLENOL) 500 MG tablet Take 1,000 mg by mouth every 6 (six) hours as needed.    [provider]  ALPRAZolam Duanne Moron) 0.5 MG tablet Take 0.5 mg by mouth 2 (two) times daily as needed. 01/23/21   [provider]  buPROPion (WELLBUTRIN) 100 MG tablet Take 100 mg by mouth 2 (two) times daily. 01/23/21   [provider]  busPIRone (BUSPAR) 15 MG tablet Take 15 mg by mouth 3 (three) times daily. 01/23/21   [provider]  citalopram (CELEXA) 20 MG tablet Take by mouth. 04/12/18   [provider]  citalopram (CELEXA) 20 MG tablet Take 1 tablet by mouth daily. 02/16/13   [provider]  cyclobenzaprine (FLEXERIL) 10 MG tablet Take  10 mg by mouth 3 (three) times daily as needed. 06/13/21   [provider]  diclofenac Sodium (VOLTAREN) 1 % GEL Apply 2 g topically 4 (four) times daily. 10/23/19   Joy, Shawn C, PA-C  hydrocortisone cream 1 % Apply to affected area 2 times daily 09/16/20   Rancour, Annie Main, MD  ibuprofen (ADVIL) 800 MG tablet Take 1 tablet (800 mg total) by mouth every 8 (eight) hours as needed. 07/31/21   Redwine, Madison A, PA-C  ondansetron (ZOFRAN-ODT) 8 MG disintegrating tablet Take 1 tablet (8 mg total) by mouth every 8 (eight) hours as needed for nausea or vomiting. 08/02/21   Dorie Rank, MD  pregabalin (LYRICA) 50 MG capsule Take by mouth. 05/30/21   [provider]  SUBOXONE 8-2 MG FILM SMARTSIG:0.5 Strip(s) Sublingual Every 4 Hours PRN 05/01/21   [provider]  traZODone (DESYREL) 100 MG tablet Take 200 mg by mouth at bedtime. 05/31/21   [provider]  trazodone (DESYREL) 300 MG tablet Take 300 mg by mouth at bedtime. 01/23/21   [provider]  Vitamin D, Ergocalciferol, (DRISDOL) 1.25 MG (50000 UNIT) CAPS capsule Take 50,000 Units by mouth once a week. 02/11/21   [provider]  VRAYLAR 1.5 MG capsule Take 1.5 mg by mouth. 01/23/21   [provider]      Allergies    Gluten, Naproxen, and Tramadol    Review of Systems   Review of Systems  Physical Exam Updated Vital Signs BP 112/75   Pulse 66   Temp 98.6 F (37 C) (Oral)   Ht 5' 1"$  (1.549 m)   Wt 87.5 kg   SpO2 97%   BMI 36.47 kg/m  Physical Exam Vitals and nursing note reviewed.  Cardiovascular:     Rate and Rhythm: Regular rhythm.  Musculoskeletal:     Cervical back: Neck supple.     Comments: Tenderness over forefoot on left foot.  Scars on dorsum of second third and fourth toe from previous surgery.  Sensation grossly intact.  Some swelling of the foot.  No erythema.  No tenderness more proximally over ankle more proximally over ankle or lower extremity.  Neurological:      Mental Status: She is alert.     ED Results / Procedures / Treatments   Labs (all labs ordered are listed, but only abnormal results are displayed) Labs Reviewed - No data to display  EKG None  Radiology DG Foot Complete Left  Result Date: 07/09/2022 CLINICAL DATA:  Fall, pain EXAM: LEFT FOOT - COMPLETE 3+ VIEW COMPARISON:  10/11/2019 FINDINGS: Chronic deformities of the second, third and fourth proximal phalanges from remote surgery. No acute osseous finding or fracture. No focal soft tissue abnormality. No other joint finding. IMPRESSION: Remote postsurgical changes. No acute finding by plain radiography. Electronically Signed   By: Jerilynn Mages.  Shick M.D.   On: 07/09/2022 12:57    Procedures Procedures    Medications Ordered in ED Medications - No data to display  ED Course/ Medical Decision Making/ A&P                             Medical Decision Making Amount and/or Complexity of Data Reviewed Radiology: ordered.   Patient with left foot injury.  Felt pop when stepping.  X-ray independently interpreted reassuring in terms of acute fracture.  Does have previous surgical changes.  Reviewed drug database and patient is on chronic opiates and still should have pills at home.  Given cam walker for comfort and have follow-up with orthopedic surgery.  Dr. Lucia Gaskins is on-call.  Discharge home with outpatient follow-up.        Final Clinical Impression(s) / ED Diagnoses Final diagnoses:  Sprain of left foot, initial encounter    Rx / DC Orders ED Discharge Orders     None         Davonna Belling, MD 07/09/22 1319

## 2022-07-09 NOTE — ED Triage Notes (Signed)
Pt states she missed the bottom step on her deck x3 days ago hurting her left foot.states she heard a pop. Pt ambulatory

## 2022-08-13 DIAGNOSIS — Z6837 Body mass index (BMI) 37.0-37.9, adult: Secondary | ICD-10-CM | POA: Diagnosis not present

## 2022-08-13 DIAGNOSIS — F419 Anxiety disorder, unspecified: Secondary | ICD-10-CM | POA: Diagnosis not present

## 2022-08-13 DIAGNOSIS — M5030 Other cervical disc degeneration, unspecified cervical region: Secondary | ICD-10-CM | POA: Diagnosis not present

## 2022-08-13 DIAGNOSIS — Z79899 Other long term (current) drug therapy: Secondary | ICD-10-CM | POA: Diagnosis not present

## 2022-08-15 DIAGNOSIS — Z79899 Other long term (current) drug therapy: Secondary | ICD-10-CM | POA: Diagnosis not present

## 2022-09-12 DIAGNOSIS — Z79899 Other long term (current) drug therapy: Secondary | ICD-10-CM | POA: Diagnosis not present

## 2022-09-12 DIAGNOSIS — M5030 Other cervical disc degeneration, unspecified cervical region: Secondary | ICD-10-CM | POA: Diagnosis not present

## 2022-09-12 DIAGNOSIS — Z6839 Body mass index (BMI) 39.0-39.9, adult: Secondary | ICD-10-CM | POA: Diagnosis not present

## 2022-09-12 DIAGNOSIS — F419 Anxiety disorder, unspecified: Secondary | ICD-10-CM | POA: Diagnosis not present

## 2022-09-16 DIAGNOSIS — Z79899 Other long term (current) drug therapy: Secondary | ICD-10-CM | POA: Diagnosis not present

## 2022-10-09 DIAGNOSIS — Z6839 Body mass index (BMI) 39.0-39.9, adult: Secondary | ICD-10-CM | POA: Diagnosis not present

## 2022-10-09 DIAGNOSIS — M5030 Other cervical disc degeneration, unspecified cervical region: Secondary | ICD-10-CM | POA: Diagnosis not present

## 2022-10-09 DIAGNOSIS — Z131 Encounter for screening for diabetes mellitus: Secondary | ICD-10-CM | POA: Diagnosis not present

## 2022-10-09 DIAGNOSIS — M129 Arthropathy, unspecified: Secondary | ICD-10-CM | POA: Diagnosis not present

## 2022-10-09 DIAGNOSIS — G40909 Epilepsy, unspecified, not intractable, without status epilepticus: Secondary | ICD-10-CM | POA: Diagnosis not present

## 2022-10-09 DIAGNOSIS — Z79899 Other long term (current) drug therapy: Secondary | ICD-10-CM | POA: Diagnosis not present

## 2022-10-09 DIAGNOSIS — E7800 Pure hypercholesterolemia, unspecified: Secondary | ICD-10-CM | POA: Diagnosis not present

## 2022-10-09 DIAGNOSIS — F419 Anxiety disorder, unspecified: Secondary | ICD-10-CM | POA: Diagnosis not present

## 2022-10-09 DIAGNOSIS — E039 Hypothyroidism, unspecified: Secondary | ICD-10-CM | POA: Diagnosis not present

## 2022-10-13 DIAGNOSIS — Z79899 Other long term (current) drug therapy: Secondary | ICD-10-CM | POA: Diagnosis not present

## 2022-10-22 DIAGNOSIS — Z79899 Other long term (current) drug therapy: Secondary | ICD-10-CM | POA: Diagnosis not present

## 2022-10-22 DIAGNOSIS — M5030 Other cervical disc degeneration, unspecified cervical region: Secondary | ICD-10-CM | POA: Diagnosis not present

## 2022-10-22 DIAGNOSIS — Z6839 Body mass index (BMI) 39.0-39.9, adult: Secondary | ICD-10-CM | POA: Diagnosis not present

## 2022-10-30 DIAGNOSIS — Z79899 Other long term (current) drug therapy: Secondary | ICD-10-CM | POA: Diagnosis not present

## 2022-11-21 DIAGNOSIS — M5030 Other cervical disc degeneration, unspecified cervical region: Secondary | ICD-10-CM | POA: Diagnosis not present

## 2022-11-21 DIAGNOSIS — Z79899 Other long term (current) drug therapy: Secondary | ICD-10-CM | POA: Diagnosis not present

## 2022-11-21 DIAGNOSIS — F419 Anxiety disorder, unspecified: Secondary | ICD-10-CM | POA: Diagnosis not present

## 2022-11-21 DIAGNOSIS — Z6841 Body Mass Index (BMI) 40.0 and over, adult: Secondary | ICD-10-CM | POA: Diagnosis not present

## 2022-11-21 DIAGNOSIS — E039 Hypothyroidism, unspecified: Secondary | ICD-10-CM | POA: Diagnosis not present

## 2022-11-25 DIAGNOSIS — Z79899 Other long term (current) drug therapy: Secondary | ICD-10-CM | POA: Diagnosis not present

## 2022-11-28 DIAGNOSIS — K900 Celiac disease: Secondary | ICD-10-CM | POA: Diagnosis not present

## 2022-11-28 DIAGNOSIS — Z79899 Other long term (current) drug therapy: Secondary | ICD-10-CM | POA: Diagnosis not present

## 2022-11-28 DIAGNOSIS — M5030 Other cervical disc degeneration, unspecified cervical region: Secondary | ICD-10-CM | POA: Diagnosis not present

## 2022-11-28 DIAGNOSIS — Z6839 Body mass index (BMI) 39.0-39.9, adult: Secondary | ICD-10-CM | POA: Diagnosis not present

## 2022-11-28 DIAGNOSIS — F3130 Bipolar disorder, current episode depressed, mild or moderate severity, unspecified: Secondary | ICD-10-CM | POA: Diagnosis not present

## 2022-12-02 DIAGNOSIS — Z79899 Other long term (current) drug therapy: Secondary | ICD-10-CM | POA: Diagnosis not present

## 2022-12-08 DIAGNOSIS — Z6841 Body Mass Index (BMI) 40.0 and over, adult: Secondary | ICD-10-CM | POA: Diagnosis not present

## 2022-12-08 DIAGNOSIS — F332 Major depressive disorder, recurrent severe without psychotic features: Secondary | ICD-10-CM | POA: Diagnosis not present

## 2022-12-08 DIAGNOSIS — F410 Panic disorder [episodic paroxysmal anxiety] without agoraphobia: Secondary | ICD-10-CM | POA: Diagnosis not present

## 2022-12-08 DIAGNOSIS — Z79899 Other long term (current) drug therapy: Secondary | ICD-10-CM | POA: Diagnosis not present

## 2022-12-08 DIAGNOSIS — Z3200 Encounter for pregnancy test, result unknown: Secondary | ICD-10-CM | POA: Diagnosis not present

## 2022-12-08 DIAGNOSIS — F411 Generalized anxiety disorder: Secondary | ICD-10-CM | POA: Diagnosis not present

## 2022-12-08 DIAGNOSIS — F3130 Bipolar disorder, current episode depressed, mild or moderate severity, unspecified: Secondary | ICD-10-CM | POA: Diagnosis not present

## 2022-12-08 DIAGNOSIS — G40909 Epilepsy, unspecified, not intractable, without status epilepticus: Secondary | ICD-10-CM | POA: Diagnosis not present

## 2022-12-17 ENCOUNTER — Ambulatory Visit: Payer: Medicaid Other | Admitting: Podiatry

## 2022-12-17 VITALS — BP 123/77

## 2022-12-17 DIAGNOSIS — D492 Neoplasm of unspecified behavior of bone, soft tissue, and skin: Secondary | ICD-10-CM

## 2022-12-17 NOTE — Progress Notes (Signed)
Subjective:  Patient ID: Samantha [name], female    DOB: [birth year],  MRN: [id number]  Chief Complaint  Patient presents with   Foot Pain    np - bil - feels like rocks in her feet    47 y.o. female presents with the above complaint.  Patient presents with bilateral submetatarsal 5 porokeratotic lesion with central nucleated core.  Patient states painful to touch she would like to have them debrided and she feels like she is standing on rocks.  She has not seen and was prior to seeing me denies any other acute complaints.   Review of Systems: Negative except as noted in the HPI. Denies N/V/F/Ch.  Past Medical History:  Diagnosis Date   Anxiety disorder    Arrhythmia    Celiac disease    Chronic pain    DDD (degenerative disc disease)    Degenerative disc disease, lumbar    Depression    Interstitial cystitis    Kidney stones    Panic disorder    PTSD (post-traumatic stress disorder)    Suicidal risk    SVT (supraventricular tachycardia)     Current Outpatient Medications:    acetaminophen (TYLENOL) 500 MG tablet, Take 1,000 mg by mouth every 6 (six) hours as needed., Disp: , Rfl:    ALPRAZolam (XANAX) 0.5 MG tablet, Take 0.5 mg by mouth 2 (two) times daily as needed., Disp: , Rfl:    buPROPion (WELLBUTRIN) 100 MG tablet, Take 100 mg by mouth 2 (two) times daily., Disp: , Rfl:    busPIRone (BUSPAR) 15 MG tablet, Take 15 mg by mouth 3 (three) times daily., Disp: , Rfl:    citalopram (CELEXA) 20 MG tablet, Take by mouth., Disp: , Rfl:    citalopram (CELEXA) 20 MG tablet, Take 1 tablet by mouth daily., Disp: , Rfl:    cyclobenzaprine (FLEXERIL) 10 MG tablet, Take 10 mg by mouth 3 (three) times daily as needed., Disp: , Rfl:    diclofenac Sodium (VOLTAREN) 1 % GEL, Apply 2 g topically 4 (four) times daily., Disp: 100 g, Rfl: 1   hydrocortisone cream 1 %, Apply to affected area 2 times daily, Disp: 15 g, Rfl: 0   ibuprofen (ADVIL) 800 MG tablet, Take 1 tablet (800 mg total) by  mouth every 8 (eight) hours as needed., Disp: 30 tablet, Rfl: 0   ondansetron (ZOFRAN-ODT) 8 MG disintegrating tablet, Take 1 tablet (8 mg total) by mouth every 8 (eight) hours as needed for nausea or vomiting., Disp: 20 tablet, Rfl: 0   pregabalin (LYRICA) 50 MG capsule, Take by mouth., Disp: , Rfl:    SUBOXONE 8-2 MG FILM, SMARTSIG:0.5 Strip(s) Sublingual Every 4 Hours PRN, Disp: , Rfl:    traZODone (DESYREL) 100 MG tablet, Take 200 mg by mouth at bedtime., Disp: , Rfl:    trazodone (DESYREL) 300 MG tablet, Take 300 mg by mouth at bedtime., Disp: , Rfl:    Vitamin D, Ergocalciferol, (DRISDOL) 1.25 MG (50000 UNIT) CAPS capsule, Take 50,000 Units by mouth once a week., Disp: , Rfl:    VRAYLAR 1.5 MG capsule, Take 1.5 mg by mouth., Disp: , Rfl:   Social History   Tobacco Use  Smoking Status Every Day   Current packs/day: 0.50   Types: Cigarettes  Smokeless Tobacco Never    Allergies  Allergen Reactions   Gluten Other (See Comments)    Patient has condition that eliminates any Gluten intake   Naproxen     abd pain  Tramadol Nausea And Vomiting   Objective:   Vitals:   12/17/22 1343  BP: 123/77   There is no height or weight on file to calculate BMI. Constitutional Well developed. Well nourished.  Vascular Dorsalis pedis pulses palpable bilaterally. Posterior tibial pulses palpable bilaterally. Capillary refill normal to all digits.  No cyanosis or clubbing noted. Pedal hair growth normal.  Neurologic Normal speech. Oriented to person, place, and time. Epicritic sensation to light touch grossly present bilaterally.  Dermatologic Bilateral submet 5 porokeratosis with central nucleated core pain on palpation.  No pinpoint bleeding noted upon debridement  Orthopedic: Normal joint ROM without pain or crepitus bilaterally. No visible deformities. No bony tenderness.   Radiographs: None Assessment:   1. Skin neoplasm    Plan:  Patient was evaluated and treated and all  questions answered.  Bilateral submet 5 benign skin lesion -All questions and concerns were discussed with the patient in extensive detail given the amount of pain that she is having she will benefit from aggressive debridement of the lesion using chisel blade and the lesion was debrided down to healthy dry tissue no complication noted no pinpoint bleeding noted.  No follow-ups on file.

## 2022-12-18 DIAGNOSIS — Z6839 Body mass index (BMI) 39.0-39.9, adult: Secondary | ICD-10-CM | POA: Diagnosis not present

## 2022-12-18 DIAGNOSIS — F3130 Bipolar disorder, current episode depressed, mild or moderate severity, unspecified: Secondary | ICD-10-CM | POA: Diagnosis not present

## 2022-12-18 DIAGNOSIS — F332 Major depressive disorder, recurrent severe without psychotic features: Secondary | ICD-10-CM | POA: Diagnosis not present

## 2022-12-18 DIAGNOSIS — F410 Panic disorder [episodic paroxysmal anxiety] without agoraphobia: Secondary | ICD-10-CM | POA: Diagnosis not present

## 2022-12-18 DIAGNOSIS — F411 Generalized anxiety disorder: Secondary | ICD-10-CM | POA: Diagnosis not present

## 2022-12-29 DIAGNOSIS — K900 Celiac disease: Secondary | ICD-10-CM | POA: Diagnosis not present

## 2022-12-29 DIAGNOSIS — F3130 Bipolar disorder, current episode depressed, mild or moderate severity, unspecified: Secondary | ICD-10-CM | POA: Diagnosis not present

## 2022-12-29 DIAGNOSIS — Z79899 Other long term (current) drug therapy: Secondary | ICD-10-CM | POA: Diagnosis not present

## 2022-12-29 DIAGNOSIS — M5030 Other cervical disc degeneration, unspecified cervical region: Secondary | ICD-10-CM | POA: Diagnosis not present

## 2022-12-29 DIAGNOSIS — Z6839 Body mass index (BMI) 39.0-39.9, adult: Secondary | ICD-10-CM | POA: Diagnosis not present

## 2023-01-01 DIAGNOSIS — Z79899 Other long term (current) drug therapy: Secondary | ICD-10-CM | POA: Diagnosis not present

## 2023-01-05 DIAGNOSIS — F3130 Bipolar disorder, current episode depressed, mild or moderate severity, unspecified: Secondary | ICD-10-CM | POA: Diagnosis not present

## 2023-01-05 DIAGNOSIS — F332 Major depressive disorder, recurrent severe without psychotic features: Secondary | ICD-10-CM | POA: Diagnosis not present

## 2023-01-05 DIAGNOSIS — F411 Generalized anxiety disorder: Secondary | ICD-10-CM | POA: Diagnosis not present

## 2023-01-05 DIAGNOSIS — F410 Panic disorder [episodic paroxysmal anxiety] without agoraphobia: Secondary | ICD-10-CM | POA: Diagnosis not present

## 2023-01-09 ENCOUNTER — Ambulatory Visit: Payer: Medicaid Other | Admitting: Podiatry

## 2023-01-09 ENCOUNTER — Ambulatory Visit: Payer: Medicaid Other

## 2023-01-09 ENCOUNTER — Other Ambulatory Visit: Payer: Self-pay | Admitting: Podiatry

## 2023-01-09 DIAGNOSIS — D492 Neoplasm of unspecified behavior of bone, soft tissue, and skin: Secondary | ICD-10-CM | POA: Diagnosis not present

## 2023-01-09 DIAGNOSIS — M216X1 Other acquired deformities of right foot: Secondary | ICD-10-CM

## 2023-01-09 DIAGNOSIS — M216X2 Other acquired deformities of left foot: Secondary | ICD-10-CM | POA: Diagnosis not present

## 2023-01-09 NOTE — Progress Notes (Signed)
Subjective:  Patient ID: Samantha [name], female    DOB: 1976/04/06,  MRN: [id number]  Chief Complaint  Patient presents with   Foot Pain    Pt stated that she is still having pain     47 y.o. female presents with the above complaint.  Patient presents with bilateral plantarflexed fifth metatarsal with submetatarsal 5 lesion.  Patient states is painful to touch is progressive gotten worse worse with ambulation worse with pressure she is tried multiple debridement and shoe gear modification padding protecting offloading none of which has helped she would like to discuss surgical options at this time.   Review of Systems: Negative except as noted in the HPI. Denies N/V/F/Ch.  Past Medical History:  Diagnosis Date   Anxiety disorder    Arrhythmia    Celiac disease    Chronic pain    DDD (degenerative disc disease)    Degenerative disc disease, lumbar    Depression    Interstitial cystitis    Kidney stones    Panic disorder    PTSD (post-traumatic stress disorder)    Suicidal risk    SVT (supraventricular tachycardia)     Current Outpatient Medications:    acetaminophen (TYLENOL) 500 MG tablet, Take 1,000 mg by mouth every 6 (six) hours as needed., Disp: , Rfl:    ALPRAZolam (XANAX) 0.5 MG tablet, Take 0.5 mg by mouth 2 (two) times daily as needed., Disp: , Rfl:    buPROPion (WELLBUTRIN) 100 MG tablet, Take 100 mg by mouth 2 (two) times daily., Disp: , Rfl:    busPIRone (BUSPAR) 15 MG tablet, Take 15 mg by mouth 3 (three) times daily., Disp: , Rfl:    citalopram (CELEXA) 20 MG tablet, Take by mouth., Disp: , Rfl:    citalopram (CELEXA) 20 MG tablet, Take 1 tablet by mouth daily., Disp: , Rfl:    cyclobenzaprine (FLEXERIL) 10 MG tablet, Take 10 mg by mouth 3 (three) times daily as needed., Disp: , Rfl:    diclofenac Sodium (VOLTAREN) 1 % GEL, Apply 2 g topically 4 (four) times daily., Disp: 100 g, Rfl: 1   hydrocortisone cream 1 %, Apply to affected area 2 times daily, Disp: 15 g,  Rfl: 0   ibuprofen (ADVIL) 800 MG tablet, Take 1 tablet (800 mg total) by mouth every 8 (eight) hours as needed., Disp: 30 tablet, Rfl: 0   ondansetron (ZOFRAN-ODT) 8 MG disintegrating tablet, Take 1 tablet (8 mg total) by mouth every 8 (eight) hours as needed for nausea or vomiting., Disp: 20 tablet, Rfl: 0   pregabalin (LYRICA) 50 MG capsule, Take by mouth., Disp: , Rfl:    SUBOXONE 8-2 MG FILM, SMARTSIG:0.5 Strip(s) Sublingual Every 4 Hours PRN, Disp: , Rfl:    traZODone (DESYREL) 100 MG tablet, Take 200 mg by mouth at bedtime., Disp: , Rfl:    trazodone (DESYREL) 300 MG tablet, Take 300 mg by mouth at bedtime., Disp: , Rfl:    Vitamin D, Ergocalciferol, (DRISDOL) 1.25 MG (50000 UNIT) CAPS capsule, Take 50,000 Units by mouth once a week., Disp: , Rfl:    VRAYLAR 1.5 MG capsule, Take 1.5 mg by mouth., Disp: , Rfl:   Social History   Tobacco Use  Smoking Status Every Day   Current packs/day: 0.50   Types: Cigarettes  Smokeless Tobacco Never    Allergies  Allergen Reactions   Gluten Other (See Comments)    Patient has condition that eliminates any Gluten intake   Naproxen  abd pain     Tramadol Nausea And Vomiting   Objective:  There were no vitals filed for this visit. There is no height or weight on file to calculate BMI. Constitutional Well developed. Well nourished.  Vascular Dorsalis pedis pulses palpable bilaterally. Posterior tibial pulses palpable bilaterally. Capillary refill normal to all digits.  No cyanosis or clubbing noted. Pedal hair growth normal.  Neurologic Normal speech. Oriented to person, place, and time. Epicritic sensation to light touch grossly present bilaterally.  Dermatologic Nails well groomed and normal in appearance. No open wounds. No skin lesions.  Orthopedic: Bilateral plantarflexed fifth metatarsal noted.  Pain on palpation.   Radiographs: 3 views of skeletally mature adult bilateral foot: Plantarflexed fifth metatarsal noted.  Pes  cavus foot structure noted posterior heel spurring noted no other abnormalities identified Assessment:   1. Plantar flexed metatarsal bone of right foot   2. Plantar flexed metatarsal bone of left foot    Plan:  Patient was evaluated and treated and all questions answered.  Bilateral fifth plantarflexed metatarsal -All questions and concerns were discussed with the patient in extensive detail she has failed all conservative care including shoe gear modification padding protecting debridement offloading she will benefit from floating osteotomy of the bilateral fifth metatarsal.  I discussed my preoperative intra or postoperative plan with the patient in extensive detail she states understanding and would like to proceed with surgery -Informed surgical risk consent was reviewed and read aloud to the patient.  I reviewed the films.  I have discussed my findings with the patient in great detail.  I have discussed all risks including but not limited to infection, stiffness, scarring, limp, disability, deformity, damage to blood vessels and nerves, numbness, poor healing, need for braces, arthritis, chronic pain, amputation, death.  All benefits and realistic expectations discussed in great detail.  I have made no promises as to the outcome.  I have provided realistic expectations.  I have offered the patient a 2nd opinion, which they have declined and assured me they preferred to proceed despite the risks   No follow-ups on file.

## 2023-01-21 ENCOUNTER — Other Ambulatory Visit: Payer: Self-pay

## 2023-01-21 ENCOUNTER — Emergency Department (HOSPITAL_COMMUNITY)
Admission: EM | Admit: 2023-01-21 | Discharge: 2023-01-21 | Disposition: A | Discharge status: Home / Self Care | Payer: Medicaid Other | Attending: Emergency Medicine | Admitting: Emergency Medicine

## 2023-01-21 ENCOUNTER — Encounter (HOSPITAL_COMMUNITY): Payer: Self-pay

## 2023-01-21 DIAGNOSIS — W269XXA Contact with unspecified sharp object(s), initial encounter: Secondary | ICD-10-CM | POA: Diagnosis not present

## 2023-01-21 DIAGNOSIS — S61411A Laceration without foreign body of right hand, initial encounter: Secondary | ICD-10-CM | POA: Insufficient documentation

## 2023-01-21 DIAGNOSIS — Z23 Encounter for immunization: Secondary | ICD-10-CM | POA: Diagnosis not present

## 2023-01-21 MED ORDER — LIDOCAINE HCL (PF) 1 % IJ SOLN
10.0000 mL | Freq: Once | INTRAMUSCULAR | Status: AC
  Administered 2023-01-21: 10 mL
  Filled 2023-01-21: qty 10

## 2023-01-21 MED ORDER — TETANUS-DIPHTH-ACELL PERTUSSIS 5-2.5-18.5 LF-MCG/0.5 IM SUSY
0.5000 mL | PREFILLED_SYRINGE | Freq: Once | INTRAMUSCULAR | Status: AC
  Administered 2023-01-21: 0.5 mL via INTRAMUSCULAR
  Filled 2023-01-21: qty 0.5

## 2023-01-21 MED ORDER — BACITRACIN ZINC 500 UNIT/GM EX OINT: 1.0000 | TOPICAL_OINTMENT | Freq: Two times a day (BID) | CUTANEOUS | 0 refills | Status: DC

## 2023-01-21 NOTE — Discharge Instructions (Addendum)
Sutured repair Keep the laceration site dry for the next 24 hours and leave the dressing in place. After 24 hours you may remove the dressing and gently clean the laceration site with antibacterial soap and warm water. Do not scrub the area. Do not soak the area and water for long periods of time. Don't use hydrogen peroxide, iodine-based solutions, or alcohol, which can slow healing, and will probably be painful! Apply topical bacitracin 1-2 times per day for the next 3-5 days. Return to the emergency department (or urgent care or PCP) in 5-7 days for removal of the sutures.  You should return sooner for any signs of infection which would include increased redness around the wound, increased swelling, new drainage of yellow pus.

## 2023-01-21 NOTE — ED Triage Notes (Signed)
Pt was washing dishes and and cut inbetween pinky and ring finger on right hand with piece of broken glass. Bleeding controlled.

## 2023-01-21 NOTE — ED Provider Notes (Signed)
[place] EMERGENCY DEPARTMENT AT [facility] Provider Note   CSN: [id number] Arrival date & time: 01/21/23  1746     History  Chief Complaint  Patient presents with   Laceration   HPI Samantha [name] is a 47 y.o. female presenting for laceration on the right hand.  Occurred about 2 hours ago.  States she was washing a dish off in the sink and sustained a laceration in between her fourth and fifth digit on the right hand.  States her hand was not submerged in water when it occurred.  Unsure of her last tetanus shot.  Reports normal range of motion in the fingers and hand.  Bleeding was controlled with direct pressure.   Laceration      Home Medications Prior to Admission medications   Medication Sig Start Date End Date Taking? Authorizing Provider  bacitracin ointment Apply 1 Application topically 2 (two) times daily. Apply to laceration repair site on the right hand 01/21/23  Yes Gareth Eagle, PA-C  acetaminophen (TYLENOL) 500 MG tablet Take 1,000 mg by mouth every 6 (six) hours as needed.    [provider]  ALPRAZolam Prudy Feeler) 0.5 MG tablet Take 0.5 mg by mouth 2 (two) times daily as needed. 01/23/21   [provider]  buPROPion (WELLBUTRIN) 100 MG tablet Take 100 mg by mouth 2 (two) times daily. 01/23/21   [provider]  busPIRone (BUSPAR) 15 MG tablet Take 15 mg by mouth 3 (three) times daily. 01/23/21   [provider]  citalopram (CELEXA) 20 MG tablet Take by mouth. 04/12/18   [provider]  citalopram (CELEXA) 20 MG tablet Take 1 tablet by mouth daily. 02/16/13   [provider]  cyclobenzaprine (FLEXERIL) 10 MG tablet Take 10 mg by mouth 3 (three) times daily as needed. 06/13/21   [provider]  diclofenac Sodium (VOLTAREN) 1 % GEL Apply 2 g topically 4 (four) times daily. 10/23/19   Joy, Shawn C, PA-C  hydrocortisone cream 1 % Apply to affected area 2 times daily 09/16/20   Rancour, Jeannett Senior, MD   ibuprofen (ADVIL) 800 MG tablet Take 1 tablet (800 mg total) by mouth every 8 (eight) hours as needed. 07/31/21   Redwine, Madison A, PA-C  ondansetron (ZOFRAN-ODT) 8 MG disintegrating tablet Take 1 tablet (8 mg total) by mouth every 8 (eight) hours as needed for nausea or vomiting. 08/02/21   Linwood Dibbles, MD  pregabalin (LYRICA) 50 MG capsule Take by mouth. 05/30/21   [provider]  SUBOXONE 8-2 MG FILM SMARTSIG:0.5 Strip(s) Sublingual Every 4 Hours PRN 05/01/21   [provider]  traZODone (DESYREL) 100 MG tablet Take 200 mg by mouth at bedtime. 05/31/21   [provider]  trazodone (DESYREL) 300 MG tablet Take 300 mg by mouth at bedtime. 01/23/21   [provider]  Vitamin D, Ergocalciferol, (DRISDOL) 1.25 MG (50000 UNIT) CAPS capsule Take 50,000 Units by mouth once a week. 02/11/21   [provider]  VRAYLAR 1.5 MG capsule Take 1.5 mg by mouth. 01/23/21   [provider]      Allergies    Gluten, Naproxen, and Tramadol    Review of Systems   See HPI for pertinent positives  Physical Exam Updated Vital Signs BP (!) 142/78 (BP Location: Right Arm)   Pulse 77   Temp 98.3 F (36.8 C) (Oral)   Resp 18   Ht 5\' 1"  (1.549 m)   Wt 87.5 kg   SpO2  98%   BMI 36.45 kg/m  Physical Exam Constitutional:      Appearance: Normal appearance.  HENT:     Head: Normocephalic.     Nose: Nose normal.  Eyes:     Conjunctiva/sclera: Conjunctivae normal.  Pulmonary:     Effort: Pulmonary effort is normal.  Musculoskeletal:     Right hand: Laceration present. No swelling. Normal range of motion.     Comments: Small 2 cm laceration in between the bases of the fourth and fifth digit on the right hand.  Not actively bleeding.  Range of motion of the hand and fingers appears normal.  Cap refill is brisk.  Radial pulses 2+.  Neurological:     Mental Status: She is alert.  Psychiatric:        Mood and Affect: Mood normal.     ED Results / Procedures /  Treatments   Labs (all labs ordered are listed, but only abnormal results are displayed) Labs Reviewed - No data to display  EKG None  Radiology No results found.  Procedures .Marland KitchenLaceration Repair  Date/Time: 01/21/2023 10:58 PM  Performed by: Gareth Eagle, PA-C Authorized by: Gareth Eagle, PA-C   Consent:    Consent obtained:  Verbal   Consent given by:  Patient   Risks, benefits, and alternatives were discussed: yes     Risks discussed:  Infection, retained foreign body and pain   Alternatives discussed:  No treatment Universal protocol:    Procedure explained and questions answered to patient or proxy's satisfaction: yes     Relevant documents present and verified: yes     Patient identity confirmed:  Verbally with patient and arm band Anesthesia:    Anesthesia method:  Local infiltration   Local anesthetic:  Lidocaine 1% w/o epi Laceration details:    Location:  Hand   Length (cm):  2   Depth (mm):  4 Pre-procedure details:    Preparation:  Patient was prepped and draped in usual sterile fashion Exploration:    Limited defect created (wound extended): no     Hemostasis achieved with:  Direct pressure   Imaging outcome: foreign body not noted     Wound exploration: wound explored through full range of motion     Wound extent: areolar tissue violated     Contaminated: no   Treatment:    Area cleansed with:  Saline and Shur-Clens   Amount of cleaning:  Standard   Irrigation solution:  Sterile saline   Irrigation method:  Pressure wash   Debridement:  None   Undermining:  None Skin repair:    Repair method:  Sutures   Suture size:  4-0   Suture material:  Nylon   Suture technique:  Simple interrupted   Number of sutures:  3 Approximation:    Approximation:  Close Repair type:    Repair type:  Simple Post-procedure details:    Dressing:  Non-adherent dressing   Procedure completion:  Tolerated well, no immediate complications     Medications  Ordered in ED Medications  Tdap (BOOSTRIX) injection 0.5 mL (has no administration in time range)  lidocaine (PF) (XYLOCAINE) 1 % injection 10 mL (10 mLs Infiltration Given 01/21/23 2240)    ED Course/ Medical Decision Making/ A&P                                 Medical Decision Making Risk Prescription drug management.  47 year old well-appearing female presenting for laceration to right hand.  Laceration repair was well-tolerated without complication.  Gave Tdap booster.  Advised wound care at home.  Discussed return precautions.  Discussed signs of infection.  Vital stable.  Discharged home in good condition.        Final Clinical Impression(s) / ED Diagnoses Final diagnoses:  Laceration of right hand, foreign body presence unspecified, initial encounter    Rx / DC Orders ED Discharge Orders          Ordered    bacitracin ointment  2 times daily        01/21/23 2257              Gareth Eagle, PA-C 01/21/23 2301    Benjiman Core, MD 01/21/23 [phone]

## 2023-01-21 NOTE — ED Notes (Signed)
Right hand soaked and cleanse with NS

## 2023-01-23 DIAGNOSIS — Z131 Encounter for screening for diabetes mellitus: Secondary | ICD-10-CM | POA: Diagnosis not present

## 2023-01-23 DIAGNOSIS — N911 Secondary amenorrhea: Secondary | ICD-10-CM | POA: Diagnosis not present

## 2023-01-23 DIAGNOSIS — Z79899 Other long term (current) drug therapy: Secondary | ICD-10-CM | POA: Diagnosis not present

## 2023-01-23 DIAGNOSIS — E7800 Pure hypercholesterolemia, unspecified: Secondary | ICD-10-CM | POA: Diagnosis not present

## 2023-01-23 DIAGNOSIS — M5030 Other cervical disc degeneration, unspecified cervical region: Secondary | ICD-10-CM | POA: Diagnosis not present

## 2023-01-23 DIAGNOSIS — E559 Vitamin D deficiency, unspecified: Secondary | ICD-10-CM | POA: Diagnosis not present

## 2023-01-23 DIAGNOSIS — Z6841 Body Mass Index (BMI) 40.0 and over, adult: Secondary | ICD-10-CM | POA: Diagnosis not present

## 2023-01-28 DIAGNOSIS — Z79899 Other long term (current) drug therapy: Secondary | ICD-10-CM | POA: Diagnosis not present

## 2023-02-03 ENCOUNTER — Telehealth: Payer: Self-pay | Admitting: Podiatry

## 2023-02-03 NOTE — Telephone Encounter (Signed)
DOS-02/09/2023  METATARSAL OSTEOTOMY 5TH BILAT -[id number]  HEALTHY [place]UE MEDICAID EFFECTIVE DATE: 08/25/22  DEDUCTIBLE: $0.00 OOP-$0.00 COINSURANCE: 0%  PER [employer] WEBSITE FOR CPT CODE 28308 HAS BEEN APPROVED, AUTH #: [id number], GOOD FROM 9/16-11/14/24.

## 2023-02-09 ENCOUNTER — Other Ambulatory Visit: Payer: Self-pay | Admitting: Podiatry

## 2023-02-09 DIAGNOSIS — M21542 Acquired clubfoot, left foot: Secondary | ICD-10-CM | POA: Diagnosis not present

## 2023-02-09 DIAGNOSIS — M205X1 Other deformities of toe(s) (acquired), right foot: Secondary | ICD-10-CM | POA: Diagnosis not present

## 2023-02-09 DIAGNOSIS — M205X2 Other deformities of toe(s) (acquired), left foot: Secondary | ICD-10-CM | POA: Diagnosis not present

## 2023-02-09 DIAGNOSIS — M21541 Acquired clubfoot, right foot: Secondary | ICD-10-CM | POA: Diagnosis not present

## 2023-02-09 DIAGNOSIS — D492 Neoplasm of unspecified behavior of bone, soft tissue, and skin: Secondary | ICD-10-CM | POA: Diagnosis not present

## 2023-02-09 MED ORDER — OXYCODONE-ACETAMINOPHEN 5-325 MG PO TABS: 1.0000 | ORAL_TABLET | ORAL | 0 refills | Status: DC | PRN

## 2023-02-11 ENCOUNTER — Telehealth: Payer: Self-pay | Admitting: Podiatry

## 2023-02-11 MED ORDER — HYDROCODONE-ACETAMINOPHEN 10-325 MG PO TABS: 1.0000 | ORAL_TABLET | Freq: Three times a day (TID) | ORAL | 0 refills | Status: AC | PRN

## 2023-02-11 NOTE — Telephone Encounter (Signed)
pt called in and states that the pain medication she has is not working, she still can't move around, she would like you to send something new in

## 2023-02-18 ENCOUNTER — Ambulatory Visit: Payer: Medicaid Other | Admitting: Podiatry

## 2023-02-18 ENCOUNTER — Ambulatory Visit (INDEPENDENT_AMBULATORY_CARE_PROVIDER_SITE_OTHER): Payer: Medicaid Other

## 2023-02-18 DIAGNOSIS — M216X1 Other acquired deformities of right foot: Secondary | ICD-10-CM

## 2023-02-18 DIAGNOSIS — Z9889 Other specified postprocedural states: Secondary | ICD-10-CM

## 2023-02-18 DIAGNOSIS — M21542 Acquired clubfoot, left foot: Secondary | ICD-10-CM | POA: Diagnosis not present

## 2023-02-18 DIAGNOSIS — M216X2 Other acquired deformities of left foot: Secondary | ICD-10-CM

## 2023-02-18 MED ORDER — OXYCODONE-ACETAMINOPHEN 5-325 MG PO TABS
1.0000 | ORAL_TABLET | ORAL | 0 refills | Status: DC | PRN
Start: 2023-02-18 — End: 2023-12-14

## 2023-02-18 NOTE — Progress Notes (Signed)
Subjective:  Patient ID: Samantha [name], female    DOB: 1976-02-25,  MRN: [id number]  Chief Complaint  Patient presents with   Routine Post Op    POV # 1 DOS 02/09/2023 --- Skeet Latch FLOATING OSTEOTOMY 5TH    DOS: 02/09/2023 Procedure: Bilateral floating osteotomy of the fifth  47 y.o. female returns for post-op check.  Patient states she is doing well.  Pain controlled.  Bandages clean dry and intact.  Weightbearing as tolerated in surgical shoe bilateral  Review of Systems: Negative except as noted in the HPI. Denies N/V/F/Ch.  Past Medical History:  Diagnosis Date   Anxiety disorder    Arrhythmia    Celiac disease    Chronic pain    DDD (degenerative disc disease)    Degenerative disc disease, lumbar    Depression    Interstitial cystitis    Kidney stones    Panic disorder    PTSD (post-traumatic stress disorder)    Suicidal risk    SVT (supraventricular tachycardia)     Current Outpatient Medications:    oxyCODONE-acetaminophen (PERCOCET) 5-325 MG tablet, Take 1 tablet by mouth every 4 (four) hours as needed for severe pain., Disp: 30 tablet, Rfl: 0   acetaminophen (TYLENOL) 500 MG tablet, Take 1,000 mg by mouth every 6 (six) hours as needed., Disp: , Rfl:    ALPRAZolam (XANAX) 0.5 MG tablet, Take 0.5 mg by mouth 2 (two) times daily as needed., Disp: , Rfl:    bacitracin ointment, Apply 1 Application topically 2 (two) times daily. Apply to laceration repair site on the right hand, Disp: 120 g, Rfl: 0   buPROPion (WELLBUTRIN) 100 MG tablet, Take 100 mg by mouth 2 (two) times daily., Disp: , Rfl:    busPIRone (BUSPAR) 15 MG tablet, Take 15 mg by mouth 3 (three) times daily., Disp: , Rfl:    citalopram (CELEXA) 20 MG tablet, Take by mouth., Disp: , Rfl:    citalopram (CELEXA) 20 MG tablet, Take 1 tablet by mouth daily., Disp: , Rfl:    cyclobenzaprine (FLEXERIL) 10 MG tablet, Take 10 mg by mouth 3 (three) times daily as needed., Disp: , Rfl:    diclofenac Sodium (VOLTAREN)  1 % GEL, Apply 2 g topically 4 (four) times daily., Disp: 100 g, Rfl: 1   hydrocortisone cream 1 %, Apply to affected area 2 times daily, Disp: 15 g, Rfl: 0   ibuprofen (ADVIL) 800 MG tablet, Take 1 tablet (800 mg total) by mouth every 8 (eight) hours as needed., Disp: 30 tablet, Rfl: 0   ondansetron (ZOFRAN-ODT) 8 MG disintegrating tablet, Take 1 tablet (8 mg total) by mouth every 8 (eight) hours as needed for nausea or vomiting., Disp: 20 tablet, Rfl: 0   oxyCODONE-acetaminophen (PERCOCET) 5-325 MG tablet, Take 1 tablet by mouth every 4 (four) hours as needed for severe pain., Disp: 30 tablet, Rfl: 0   pregabalin (LYRICA) 50 MG capsule, Take by mouth., Disp: , Rfl:    SUBOXONE 8-2 MG FILM, SMARTSIG:0.5 Strip(s) Sublingual Every 4 Hours PRN, Disp: , Rfl:    traZODone (DESYREL) 100 MG tablet, Take 200 mg by mouth at bedtime., Disp: , Rfl:    trazodone (DESYREL) 300 MG tablet, Take 300 mg by mouth at bedtime., Disp: , Rfl:    Vitamin D, Ergocalciferol, (DRISDOL) 1.25 MG (50000 UNIT) CAPS capsule, Take 50,000 Units by mouth once a week., Disp: , Rfl:    VRAYLAR 1.5 MG capsule, Take 1.5 mg by mouth., Disp: , Rfl:  Social History   Tobacco Use  Smoking Status Every Day   Current packs/day: 0.50   Types: Cigarettes  Smokeless Tobacco Never    Allergies  Allergen Reactions   Gluten Other (See Comments)    Patient has condition that eliminates any Gluten intake   Naproxen     abd pain     Tramadol Nausea And Vomiting   Objective:  There were no vitals filed for this visit. There is no height or weight on file to calculate BMI. Constitutional Well developed. Well nourished.  Vascular Foot warm and well perfused. Capillary refill normal to all digits.   Neurologic Normal speech. Oriented to person, place, and time. Epicritic sensation to light touch grossly present bilaterally.  Dermatologic Skin healing well without signs of infection. Skin edges well coapted without signs of  infection.  Orthopedic: Tenderness to palpation noted about the surgical site.   Radiographs: Bilateral metatarsal osteotomy noted to the fifth good correction alignment noted.  Reduction of deformity noted. Assessment:   1. Plantar flexed metatarsal bone of right foot   2. Plantar flexed metatarsal bone of left foot   3. Status post foot surgery    Plan:  Patient was evaluated and treated and all questions answered.  S/p foot surgery bilaterally -Progressing as expected post-operatively. -XR: See above -WB Status: Weightbearing as tolerated in surgical shoe -Sutures: Intact.  No clinical signs of dehiscence noted no complication noted. -Medications: None -Foot redressed.  No follow-ups on file.

## 2023-02-20 DIAGNOSIS — M5030 Other cervical disc degeneration, unspecified cervical region: Secondary | ICD-10-CM | POA: Diagnosis not present

## 2023-02-20 DIAGNOSIS — Z79899 Other long term (current) drug therapy: Secondary | ICD-10-CM | POA: Diagnosis not present

## 2023-02-20 DIAGNOSIS — Z6841 Body Mass Index (BMI) 40.0 and over, adult: Secondary | ICD-10-CM | POA: Diagnosis not present

## 2023-02-20 DIAGNOSIS — E7800 Pure hypercholesterolemia, unspecified: Secondary | ICD-10-CM | POA: Diagnosis not present

## 2023-02-20 DIAGNOSIS — Z5320 Procedure and treatment not carried out because of patient's decision for unspecified reasons: Secondary | ICD-10-CM | POA: Diagnosis not present

## 2023-02-24 DIAGNOSIS — Z79899 Other long term (current) drug therapy: Secondary | ICD-10-CM | POA: Diagnosis not present

## 2023-02-25 DIAGNOSIS — Z6841 Body Mass Index (BMI) 40.0 and over, adult: Secondary | ICD-10-CM | POA: Diagnosis not present

## 2023-02-25 DIAGNOSIS — F332 Major depressive disorder, recurrent severe without psychotic features: Secondary | ICD-10-CM | POA: Diagnosis not present

## 2023-02-25 DIAGNOSIS — Z79899 Other long term (current) drug therapy: Secondary | ICD-10-CM | POA: Diagnosis not present

## 2023-03-04 ENCOUNTER — Encounter: Payer: Self-pay | Admitting: Podiatry

## 2023-03-04 ENCOUNTER — Encounter: Payer: Medicaid Other | Admitting: Podiatry

## 2023-03-04 ENCOUNTER — Ambulatory Visit: Payer: Medicaid Other | Admitting: Podiatry

## 2023-03-04 DIAGNOSIS — Z9889 Other specified postprocedural states: Secondary | ICD-10-CM

## 2023-03-04 DIAGNOSIS — M216X2 Other acquired deformities of left foot: Secondary | ICD-10-CM

## 2023-03-04 DIAGNOSIS — M216X1 Other acquired deformities of right foot: Secondary | ICD-10-CM

## 2023-03-04 NOTE — Progress Notes (Signed)
Subjective:  Patient ID: Samantha [name], female    DOB: [birth year],  MRN: [id number]  Chief Complaint  Patient presents with   Routine Post Op    "The left one feels okay but the right one still hurts."    DOS: 02/09/2023 Procedure: Bilateral floating osteotomy of the fifth  47 y.o. female returns for post-op check.  Patient states she is doing well.  Pain controlled.  Bandages clean dry and intact.  Weightbearing as tolerated in surgical shoe bilateral  Review of Systems: Negative except as noted in the HPI. Denies N/V/F/Ch.  Past Medical History:  Diagnosis Date   Anxiety disorder    Arrhythmia    Celiac disease    Chronic pain    DDD (degenerative disc disease)    Degenerative disc disease, lumbar    Depression    Interstitial cystitis    Kidney stones    Panic disorder    PTSD (post-traumatic stress disorder)    Suicidal risk    SVT (supraventricular tachycardia) (HCC)     Current Outpatient Medications:    acetaminophen (TYLENOL) 500 MG tablet, Take 1,000 mg by mouth every 6 (six) hours as needed., Disp: , Rfl:    ALPRAZolam (XANAX) 0.5 MG tablet, Take 0.5 mg by mouth 2 (two) times daily as needed., Disp: , Rfl:    bacitracin ointment, Apply 1 Application topically 2 (two) times daily. Apply to laceration repair site on the right hand, Disp: 120 g, Rfl: 0   oxyCODONE-acetaminophen (PERCOCET) 5-325 MG tablet, Take 1 tablet by mouth every 4 (four) hours as needed for severe pain., Disp: 30 tablet, Rfl: 0   oxyCODONE-acetaminophen (PERCOCET) 5-325 MG tablet, Take 1 tablet by mouth every 4 (four) hours as needed for severe pain., Disp: 30 tablet, Rfl: 0   buPROPion (WELLBUTRIN) 100 MG tablet, Take 100 mg by mouth 2 (two) times daily., Disp: , Rfl:    busPIRone (BUSPAR) 15 MG tablet, Take 15 mg by mouth 3 (three) times daily., Disp: , Rfl:    citalopram (CELEXA) 20 MG tablet, Take by mouth., Disp: , Rfl:    citalopram (CELEXA) 20 MG tablet, Take 1 tablet by mouth daily.,  Disp: , Rfl:    cyclobenzaprine (FLEXERIL) 10 MG tablet, Take 10 mg by mouth 3 (three) times daily as needed., Disp: , Rfl:    diclofenac Sodium (VOLTAREN) 1 % GEL, Apply 2 g topically 4 (four) times daily., Disp: 100 g, Rfl: 1   hydrocortisone cream 1 %, Apply to affected area 2 times daily, Disp: 15 g, Rfl: 0   ibuprofen (ADVIL) 800 MG tablet, Take 1 tablet (800 mg total) by mouth every 8 (eight) hours as needed., Disp: 30 tablet, Rfl: 0   ondansetron (ZOFRAN-ODT) 8 MG disintegrating tablet, Take 1 tablet (8 mg total) by mouth every 8 (eight) hours as needed for nausea or vomiting., Disp: 20 tablet, Rfl: 0   pregabalin (LYRICA) 50 MG capsule, Take by mouth., Disp: , Rfl:    SUBOXONE 8-2 MG FILM, SMARTSIG:0.5 Strip(s) Sublingual Every 4 Hours PRN, Disp: , Rfl:    traZODone (DESYREL) 100 MG tablet, Take 200 mg by mouth at bedtime., Disp: , Rfl:    trazodone (DESYREL) 300 MG tablet, Take 300 mg by mouth at bedtime., Disp: , Rfl:    Vitamin D, Ergocalciferol, (DRISDOL) 1.25 MG (50000 UNIT) CAPS capsule, Take 50,000 Units by mouth once a week., Disp: , Rfl:    VRAYLAR 1.5 MG capsule, Take 1.5 mg by mouth., Disp: ,  Rfl:   Social History   Tobacco Use  Smoking Status Every Day   Current packs/day: 0.50   Types: Cigarettes, E-cigarettes  Smokeless Tobacco Never    Allergies  Allergen Reactions   Gluten Other (See Comments)    Patient has condition that eliminates any Gluten intake   Naproxen     abd pain     Tramadol Nausea And Vomiting   Objective:  There were no vitals filed for this visit. There is no height or weight on file to calculate BMI. Constitutional Well developed. Well nourished.  Vascular Foot warm and well perfused. Capillary refill normal to all digits.   Neurologic Normal speech. Oriented to person, place, and time. Epicritic sensation to light touch grossly present bilaterally.  Dermatologic Skin completely epithelialized.  No signs of dehiscence noted no  complication noted.  Orthopedic: No further tenderness to palpation noted about the surgical site.   Radiographs: Bilateral metatarsal osteotomy noted to the fifth good correction alignment noted.  Reduction of deformity noted. Assessment:   No diagnosis found.  Plan:  Patient was evaluated and treated and all questions answered.  S/p foot surgery bilaterally -Clinically healed and officially discharged from my care if any foot and ankle use arise in the future she will come back and see me.  I discussed shoe gear modification.  Return to regular activity  No follow-ups on file.

## 2023-03-30 DIAGNOSIS — Z7901 Long term (current) use of anticoagulants: Secondary | ICD-10-CM | POA: Diagnosis not present

## 2023-03-30 DIAGNOSIS — B182 Chronic viral hepatitis C: Secondary | ICD-10-CM | POA: Diagnosis not present

## 2023-03-30 DIAGNOSIS — R7401 Elevation of levels of liver transaminase levels: Secondary | ICD-10-CM | POA: Diagnosis not present

## 2023-03-30 DIAGNOSIS — K7460 Unspecified cirrhosis of liver: Secondary | ICD-10-CM | POA: Diagnosis not present

## 2023-04-08 DIAGNOSIS — M5030 Other cervical disc degeneration, unspecified cervical region: Secondary | ICD-10-CM | POA: Diagnosis not present

## 2023-04-08 DIAGNOSIS — Z79899 Other long term (current) drug therapy: Secondary | ICD-10-CM | POA: Diagnosis not present

## 2023-04-08 DIAGNOSIS — Z6841 Body Mass Index (BMI) 40.0 and over, adult: Secondary | ICD-10-CM | POA: Diagnosis not present

## 2023-04-08 DIAGNOSIS — R030 Elevated blood-pressure reading, without diagnosis of hypertension: Secondary | ICD-10-CM | POA: Diagnosis not present

## 2023-04-08 DIAGNOSIS — E7800 Pure hypercholesterolemia, unspecified: Secondary | ICD-10-CM | POA: Diagnosis not present

## 2023-04-08 DIAGNOSIS — T8142XA Infection following a procedure, deep incisional surgical site, initial encounter: Secondary | ICD-10-CM | POA: Diagnosis not present

## 2023-04-21 DIAGNOSIS — R7401 Elevation of levels of liver transaminase levels: Secondary | ICD-10-CM | POA: Diagnosis not present

## 2023-04-28 DIAGNOSIS — R768 Other specified abnormal immunological findings in serum: Secondary | ICD-10-CM | POA: Diagnosis not present

## 2023-05-04 DIAGNOSIS — H5213 Myopia, bilateral: Secondary | ICD-10-CM | POA: Diagnosis not present

## 2023-05-08 DIAGNOSIS — M5030 Other cervical disc degeneration, unspecified cervical region: Secondary | ICD-10-CM | POA: Diagnosis not present

## 2023-05-08 DIAGNOSIS — Z6841 Body Mass Index (BMI) 40.0 and over, adult: Secondary | ICD-10-CM | POA: Diagnosis not present

## 2023-05-08 DIAGNOSIS — Z79899 Other long term (current) drug therapy: Secondary | ICD-10-CM | POA: Diagnosis not present

## 2023-05-08 DIAGNOSIS — M549 Dorsalgia, unspecified: Secondary | ICD-10-CM | POA: Diagnosis not present

## 2023-05-08 DIAGNOSIS — M545 Low back pain, unspecified: Secondary | ICD-10-CM | POA: Diagnosis not present

## 2023-05-12 DIAGNOSIS — Z79899 Other long term (current) drug therapy: Secondary | ICD-10-CM | POA: Diagnosis not present

## 2023-05-22 NOTE — H&P (Signed)
 ------------------------------------------------------------------------------- Attestation signed by Juliene Bernardino Balder, MD at 05/25/2023 10:40 AM Agree with note.  Risks and benefits of US  guided liver biopsy was discussed with the patient including, but not limited to bleeding, infection, damage to adjacent structures or low yield requiring additional tests.  The patient has been informed how conscious sedation is performed and understands that all sedation medications involve risks of complications and serious possible damage to vital organs such as the brain, heart, lung, liver, and kidney, and that in some cases use of these medications may result in cardiac arrest, and/or brain death from both known and unknown causes. The patient has been informed that, during the course of the conscious sedation, unforeseen conditions may necessitate additional or different procedures than set forth above.   All of the patient's questions were answered, patient is agreeable to proceed.  Consent signed and in chart.]  -------------------------------------------------------------------------------    Chief Complaint: Elevated LFT  Referring Physician(s): Milagros Rush   Supervising Physician: Balder Juliene   Patient Status: HP- out pt  History of Present Illness: Samantha [name] is a 47 y.o. female with PMHs significant for HLD, DM, IVDU, hep C and elevated LFT who presents for random liver bx.   Patient is being followed by [facility] medical GU due to elevated LFT. Patient underwent serologic work up and fibrosis scan which showed features suggestive of cirrhosis. Random liver bx for further eval was recommended to the patient which she decided to proceed.  Patient laying in bed comfortably. Denies headache, fever, chills, shortness of breath, cough, chest pain, nausea, vomiting, abdominal pain, back pain, and bleeding.    Patient states that she has not used IV drug for 2 years, she was notified that  one of the sedation mediation that IR use is Fentanyl , she verbalized understanding.   Past Medical History:  Diagnosis Date  . Anxiety   . Bipolar depression (CMD)   . Celiac disease   . Degenerative disc disease, lumbar   . Depression   . Hepatitis C   . Hypercholesterolemia   . Hypothyroidism     Past Surgical History:  Procedure Laterality Date  . ABDOMINAL HYSTERECTOMY    . APPENDECTOMY    . CESAREAN SECTION    . OSTEOTOMY Bilateral   . TAH-BSO (TOTAL ABDOMINAL HYSTERECTOMY W/ BILATERAL SALPINGOOPHORECTOMY Bilateral   . TONSILLECTOMY    . UMBILICAL HERNIA REPAIR      No family history on file.  Social History   Socioeconomic History  . Marital status: Single    Spouse name: None  . Number of children: None  . Years of education: None  . Highest education level: None  Occupational History  . None  Tobacco Use  . Smoking status: Every Day    Current packs/day: 0.50    Average packs/day: 0.5 packs/day for 28.0 years (14.0 ttl pk-yrs)    Types: Cigarettes    Start date: 71  . Smokeless tobacco: Never  Vaping Use  . Vaping status: Some Days  . Substances: CBD  Substance and Sexual Activity  . Alcohol  use: Never  . Drug use: Not Currently    Types: Cocaine, Marijuana    Comment: reports not using for 2 years  . Sexual activity: Yes    Birth control/protection: Surgical  Other Topics Concern  . None  Social History Narrative  . None   Social Drivers of Health   Food Insecurity: Not on file  Transportation Needs: Not on file  Safety: Not on file  Living Situation: Not on file    Allergies: Gluten and Tramadol   Medications: Prior to Admission medications   Not on File    Review of Systems: A 12 point ROS discussed and pertinent positives are indicated in the HPI above.  All other systems are negative.   Vital Signs: BP 105/72   Pulse 79   Temp 97.7 F (36.5 C) (Temporal)   Resp 20   Ht 1.549 m (5' 1)   Wt 102 kg (224 lb)   SpO2 94%    BMI 42.32 kg/m  Body mass index is 42.32 kg/m.   Physical Exam Vitals and nursing note reviewed.  Constitutional:      General: Patient is not in acute distress.    Appearance: Normal appearance. Patient is not ill-appearing.  HENT:     Head: Normocephalic and atraumatic.     Mouth/Throat:     Mouth: Mucous membranes are moist.     Pharynx: Oropharynx is clear.  Cardiovascular:     Rate and Rhythm: Normal rate and regular rhythm.     Pulses: Normal pulses.     Heart sounds: Normal heart sounds.  Pulmonary:     Effort: Pulmonary effort is normal.     Breath sounds: Normal breath sounds.  Abdominal:     General: Abdomen is flat. Bowel sounds are normal.     Palpations: Abdomen is soft.  Musculoskeletal:     Cervical back: Neck supple.  Skin:    General: Skin is warm and dry.     Coloration: Skin is not jaundiced or pale.  Neurological:     Mental Status: Patient is alert and oriented to person, place, and time.  Psychiatric:        Mood and Affect: Mood normal.        Behavior: Behavior normal.        Judgment: Judgment normal.     Labs: Results:  Comprehensive Oncology Labs:  Oncology Labs       05/25/2023   08:45  Labs - Hematology  WBC 6.83   RBC 4.70   Hemoglobin 14.4   Hematocrit 41.9   Mean Corpuscular Volume (MCV) 89.2   MCH 30.7   Mean Corpuscular Hemoglobin Conc (MCHC) 34.4   Red Cell Distribution Width (RDW-CV) 14.5   Platelet Count (Plt) 238   Mean Platelet Volume (MPV) 7.5   PT (Protime) 13.7   Labs - Coagulation  PT (Protime) 13.7   INR 1.1      Mallampati Class:   II (hard and soft palate, upper portion of tonsils anduvula visible)  ASA Grade: ASA 2 - Patient with mild systemic disease with no functional limitations   Assessment and Plan:  47 y.o. female with hx of untreated hep C and elevated LFT who presents for random liver bx.  NPO since MN VSS CBC wnl INR 1.1  AC/AP: none  Allergies reviewed, allergic to ASA per notr  from [facility] medical.Reaction hive.   Risks and benefits of random liver dx was discussed with the patient including, but not limited to bleeding, infection, damage to adjacent structures or low yield requiring additional tests.  The patient has been informed how conscious sedation is performed and understands that all sedation medications involve risks of complications and serious possible damage to vital organs such as the brain, heart, lung, liver, and kidney, and that in some cases use of these medications may result in cardiac arrest, and/or brain death from both known and unknown causes. The patient  has been informed that, during the course of the conscious sedation, unforeseen conditions may necessitate additional or different procedures than set forth above.   All of the patient's questions were answered, patient is agreeable to proceed.  Consent signed and in chart.    Signed: Aimee Han, PA-C     I spent a total of 30 minutes in face to face in clinical consultation, greater than 50% of which was counseling/coordinating care for random liver bx.

## 2023-05-25 DIAGNOSIS — K7589 Other specified inflammatory liver diseases: Secondary | ICD-10-CM | POA: Diagnosis not present

## 2023-05-25 DIAGNOSIS — K759 Inflammatory liver disease, unspecified: Secondary | ICD-10-CM | POA: Diagnosis not present

## 2023-05-25 DIAGNOSIS — K760 Fatty (change of) liver, not elsewhere classified: Secondary | ICD-10-CM | POA: Diagnosis not present

## 2023-05-25 NOTE — Anesthesia Postprocedure Evaluation (Signed)
    Interventional Radiology Procedure Note     Interventional Radiologist: Philip   Procedure: US  guided liver biopsy   Indication: Evaluate for autoimmune hepatitis. Abnormal LFTs   Findings: 3 cores from right hepatic lobe   Estimated blood loss (if applicable): Minimal   Specimens removed (if applicable): 3 core biopsies   Postoperative diagnosis : Abnormal LFTs  ADAM BERNARDINO PHILIP, MD

## 2023-06-03 ENCOUNTER — Emergency Department (HOSPITAL_COMMUNITY)
Admission: EM | Admit: 2023-06-03 | Discharge: 2023-06-03 | Discharge status: Against Medical Advice | Payer: Medicaid Other | Attending: Emergency Medicine | Admitting: Emergency Medicine

## 2023-06-03 ENCOUNTER — Other Ambulatory Visit: Payer: Self-pay

## 2023-06-03 DIAGNOSIS — Z5321 Procedure and treatment not carried out due to patient leaving prior to being seen by health care provider: Secondary | ICD-10-CM | POA: Insufficient documentation

## 2023-06-03 DIAGNOSIS — R234 Changes in skin texture: Secondary | ICD-10-CM | POA: Diagnosis not present

## 2023-06-03 DIAGNOSIS — T22031A Burn of unspecified degree of right upper arm, initial encounter: Secondary | ICD-10-CM | POA: Insufficient documentation

## 2023-06-03 DIAGNOSIS — T22139A Burn of first degree of unspecified upper arm, initial encounter: Secondary | ICD-10-CM | POA: Diagnosis not present
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
## 2023-06-05 DIAGNOSIS — Z3200 Encounter for pregnancy test, result unknown: Secondary | ICD-10-CM | POA: Diagnosis not present

## 2023-06-05 DIAGNOSIS — Z6841 Body Mass Index (BMI) 40.0 and over, adult: Secondary | ICD-10-CM | POA: Diagnosis not present

## 2023-06-05 DIAGNOSIS — M5030 Other cervical disc degeneration, unspecified cervical region: Secondary | ICD-10-CM | POA: Diagnosis not present

## 2023-06-05 DIAGNOSIS — Z79899 Other long term (current) drug therapy: Secondary | ICD-10-CM | POA: Diagnosis not present

## 2023-06-05 DIAGNOSIS — T2220XA Burn of second degree of shoulder and upper limb, except wrist and hand, unspecified site, initial encounter: Secondary | ICD-10-CM | POA: Diagnosis not present

## 2023-06-10 DIAGNOSIS — Z79899 Other long term (current) drug therapy: Secondary | ICD-10-CM | POA: Diagnosis not present

## 2023-07-06 DIAGNOSIS — Z79899 Other long term (current) drug therapy: Secondary | ICD-10-CM | POA: Diagnosis not present

## 2023-07-06 DIAGNOSIS — Z6841 Body Mass Index (BMI) 40.0 and over, adult: Secondary | ICD-10-CM | POA: Diagnosis not present

## 2023-07-06 DIAGNOSIS — Z3200 Encounter for pregnancy test, result unknown: Secondary | ICD-10-CM | POA: Diagnosis not present

## 2023-07-06 DIAGNOSIS — E039 Hypothyroidism, unspecified: Secondary | ICD-10-CM | POA: Diagnosis not present

## 2023-07-06 DIAGNOSIS — M5030 Other cervical disc degeneration, unspecified cervical region: Secondary | ICD-10-CM | POA: Diagnosis not present

## 2023-07-09 DIAGNOSIS — Z79899 Other long term (current) drug therapy: Secondary | ICD-10-CM | POA: Diagnosis not present

## 2023-07-09 DIAGNOSIS — B182 Chronic viral hepatitis C: Secondary | ICD-10-CM | POA: Diagnosis not present

## 2023-07-09 DIAGNOSIS — K754 Autoimmune hepatitis: Secondary | ICD-10-CM | POA: Diagnosis not present

## 2023-07-17 DIAGNOSIS — Z6841 Body Mass Index (BMI) 40.0 and over, adult: Secondary | ICD-10-CM | POA: Diagnosis not present

## 2023-07-17 DIAGNOSIS — Z79899 Other long term (current) drug therapy: Secondary | ICD-10-CM | POA: Diagnosis not present

## 2023-07-17 DIAGNOSIS — F332 Major depressive disorder, recurrent severe without psychotic features: Secondary | ICD-10-CM | POA: Diagnosis not present

## 2023-07-30 DIAGNOSIS — Z3200 Encounter for pregnancy test, result unknown: Secondary | ICD-10-CM | POA: Diagnosis not present

## 2023-07-30 DIAGNOSIS — Z79899 Other long term (current) drug therapy: Secondary | ICD-10-CM | POA: Diagnosis not present

## 2023-07-30 DIAGNOSIS — Z6841 Body Mass Index (BMI) 40.0 and over, adult: Secondary | ICD-10-CM | POA: Diagnosis not present

## 2023-07-30 DIAGNOSIS — E039 Hypothyroidism, unspecified: Secondary | ICD-10-CM | POA: Diagnosis not present

## 2023-07-30 DIAGNOSIS — M5030 Other cervical disc degeneration, unspecified cervical region: Secondary | ICD-10-CM | POA: Diagnosis not present

## 2023-08-03 DIAGNOSIS — Z79899 Other long term (current) drug therapy: Secondary | ICD-10-CM | POA: Diagnosis not present

## 2023-08-27 DIAGNOSIS — E559 Vitamin D deficiency, unspecified: Secondary | ICD-10-CM | POA: Diagnosis not present

## 2023-08-27 DIAGNOSIS — D539 Nutritional anemia, unspecified: Secondary | ICD-10-CM | POA: Diagnosis not present

## 2023-08-27 DIAGNOSIS — L732 Hidradenitis suppurativa: Secondary | ICD-10-CM | POA: Diagnosis not present

## 2023-08-27 DIAGNOSIS — Z0000 Encounter for general adult medical examination without abnormal findings: Secondary | ICD-10-CM | POA: Diagnosis not present

## 2023-08-27 DIAGNOSIS — Z7251 High risk heterosexual behavior: Secondary | ICD-10-CM | POA: Diagnosis not present

## 2023-08-27 DIAGNOSIS — E039 Hypothyroidism, unspecified: Secondary | ICD-10-CM | POA: Diagnosis not present

## 2023-08-27 DIAGNOSIS — Z79899 Other long term (current) drug therapy: Secondary | ICD-10-CM | POA: Diagnosis not present

## 2023-08-27 DIAGNOSIS — Z1211 Encounter for screening for malignant neoplasm of colon: Secondary | ICD-10-CM | POA: Diagnosis not present

## 2023-08-27 DIAGNOSIS — M5030 Other cervical disc degeneration, unspecified cervical region: Secondary | ICD-10-CM | POA: Diagnosis not present

## 2023-09-10 DIAGNOSIS — Z1231 Encounter for screening mammogram for malignant neoplasm of breast: Secondary | ICD-10-CM | POA: Diagnosis not present

## 2023-09-14 DIAGNOSIS — B182 Chronic viral hepatitis C: Secondary | ICD-10-CM | POA: Diagnosis not present

## 2023-09-14 DIAGNOSIS — K754 Autoimmune hepatitis: Secondary | ICD-10-CM | POA: Diagnosis not present

## 2023-09-14 DIAGNOSIS — Z1211 Encounter for screening for malignant neoplasm of colon: Secondary | ICD-10-CM | POA: Diagnosis not present

## 2023-09-25 DIAGNOSIS — R7303 Prediabetes: Secondary | ICD-10-CM | POA: Diagnosis not present

## 2023-09-25 DIAGNOSIS — I10 Essential (primary) hypertension: Secondary | ICD-10-CM | POA: Diagnosis not present

## 2023-09-25 DIAGNOSIS — R569 Unspecified convulsions: Secondary | ICD-10-CM | POA: Diagnosis not present

## 2023-09-25 DIAGNOSIS — E559 Vitamin D deficiency, unspecified: Secondary | ICD-10-CM | POA: Diagnosis not present

## 2023-09-25 DIAGNOSIS — E7800 Pure hypercholesterolemia, unspecified: Secondary | ICD-10-CM | POA: Diagnosis not present

## 2023-09-25 DIAGNOSIS — L732 Hidradenitis suppurativa: Secondary | ICD-10-CM | POA: Diagnosis not present

## 2023-09-25 DIAGNOSIS — F17210 Nicotine dependence, cigarettes, uncomplicated: Secondary | ICD-10-CM | POA: Diagnosis not present

## 2023-09-25 DIAGNOSIS — E039 Hypothyroidism, unspecified: Secondary | ICD-10-CM | POA: Diagnosis not present

## 2023-09-25 DIAGNOSIS — M5030 Other cervical disc degeneration, unspecified cervical region: Secondary | ICD-10-CM | POA: Diagnosis not present

## 2023-09-25 DIAGNOSIS — Z79899 Other long term (current) drug therapy: Secondary | ICD-10-CM | POA: Diagnosis not present

## 2023-09-30 DIAGNOSIS — Z79899 Other long term (current) drug therapy: Secondary | ICD-10-CM | POA: Diagnosis not present

## 2023-10-14 DIAGNOSIS — Z79899 Other long term (current) drug therapy: Secondary | ICD-10-CM | POA: Diagnosis not present

## 2023-10-14 DIAGNOSIS — F3130 Bipolar disorder, current episode depressed, mild or moderate severity, unspecified: Secondary | ICD-10-CM | POA: Diagnosis not present

## 2023-10-14 DIAGNOSIS — Z6841 Body Mass Index (BMI) 40.0 and over, adult: Secondary | ICD-10-CM | POA: Diagnosis not present

## 2023-10-26 DIAGNOSIS — F17210 Nicotine dependence, cigarettes, uncomplicated: Secondary | ICD-10-CM | POA: Diagnosis not present

## 2023-10-26 DIAGNOSIS — I10 Essential (primary) hypertension: Secondary | ICD-10-CM | POA: Diagnosis not present

## 2023-10-26 DIAGNOSIS — R7303 Prediabetes: Secondary | ICD-10-CM | POA: Diagnosis not present

## 2023-10-26 DIAGNOSIS — M5030 Other cervical disc degeneration, unspecified cervical region: Secondary | ICD-10-CM | POA: Diagnosis not present

## 2023-10-26 DIAGNOSIS — Z79899 Other long term (current) drug therapy: Secondary | ICD-10-CM | POA: Diagnosis not present

## 2023-10-26 DIAGNOSIS — E039 Hypothyroidism, unspecified: Secondary | ICD-10-CM | POA: Diagnosis not present

## 2023-10-26 DIAGNOSIS — E559 Vitamin D deficiency, unspecified: Secondary | ICD-10-CM | POA: Diagnosis not present

## 2023-10-26 DIAGNOSIS — R569 Unspecified convulsions: Secondary | ICD-10-CM | POA: Diagnosis not present

## 2023-10-26 DIAGNOSIS — L732 Hidradenitis suppurativa: Secondary | ICD-10-CM | POA: Diagnosis not present

## 2023-10-26 DIAGNOSIS — E7800 Pure hypercholesterolemia, unspecified: Secondary | ICD-10-CM | POA: Diagnosis not present

## 2023-10-29 DIAGNOSIS — Z79899 Other long term (current) drug therapy: Secondary | ICD-10-CM | POA: Diagnosis not present

## 2023-11-13 DIAGNOSIS — R569 Unspecified convulsions: Secondary | ICD-10-CM | POA: Diagnosis not present

## 2023-11-13 NOTE — Progress Notes (Signed)
 [facility] [place] NEW PATIENT EVALUATION   Referring Physician:  No ref. provider found Primary Care Physician:  No primary care provider on file.   Patient ID:  Samantha [name] is a 48 y.o. (DOB [birth year]) female.    Subjective   HPI:  Samantha [name] is a 48 y.o. female referred for seizure-like activity.  Patient reports an onset of her symptoms in March 2024.  She reports having 3 seizure-like episodes over a 55-month span.  She then had no seizures for approximately 2 to 3 months.  She then had 2 additional seizures followed by a period of no seizure activity.  Since December 2024, she reports 3 seizures with the last 1 in March 2025.  Seizure activity has been witnessed by multiple family members.  It is reported that patient is having 2 different types of seizures.  The first type of seizure she is having is where the patient's body will become rigid and stiff and then will begin having bilateral upper and lower extremity convulsions.  She has bitten her tongue.  She has had no bowel or bladder incontinence.  Second type of seizure that she is having is where she becomes zoned out and staring off into space and is unresponsive.  Seizure activity can last anywhere from 30 seconds to 3 minutes.  Patient is completely amnestic to the events.  Patient does endorse a postictal phase where she wakes up confused and combative which persist for approximately 10 to 15 minutes.  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  She denies any loss of consciousness from the traumatic brain injuries or intracranial hemorrhages but does report that she has had postconcussive syndrome.  She denies any type of recent illnesses.  She denies any drug or alcohol  use.  She does endorse a significant amount of psychiatric history with a history of depression, PTSD, anxiety, as well as insomnia.  She states that she is  always under significant stress and that she is a  ball of nerves.  She is followed and treated for her psychiatric symptoms by a behavioral health PA at [facility] in [place].  She reports good compliance with her medications.  She denies any other complicating neurological features.   Past Medical History, Past Surgery History, Social History, and Family History were reviewed and updated.    Past Medical History:  Diagnosis Date  . Anemia   . Asthma (*)   . Hepatitis     Past Surgical History:  Procedure Laterality Date  . Appendectomy    . Bilateral oophorectomy    . Cesarean section     x2  . Correction hammer toe    . Hysterectomy    . Salpingectomy Bilateral   . Tonsillectomy and adenoidectomy    . Umbilical hernia repair     x3   Family History  Problem Relation Age of Onset  . Cancer Mother   . Thyroid  disease Mother   . Alcohol  abuse (alcohol  abuse) Mother    Social History   Socioeconomic History  . Marital status: Divorced  Tobacco Use  . Smoking status: Every Day    Current packs/day: 0.50    Average packs/day: 0.5 packs/day for 27.5 years (13.7 ttl pk-yrs)    Types: Cigarettes    Start date: 11    Last attempt to quit: 2000    Passive exposure: Current  . Smokeless tobacco: Never  Vaping  Use  . Vaping status: Every Day      Current Home Medications   Medication Sig  ALPRAZolam  (XANAX ) 0.5 mg tablet Take one tablet (0.5 mg dose) by mouth 2 (two) times a day as needed.  clindamycin  (CLEOCIN  T) 1% external solution Apply 1 mL topically 2 (two) times daily.  desvenlafaxine (PRISTIQ) 50 mg 24 hr tablet Take one tablet (50 mg dose) by mouth daily.  levETIRAcetam  (KEPPRA ) 500 mg tablet Take one tablet (500 mg dose) by mouth 2 (two) times daily.  levothyroxine  sodium (SYNTHROID ,LEVOTHROID,LEVOXYL ) 125 mcg tablet Take one tablet (125 mcg dose) by mouth daily.  naloxone  (NARCAN ) nasal spray (TAKE HOME PACK) one spray by Intranasal route as needed.   oxyCODONE -acetaminophen  (PERCOCET,ENDOCET) 10-325 mg per tablet Take one tablet by mouth 3 (three) times a day.  QUEtiapine fumarate (SEROQUEL) 100 mg tablet Take one tablet (100 mg dose) by mouth at bedtime.    The patient is allergic to gluten, tramadol , and naproxen.  Review of Systems is complete and negative except as noted in History of Present Illness.  Objective    PHYSICAL EXAM: BP 132/74 (BP Location: Left Upper Arm, Patient Position: Sitting)   Pulse 61   Wt 256 lb 9.6 oz (116.4 kg)   SpO2 96%  GENERAL: Awake, alert, in no acute distress Psych: Affect appropriate for situation, patient is calm and cooperative with examination Head: Normocephalic and atraumatic, without obvious abnormality EENT: Normal conjunctivae, dry mucous membranes, no OP obstruction LUNGS: Normal respiratory effort. Non-labored breathing on room air. Clear to auscultation bilaterally. CV: Auscultation: regular rate and rhythm. Extremities: Warm, well perfused, without obvious deformity  MENTAL STATUS EXAM: Orientation: Alert and oriented to person, place and time. Memory: Cooperative, follows commands well. Recent and remote memory normal.. Attention, concentration: Attention span and concentration are normal. Language: Speech is clear and language is normal. Fund of knowledge: Aware of current events, vocabulary appropriate for patient age.   CRANIAL NERVES: CN 2 (Optic): Visual fields intact to confrontation, funduscopic examination without optic disk pallor or edema, retinal vessels are normal. CN 3,4,6 (EOM): Pupils equal and reactive to light. Full extraocular eye movement without nystagmus. CN 5 (Trigeminal): Facial sensation is normal, no weakness of masticatory muscles. CN 7 (Facial): No facial weakness or asymmetry. CN 8 (Auditory): Auditory acuity grossly normal. CN 9,10 (Glossophar): The uvula is midline, the palate elevates symmetrically. CN 11 (spinal access): Normal  sternocleidomastoid and trapezius strength. CN 12 (Hypoglossal): The tongue is midline. No atrophy or fasciculations.   MOTOR: Muscle Strength: Strength - 5/5 and symmetric in the upper and lower extremities, no pronation or drift. Muscle Tone: Tone and muscle bulk are normal in the upper and lower extremities.   REFLEXES:   DTRs - 2+ and symmetrical in all four extremities, plantar responses are flexor bilaterally.   COORDINATION:   Intact finger-to-nose, heel-to-shin, and rapid alternating movements, no tremor.   SENSATION:  Intact to light touch, vibration, pinprick.  No sensory extinction to DSS. Negative Romberg test.  GAIT: Routine gait normal.   DIAGNOSTIC STUDIES:    CT Head w/o contrast - 06/15/2021 -No acute intracranial abnormality  CT Head w/o contrast - 06/03/2021 -No acute intracranial abnormality  LABORATORY STUDIES:   No visits with results within 1 Year(s) from this visit.  Latest known visit with results is:  No results found for any previous visit.      Assessment   48 y.o. female with PMHx as above who presents for evaluation of seizure-like activity.  Patient has had a total of 8 seizures since onset in March 2024.  Seizure activity sounds consistent with generalized tonic-clonic as well as absence type seizures.  Seizures have been witnessed by her family.  Patient is amnestic to the events.  Seizures last anywhere from 30 seconds to 3 minutes.  She does report a postictal phase that lasted approximately 10 to 15 minutes and consisted of confusion and combativeness.  Patient has had CT heads in the past without any abnormalities.  Patient's neurological examination is at her baseline without any focal/lateralizing deficits.  Patient does endorse a history of multiple TBI's as well as multiple psychiatric disorders.  She also endorses significant amount of stress that is constant.  Is difficult to discern what the etiology of the patient's symptoms are.  Patient  could be having true seizures or patient could be having pseudoseizures as oftentimes the physical manifestations of psychiatric disorders can have a physical appearance that is described as seizure-like activity.  As a precaution, I have recommend that we begin Keppra  while we are working up her neurological complaints.  We will also proceed with an EEG as well as an MRI of her brain.  Plan   1. Seizure-like activity (*) (Primary) - EEG Awake and Drowsy; Future - MRI Head WO Contrast; Future - levETIRAcetam  (KEPPRA ) 500 mg tablet; Take one tablet (500 mg dose) by mouth 2 (two) times daily.  Dispense: 60 tablet; Refill: 3  Discussed risk/benefits Monitor for toxicity  Seizure Precautions: - Please refrain from climbing higher than 10 feet - Please refrain from taking a tub bath alone and/or leave the door unlocked while in the shower - Please avoid sleep deprivation and excessive alcohol  use - Please refrain from swimming unsupervised - Please wear a helmet when riding a bike or rollerblades - Please refrain from driving for 6 months since last seizure - Older persons are advised to take reasonable precautions  - You have restrictions with more dangerous activities, such as operating heavy machinery or machines with rapidly moving parts and playing contact sports  - You should speak with your doctor before resuming any of these activities.   We discussed that per [place] law, a person with seizures must surrender his/her driver's license at the [facility] and must not drive until cleared by the [facility], not by a doctor. I have asked that the patient refrain from driving and explained the risks to self and others of injury or death.  The DMV's requirement for driving for an individual with any type of loss of consciousness or altered mentation (including seizure) is that they be seizure-free for 6-12 months. This is the state law and regulated by the [facility]. The Department of Motor Vehicle [facility]) of [address] regulations for loss of consciousness, altered mentation, or seizures state it is the patient's responsibility to report the incidence in the state of [place]  to the [facility].  We will be happy to fill out any paperwork the [facility] has requested. You may drop off or fax paperwork to our office at [phone].  Discussed seizure first aid: during a spell, place patient in middle of room away from objects, place on their side so that vomit may drain from their mouth, do not put anything in their mouth. Note the time at the onset of a seizure so that if the seizure lasts five minutes, medications can be administered to terminate it.   Risks, benefits, and alternatives of the medications and treatment plan prescribed today  were discussed, and patient expressed understanding and agreement with the plan.  All new prescription medications and changes in current prescription dosages were discussed with the patient, including patient education, medication name, use, dosage, potential side effects, drug interactions, consequences of not using/taking and special instructions. Patient expressed understanding. No barriers to adherence.  Follow up in about 3 months (around 02/13/2024).  Electronically Signed: Fonda FREDRIK Minus, DNP, AGNP-C Neurology Nurse Practitioner  [facility] [place] 11/13/2023 1:38 PM  *This note was dictated with voice recognition software. Inadvertently, similar sounding words can, sometimes, get transcribed incorrectly

## 2023-11-25 DIAGNOSIS — F17210 Nicotine dependence, cigarettes, uncomplicated: Secondary | ICD-10-CM | POA: Diagnosis not present

## 2023-11-25 DIAGNOSIS — M5030 Other cervical disc degeneration, unspecified cervical region: Secondary | ICD-10-CM | POA: Diagnosis not present

## 2023-11-25 DIAGNOSIS — E7800 Pure hypercholesterolemia, unspecified: Secondary | ICD-10-CM | POA: Diagnosis not present

## 2023-11-25 DIAGNOSIS — L732 Hidradenitis suppurativa: Secondary | ICD-10-CM | POA: Diagnosis not present

## 2023-11-25 DIAGNOSIS — E039 Hypothyroidism, unspecified: Secondary | ICD-10-CM | POA: Diagnosis not present

## 2023-11-25 DIAGNOSIS — E559 Vitamin D deficiency, unspecified: Secondary | ICD-10-CM | POA: Diagnosis not present

## 2023-11-25 DIAGNOSIS — R569 Unspecified convulsions: Secondary | ICD-10-CM | POA: Diagnosis not present

## 2023-11-25 DIAGNOSIS — Z79899 Other long term (current) drug therapy: Secondary | ICD-10-CM | POA: Diagnosis not present

## 2023-11-25 DIAGNOSIS — R7303 Prediabetes: Secondary | ICD-10-CM | POA: Diagnosis not present

## 2023-11-25 DIAGNOSIS — I10 Essential (primary) hypertension: Secondary | ICD-10-CM | POA: Diagnosis not present

## 2023-12-01 DIAGNOSIS — G2581 Restless legs syndrome: Secondary | ICD-10-CM | POA: Diagnosis not present

## 2023-12-01 DIAGNOSIS — G4750 Parasomnia, unspecified: Secondary | ICD-10-CM | POA: Diagnosis not present

## 2023-12-01 DIAGNOSIS — Z79899 Other long term (current) drug therapy: Secondary | ICD-10-CM | POA: Diagnosis not present

## 2023-12-01 DIAGNOSIS — R569 Unspecified convulsions: Secondary | ICD-10-CM | POA: Diagnosis not present

## 2023-12-01 DIAGNOSIS — G4710 Hypersomnia, unspecified: Secondary | ICD-10-CM | POA: Diagnosis not present

## 2023-12-01 DIAGNOSIS — Z6841 Body Mass Index (BMI) 40.0 and over, adult: Secondary | ICD-10-CM | POA: Diagnosis not present

## 2023-12-01 DIAGNOSIS — I10 Essential (primary) hypertension: Secondary | ICD-10-CM | POA: Diagnosis not present

## 2023-12-01 DIAGNOSIS — M5030 Other cervical disc degeneration, unspecified cervical region: Secondary | ICD-10-CM | POA: Diagnosis not present

## 2023-12-01 DIAGNOSIS — F17210 Nicotine dependence, cigarettes, uncomplicated: Secondary | ICD-10-CM | POA: Diagnosis not present

## 2023-12-14 ENCOUNTER — Other Ambulatory Visit: Payer: Self-pay

## 2023-12-14 ENCOUNTER — Emergency Department (HOSPITAL_COMMUNITY)
Admission: EM | Admit: 2023-12-14 | Discharge: 2023-12-16 | Disposition: A | Discharge status: Other Acute Inpatient Hospital | Attending: Emergency Medicine | Admitting: Emergency Medicine

## 2023-12-14 ENCOUNTER — Encounter (HOSPITAL_COMMUNITY): Payer: Self-pay

## 2023-12-14 DIAGNOSIS — R45851 Suicidal ideations: Secondary | ICD-10-CM | POA: Diagnosis not present

## 2023-12-14 DIAGNOSIS — F419 Anxiety disorder, unspecified: Secondary | ICD-10-CM | POA: Insufficient documentation

## 2023-12-14 DIAGNOSIS — R4589 Other symptoms and signs involving emotional state: Secondary | ICD-10-CM | POA: Diagnosis not present

## 2023-12-14 DIAGNOSIS — Z638 Other specified problems related to primary support group: Secondary | ICD-10-CM

## 2023-12-14 DIAGNOSIS — F32A Depression, unspecified: Secondary | ICD-10-CM | POA: Diagnosis present

## 2023-12-14 LAB — RAPID URINE DRUG SCREEN, HOSP PERFORMED
Amphetamines: NOT DETECTED
Barbiturates: NOT DETECTED
Benzodiazepines: POSITIVE — AB
Cocaine: NOT DETECTED
Opiates: NOT DETECTED
Tetrahydrocannabinol: POSITIVE — AB

## 2023-12-14 LAB — COMPREHENSIVE METABOLIC PANEL WITH GFR
ALT: 21 U/L (ref 0–44)
AST: 17 U/L (ref 15–41)
Albumin: 4.1 g/dL (ref 3.5–5.0)
Alkaline Phosphatase: 60 U/L (ref 38–126)
Anion gap: 11 (ref 5–15)
BUN: 20 mg/dL (ref 6–20)
CO2: 23 mmol/L (ref 22–32)
Calcium: 9 mg/dL (ref 8.9–10.3)
Chloride: 103 mmol/L (ref 98–111)
Creatinine, Ser: 0.89 mg/dL (ref 0.44–1.00)
GFR, Estimated: >60 mL/min (ref >60)
Glucose, Bld: 118 mg/dL — ABNORMAL HIGH (ref 70–99)
Potassium: 3.8 mmol/L (ref 3.5–5.1)
Sodium: 137 mmol/L (ref 135–145)
Total Bilirubin: 0.5 mg/dL (ref 0.0–1.2)
Total Protein: 8.9 g/dL — ABNORMAL HIGH (ref 6.5–8.1)

## 2023-12-14 LAB — CBC WITH DIFFERENTIAL/PLATELET
Abs Immature Granulocytes: 0.06 K/uL (ref 0.00–0.07)
Basophils Absolute: 0.1 K/uL (ref 0.0–0.1)
Basophils Relative: 1 %
Eosinophils Absolute: 0.2 K/uL (ref 0.0–0.5)
Eosinophils Relative: 2 %
HCT: 42.8 % (ref 36.0–46.0)
Hemoglobin: 14.5 g/dL (ref 12.0–15.0)
Immature Granulocytes: 1 %
Lymphocytes Relative: 34 %
Lymphs Abs: 2.8 K/uL (ref 0.7–4.0)
MCH: 30 pg (ref 26.0–34.0)
MCHC: 33.9 g/dL (ref 30.0–36.0)
MCV: 88.4 fL (ref 80.0–100.0)
Monocytes Absolute: 0.7 K/uL (ref 0.1–1.0)
Monocytes Relative: 8 %
Neutro Abs: 4.5 K/uL (ref 1.7–7.7)
Neutrophils Relative %: 54 %
Platelets: 253 K/uL (ref 150–400)
RBC: 4.84 MIL/uL (ref 3.87–5.11)
RDW: 13.4 % (ref 11.5–15.5)
WBC: 8.4 K/uL (ref 4.0–10.5)
nRBC: 0 % (ref 0.0–0.2)

## 2023-12-14 LAB — URINALYSIS, ROUTINE W REFLEX MICROSCOPIC
Bilirubin Urine: NEGATIVE
Glucose, UA: NEGATIVE mg/dL
Hgb urine dipstick: NEGATIVE
Ketones, ur: NEGATIVE mg/dL
Leukocytes,Ua: NEGATIVE
Nitrite: NEGATIVE
Protein, ur: NEGATIVE mg/dL
Specific Gravity, Urine: 1.012 (ref 1.005–1.030)
pH: 5 (ref 5.0–8.0)

## 2023-12-14 LAB — ETHANOL: Alcohol, Ethyl (B): <15 mg/dL (ref <15)

## 2023-12-14 MED ORDER — ALPRAZOLAM 0.5 MG PO TABS: 0.5000 mg | ORAL_TABLET | Freq: Two times a day (BID) | ORAL | Status: DC | PRN

## 2023-12-14 MED ORDER — STERILE WATER FOR INJECTION IJ SOLN
INTRAMUSCULAR | Status: AC
  Filled 2023-12-14: qty 10

## 2023-12-14 MED ORDER — ZIPRASIDONE MESYLATE 20 MG IM SOLR
10.0000 mg | Freq: Once | INTRAMUSCULAR | Status: AC
  Administered 2023-12-14: 10 mg via INTRAMUSCULAR
  Filled 2023-12-14: qty 20

## 2023-12-14 MED ORDER — LEVETIRACETAM 500 MG PO TABS
500.0000 mg | ORAL_TABLET | Freq: Two times a day (BID) | ORAL | Status: DC
  Administered 2023-12-14 – 2023-12-16 (×4): 500 mg via ORAL
  Filled 2023-12-14 (×5): qty 1

## 2023-12-14 MED ORDER — ACETAMINOPHEN 325 MG PO TABS: 650.0000 mg | ORAL_TABLET | ORAL | Status: DC | PRN

## 2023-12-14 MED ORDER — LEVOTHYROXINE SODIUM 50 MCG PO TABS
125.0000 ug | ORAL_TABLET | Freq: Every day | ORAL | Status: DC
  Administered 2023-12-15 – 2023-12-16 (×2): 125 ug via ORAL
  Filled 2023-12-14 (×3): qty 3

## 2023-12-14 MED ORDER — ONDANSETRON HCL 4 MG PO TABS
4.0000 mg | ORAL_TABLET | Freq: Three times a day (TID) | ORAL | Status: DC | PRN
Start: 2023-12-14 — End: 2023-12-16

## 2023-12-14 MED ORDER — ZOLPIDEM TARTRATE 5 MG PO TABS: 5.0000 mg | ORAL_TABLET | Freq: Every evening | ORAL | Status: DC | PRN
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
## 2023-12-14 NOTE — ED Notes (Signed)
 Patient was asked to change into scrubs, on the way to the bathroom she took her shirt off in the hallway. Patient changed into scrubs and wanded by security. Patient is hollering out in the hallway.

## 2023-12-14 NOTE — ED Notes (Signed)
 Patient moved from Hallway 8 to room 15

## 2023-12-14 NOTE — ED Notes (Signed)
 Patient started to act manic when she was told that she couldn't use her phone. Verbal de-escalation did not work. MD was made aware, Geodon  was ordered.
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
## 2023-12-14 NOTE — ED Notes (Signed)
 TTS in progress

## 2023-12-14 NOTE — ED Notes (Addendum)
 Pts IVC paperwork has been efiled at this time.

## 2023-12-14 NOTE — ED Provider Notes (Signed)
  Physical Exam  BP (!) 130/94 (BP Location: Right Arm)   Pulse (!) 111   Temp 98.2 F (36.8 C) (Oral)   Resp 20   Ht 5' 1 (1.549 m)   Wt 103 kg   SpO2 100%   BMI 42.91 kg/m   Physical Exam  Procedures  Procedures  ED Course / MDM    Medical Decision Making Amount and/or Complexity of Data Reviewed Labs: ordered.  Risk OTC drugs. Prescription drug management.   Patient has become more belligerent yelling and screaming.  Will give Geodon .       Patsey Lot, MD 12/14/23 1950

## 2023-12-14 NOTE — ED Notes (Signed)
 Patient stated she had bed bugs at her house. Community education officer.

## 2023-12-14 NOTE — ED Notes (Signed)
 Pt provided dinner tray, refused to eat at this time

## 2023-12-14 NOTE — ED Notes (Signed)
 Pt changed into hospital maroon scrubs, pt belongings secured in locker, [employer] at bedside.

## 2023-12-14 NOTE — ED Notes (Addendum)
 Patient hollering out in hallway, [employer] tried to talk her down but patient is still talking very loudy in hallway.
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
## 2023-12-14 NOTE — ED Notes (Signed)
 Patient refused ED EKG.

## 2023-12-14 NOTE — ED Notes (Signed)
 Pts belongings, shirt, bra, shorts, phones. Placed in locker number 7
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
## 2023-12-14 NOTE — ED Notes (Addendum)
 Pt has not been to [facility]

## 2023-12-15 DIAGNOSIS — R45851 Suicidal ideations: Secondary | ICD-10-CM

## 2023-12-15 DIAGNOSIS — Z638 Other specified problems related to primary support group: Secondary | ICD-10-CM | POA: Diagnosis not present

## 2023-12-15 MED ORDER — ZIPRASIDONE MESYLATE 20 MG IM SOLR: 20.0000 mg | Freq: Once | INTRAMUSCULAR | Status: DC

## 2023-12-15 MED ORDER — NICOTINE 21 MG/24HR TD PT24
21.0000 mg | MEDICATED_PATCH | Freq: Every day | TRANSDERMAL | Status: DC
  Administered 2023-12-15 – 2023-12-16 (×2): 21 mg via TRANSDERMAL
  Filled 2023-12-15 (×2): qty 1

## 2023-12-15 MED ORDER — LORAZEPAM 2 MG/ML IJ SOLN
2.0000 mg | Freq: Four times a day (QID) | INTRAMUSCULAR | Status: DC | PRN
  Administered 2023-12-15: 2 mg via INTRAMUSCULAR
  Filled 2023-12-15: qty 1

## 2023-12-15 NOTE — ED Notes (Signed)
 Pt seems to be manic and just screaming and cussing the same things over and over again.

## 2023-12-15 NOTE — ED Notes (Signed)
 Pt still upset pt said we are lying ass fucking bitches, we lie like cheap carpet. I am not staying here another night I will walk the fuck out of here in the middle of the night.

## 2023-12-15 NOTE — ED Notes (Signed)
 Pt is now using the phone.
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
## 2023-12-15 NOTE — ED Notes (Signed)
 Attempted to get EKG that was ordered yesterday. Patient refused.

## 2023-12-15 NOTE — ED Notes (Signed)
 Pt said she did not want the dinner tray and to take it out of the room this sitter asked pt if she was sure 2 times and pt said she did not want it, this sitter threw tray away. Pt still yelling and cussing at this time.

## 2023-12-15 NOTE — ED Notes (Signed)
 Pt is still yelling and cussing about everything, pt said 'I am going to keep voicing my fucking opinion as loud as I want to I have a right to an opinion.

## 2023-12-15 NOTE — ED Notes (Addendum)
 Pt got upset and slung TTS cart out of room because she got upset with the questions, pt has been screaming, yelling and cussing ever since.

## 2023-12-15 NOTE — ED Notes (Signed)
 Pt taking a shower

## 2023-12-15 NOTE — ED Notes (Signed)
 Spoke with patient and explained that I understood why she is upset with being in the hospital. Pt. Very calmly explained to me that she did not want to hurt anyone or herself, nor did she ever. Pt. Has not attempted to hurt anyone in the hospital. Also explained to patient that she cannot yell in here. She understood and apologized for being loud. She is laying down in her bed calm and cooperative. Will not give geodon  at this time.

## 2023-12-15 NOTE — ED Notes (Signed)
 EDP and Psychologist, forensic at bedside with patient trying to explain the process of IVC and patient being transferred to [facility]. Pt continues to scream at the doctor and officer that she is not crazy and is not going to a mental hospital.

## 2023-12-15 NOTE — Progress Notes (Signed)
 LCSW Progress Note  [id number]   Samantha [name]  12/15/2023  9:58 AM  Description:   Inpatient Psychiatric Referral  Patient was recommended inpatient per  Elveria Batter NP There are no available beds at [facility], per [facility] Cherylynn Ernst RN. Patient was referred to the following out of network facilities:    [facility] Provider Address Phone Fax  [facility]  [address] [place] [address] [phone]  [facility]  [address] [place] [address] [phone]  [facility]  [address] [place], [place] [address] [phone]  [facility]  [address] [place] [address] [phone]  [facility]  [address] [place] [address] [phone]  [facility]  [address], [place] [address] [phone]  [facility] EFAX  [address] [place], [place] [phone]  [facility]  [address] [place] [address] [phone]        Situation ongoing, CSW to continue following and update chart as more information becomes available.      [place] Albertha Beattie MSW, LCSW  12/15/2023 9:58 AM

## 2023-12-15 NOTE — ED Provider Notes (Signed)
 Emergency Medicine Observation Re-evaluation Note  Samantha [name] is a 48 y.o. female, seen on rounds today.  Pt initially presented to the ED for complaints of Medical Clearance Currently, the patient is the patient is asleep.  Patient was seen in the ER yesterday and had made some suicide gestures.  There is conflicting stories between the patient and family, who had called 911.  Psychiatry team getting collateral history.  Physical Exam  BP (!) 130/94 (BP Location: Right Arm)   Pulse (!) 111   Temp 98.2 F (36.8 C) (Oral)   Resp 20   Ht 5' 1 (1.549 m)   Wt 103 kg   SpO2 100%   BMI 42.91 kg/m  Physical Exam General: No acute distress Cardiac: Regular rate Lungs: No respiratory distress Psych: Currently calm  ED Course / MDM  EKG:   I have reviewed the labs performed to date as well as medications administered while in observation.  Recent changes in the last 24 hours include -patient required Geodon  yesterday evening.  Plan  Current plan is for holding patient for inpatient psychiatric care.  Psychiatry team has decided to admit the patient for stabilization.    Charlyn Sora, MD 12/15/23 [phone]

## 2023-12-15 NOTE — Progress Notes (Signed)
 Pt has been accepted to [facility] TOMORROW 12/16/2023  Bed assignment: Main campus  Pt meets inpatient criteria per: Elveria Batter NP  Attending Physician will be Millie Manners, MD  Report can be called to: [phone] (this is a pager, please leave call-back number when giving report)  Pt can arrive after 8 AM  Care Team Notified: Richerd Buddle RN  [place] Shadiyah Wernli LCSW-A   12/15/2023 10:34 AM

## 2023-12-15 NOTE — Progress Notes (Addendum)
 Pt has been accepted to [facility] on 12/15/2023 . Bed assignment: [facility]  Pt meets inpatient criteria per Elveria Batter, NP   Attending Physician will be Dr. Jess  Report can be called to: [phone]  Pt can arrive after 8AM  Care Team Notified: Diamond Griffin, RN

## 2023-12-15 NOTE — ED Notes (Signed)
Pt. Is sleeping.

## 2023-12-15 NOTE — Consult Note (Signed)
 [facility] Psychiatric Consult Initial  Patient Name: .Samantha [name]  MRN: [id number]  DOB: 11/14/1975  Consult Order details:  Orders (From admission, onward)     Start     Ordered   12/14/23 1509  CONSULT TO CALL ACT TEAM       Ordering Provider: Charlyn Sora, MD  Provider:  (Not yet assigned)  Question:  Reason for Consult?  Answer:  Psych consult   12/14/23 1509             Mode of Visit: Tele-visit Location of [facility]  [facility] of [facility]    Psychiatry Consult Evaluation  Service Date: December 15, 2023 LOS:  LOS: 0 days  Chief Complaint I do not need to be here I need to go.  Primary Psychiatric Diagnoses  Suicidal risk 2.  Family discord   Assessment  Samantha [name] is a 48 y.o. female admitted: Presented to the EDfor 12/14/2023  2:22 PM for cutting and suicidal ideation. She she self-reports a past psychiatric history of bipolar depression, anxiety, PTSD, and insomnia and has a past medical  history significant for seizures, arrhythmia, celiac disease, chronic pain, DDD (degenerative disc disease), interstitial cystitis, kidney stones, SVT (supraventricular tachycardia).  Patient placed under involuntary commitment by emergency room physician IVC findings were as follows, miss Dufresne was brought to the emergency room by police.  Police had responded to 911 call for disturbance.  Allegedly, patient was trying to cut herself and had threatened in her life.  This threat was heard by patient's daughter, who called 911.  Patient indicates that the cutting type injury is not from patient cutting herself but from her cleaning up.  She indicates that she told her daughter to give her 20 minutes, and then she was going to leave the house.  In the ER Ms. Lamarr is agitated and frustrated.   Currently patient is denying any depression, anxiety, or suicidal ideations.  She denies all accusations made in the IVC.   At this time her presentation is most consistent with family discord.  However currently attempting to obtain collateral and have been unsuccessful.  Disposition is currently pending collateral.  Current outpatient psychotropic medications include BuSpar, Keppra , Xanax , Percocet, Seroquel, and possibly Pristiq and historically she has had a good response to these medications. She reports compliance with medications prior to admission. Please see plan below for detailed recommendations.   Diagnoses:  Active Hospital problems: Principal Problem:   Depression Active Problems:   Suicidal risk   Family discord    Plan   ## Psychiatric Medication Recommendations:  Pharmacy to verify patient's home medicines-patient was unsure of dosages of medications but states she takes BuSpar, Keppra , Xanax , Percocet, and Seroquel.  She takes 1 other psychotropic medication but is unsure of the name of it it may be Pristiq.   ## Medical Decision Making Capacity: Not specifically addressed in this encounter  ## Further Work-up:  -- Recommend EKG-not on file -- Pertinent labwork reviewed earlier this admission includes: UDS positive for benzodiazepines and THC, ethanol level less than 15,  C MP glucose 118 and CBC WNL.   ## Disposition:--Recommend Inpatient psychiatric admission. Patient IVC on 12/14/2023. Patient [facility] to [facility] on 12/16/2023.  ## Behavioral / Environmental: - No specific recommendations at this time.  Seizure precaution as patient states she had a seizure 2 days ago    ## Safety and Observation Level:  - Based on my clinical  evaluation, I estimate the patient to be at low risk of self harm in the current setting. - At this time, we recommend  routine. This decision is based on my review of the chart including patient's history and current presentation, interview of the patient, mental status examination, and consideration of suicide risk including evaluating suicidal  ideation, plan, intent, suicidal or self-harm behaviors, risk factors, and protective factors. This judgment is based on our ability to directly address suicide risk, implement suicide prevention strategies, and develop a safety plan while the patient is in the clinical setting. Please contact our team if there is a concern that risk level has changed.  CSSR Risk Category:C-SSRS RISK CATEGORY: Moderate Risk  Suicide Risk Assessment: Patient has following modifiable risk factors for suicide: current symptoms: anxiety/panic, insomnia, impulsivity, anhedonia, hopelessness, triggering events, and pain, medical illness (ie new dx of cancer), chronic pain which we are addressing by -will start home medications once they have been verified by pharmacy.  Actively trying to reach patient's spouse/boyfriend for collateral.  Patient will remain in the emergency department while awaiting this collateral. Patient has following non-modifiable or demographic risk factors for suicide: history of suicide attempt, history of self harm behavior, and psychiatric hospitalization Patient has the following protective factors against suicide: Access to outpatient mental health care, Pets in the home, and Frustration tolerance  Thank you for this consult request. Recommendations have been communicated to the primary team.  Patient recommended to an inpatient psychiatric hospital.   CATHALEEN ADAM, PMHNP       History of Present Illness  Relevant Aspects of Hospital ED Course:  Admitted on 12/14/2023 : Presented to the EDfor 12/14/2023  2:22 PM for cutting and suicidal ideation. She she self-reports a past psychiatric history of bipolar depression, anxiety, PTSD, and insomnia and has a past medical  history significant for seizures, arrhythmia, celiac disease, chronic pain, DDD (degenerative disc disease), interstitial cystitis, kidney stones, SVT (supraventricular tachycardia).  Patient Report:  Patient is sitting on  the side of her bed, she is speaking with this provider.  As this provider is attempting to inform her that she has been recommended to an inpatient psychiatric facility, patient begins to get upset and hollers I do not need to be here I need to go.  She states you are listening to my daughter who is 53 years old but has a mind of her 48 year old, and her husband is also slow, I need to go.This provider not able to get a word in, as patient is yelling and screaming that she does not need to be admitted to an inpatient psychiatric facility.  When this provider confronts her attempting to lock herself in her bathroom with the knife, patient denies it, denies ever having any psychiatric issues, denies any depression or anxiety.  She continues to state that she and patient fell and broke ashtrays. During her assessment, she played the victim role and was quite dramatic at times not taking any responsibility for her actions. She is oriented and alert. Her judgment, insight and impulse control are impaired. Her thoughts are mildly disorganized at times. Her memory appears to be intact. She does not appear to be responding to any internal stimuli. Her speech is coherent and increased in tone and rate and her eye contact is fair.  When she does not feel like listening to the provider on the tele screen, she aggressively pushes the tele cart towards her room door, and states she does not want to hear  anymore, and tells her nurse tech she is done talking.   Psych ROS:  Depression: Denies any depression Anxiety: Denies any anxiety but appears visibly anxious Mania (lifetime and current): Denies Psychosis: (lifetime and current): Denies  Collateral information:  Psychiatric provider Elveria, spoke with patient's boyfriend... See notes  Review of Systems  Respiratory:  Negative for cough.   Musculoskeletal: Negative.   Neurological:  Positive for seizures (last seizure 2 days ago). Negative for tremors.   Psychiatric/Behavioral:  The patient is nervous/anxious and has insomnia.      Psychiatric and Social History  Psychiatric History:  Information collected from chart review and patient  Prev Dx/Sx: Reports a past psychiatric history of bipolar depression, insomnia, PTSD, trauma and anxiety Current Psych Provider: Sees Dr. Claudene at [facility] in [facility] Meds (current): Reports she takes BuSpar, Keppra , Xanax , Percocet, Seroquel, and possibly Pristiq-patient unsure of dosages Previous Med Trials: See above Therapy: No therapy in place  Prior Psych Hospitalization: Reports 1 psychiatric admission at [facility] in 2014 due to suicide attempt via cutting wrist Prior Self Harm: Denies any current but has history of Prior Violence: Denies  Family Psych History: Denies Family Hx suicide: Denies  Social History:  Developmental Hx: Denies Educational Hx: Completed 12th grade with 2 years of college Occupational Hx: Currently unemployed and disabled due to seizures Legal Hx: Denies Living Situation: Lives in a home with her spouse/boyfriend, daughter and her husband, and son Spiritual Hx: Denies any religious beliefs Access to weapons/lethal means: Denies  Substance History Alcohol : Denies Tobacco: Endorses half pack-1 pack/day Illicit drugs: Had used over-the-counter CBD but gets drug tested due to Percocet and recently stopped using it Prescription drug abuse: Denies Rehab hx: Denies  Exam Findings  Physical Exam:  Vital Signs:    Blood pressure (!) 130/94, pulse (!) 111, temperature 98.2 F (36.8 C), temperature source Oral, resp. rate 20, height 5' 1 (1.549 m), weight 103 kg, SpO2 100%. Body mass index is 42.91 kg/m.  Physical Exam Constitutional:      Appearance: Normal appearance.  Pulmonary:     Effort: Pulmonary effort is normal. No respiratory distress.  Musculoskeletal:        General: Normal range of motion.  Neurological:     Mental Status:  She is alert and oriented to person, place, and time.  Psychiatric:        Attention and Perception: Attention and perception normal.        Mood and Affect: Mood is anxious.        Speech: Speech normal.        Behavior: Behavior is agitated. Behavior is cooperative.        Thought Content: Thought content normal.        Cognition and Memory: Cognition normal.        Judgment: Judgment normal.     Mental Status Exam: General Appearance: Casual  Orientation:  Full (Time, Place, and Person)  Memory:  Immediate;   Good Recent;   Good Remote;   Good  Concentration:  Concentration: Good and Attention Span: Good  Recall:  Good  Attention  Good  Eye Contact:  Good  Speech:  Clear and Coherent and Pressured at times when she talks about situation that occurred at home before presentation to the ED  Language:  Good  Volume:  Increased at x 1 discussing situation occurred before presenting to the ED  Mood: I am fine  Affect:  Agitated and anxious  Thought Process:  Coherent  Thought Content:  Logical  Suicidal Thoughts:  No  Homicidal Thoughts:  No  Judgement:  Fair  Insight:  Good  Psychomotor Activity:  Restlessness  Akathisia:  No  Fund of Knowledge:  Good      Assets:  Communication Skills Desire for Improvement Financial Resources/Insurance Housing Intimacy Leisure Time Physical Health Resilience Social Support  Cognition:  WNL  ADL's:  Intact  AIMS (if indicated):        Other History   These have been pulled in through the EMR, reviewed, and updated if appropriate.  Family History:  The patient's family history includes Cancer in her mother; Heart disease in her mother.  Medical History: Past Medical History:  Diagnosis Date   Anxiety disorder    Arrhythmia    Celiac disease    Chronic pain    DDD (degenerative disc disease)    Degenerative disc disease, lumbar    Depression    Interstitial cystitis    Kidney stones    Panic disorder    PTSD  (post-traumatic stress disorder)    Suicidal risk    SVT (supraventricular tachycardia) (HCC)     Surgical History: Past Surgical History:  Procedure Laterality Date   ABDOMINAL HYSTERECTOMY     ADENOIDECTOMY     APPENDECTOMY     CESAREAN SECTION     HERNIA REPAIR     KIDNEY STONE SURGERY     TONSILLECTOMY       Medications:   Current Facility-Administered Medications:    acetaminophen  (TYLENOL ) tablet 650 mg, 650 mg, Oral, Q4H PRN, Nanavati, Ankit, MD   ALPRAZolam  (XANAX ) tablet 0.5 mg, 0.5 mg, Oral, BID PRN, Patsey Lot, MD   levETIRAcetam  (KEPPRA ) tablet 500 mg, 500 mg, Oral, BID, Patsey Lot, MD, 500 mg at 12/15/23 1429   levothyroxine  (SYNTHROID ) tablet 125 mcg, 125 mcg, Oral, QAC breakfast, Patsey Lot, MD, 125 mcg at 12/15/23 1429   LORazepam  (ATIVAN ) injection 2 mg, 2 mg, Intramuscular, Q6H PRN, Motley-Mangrum, Kasson Lamere A, PMHNP, 2 mg at 12/15/23 1548   ondansetron  (ZOFRAN ) tablet 4 mg, 4 mg, Oral, Q8H PRN, Nanavati, Ankit, MD   ziprasidone  (GEODON ) injection 20 mg, 20 mg, Intramuscular, Once, Dean Clarity, MD   zolpidem  (AMBIEN ) tablet 5 mg, 5 mg, Oral, QHS PRN, Nanavati, Ankit, MD  Current Outpatient Medications:    acetaminophen  (TYLENOL ) 500 MG tablet, Take 1,000 mg by mouth every 6 (six) hours as needed for mild pain (pain score 1-3)., Disp: , Rfl:    ALPRAZolam  (XANAX ) 0.5 MG tablet, Take 0.5 mg by mouth 2 (two) times daily as needed for anxiety., Disp: , Rfl:    levothyroxine  (SYNTHROID ) 125 MCG tablet, Take 125 mcg by mouth daily before breakfast., Disp: , Rfl:    naloxone  (NARCAN ) nasal spray 4 mg/0.1 mL, Place 1 spray into the nose once as needed (OD)., Disp: , Rfl:    oxyCODONE -acetaminophen  (PERCOCET) 10-325 MG tablet, Take 1 tablet by mouth 3 (three) times daily., Disp: , Rfl:    QUEtiapine (SEROQUEL) 100 MG tablet, Take 100 mg by mouth at bedtime., Disp: , Rfl:    levETIRAcetam  (KEPPRA ) 500 MG tablet, Take 500 mg by mouth 2 (two) times  daily., Disp: , Rfl:   Allergies: Allergies  Allergen Reactions   Gluten Other (See Comments)    Celiac disease Patient has condition that eliminates any Gluten intake   Tramadol  Hives and Nausea And Vomiting   Naproxen Other (See Comments)    Abd pain  Melana Hingle MOTLEY-MANGRUM, PMHNP

## 2023-12-15 NOTE — ED Notes (Signed)
Pt using the phone again.

## 2023-12-15 NOTE — ED Notes (Signed)
 Patient excepted into [facility] for today. The nurse attempted to give report to [facility] but [facility] refuses to except the patient until the patient takes her PO medications. Pt refuses to take all PO meds at this time. This nurse explained to the patient that for her to go to [facility] for tx and then get to go back home she needs to be compliant in taking her medications. The patient became irate, screaming and yelling at staff that she is not crazy and not going to a mental hospital. EDP made aware.

## 2023-12-15 NOTE — ED Notes (Signed)
 Pt is being TTS again now.

## 2023-12-15 NOTE — Progress Notes (Signed)
 Inpatient Psychiatric Referral  Patient was recommended inpatient per  Elveria Batter, NP . There are no available beds at [facility], per [facility] Luke Sprang, RN. Patient was referred to the following out of network facilities:  Destination  Service Provider Request Status Address Phone Fax  [facility]  Pending - Request Sent [address], [place] [address] [phone]  [facility]  Pending - Request Sent [address] [place] [address] [phone]  [facility]  Pending - Request Sent [address] [place] [address] [phone]  [facility]  Pending - Request Sent [address] [place] [address] [phone]  [facility]  Pending - Request Sent [address] [place] [address] [phone]  [facility]  Pending - Request Sent [address] [place] [address] [phone]  [facility]  Pending - Request Sent [address] [place] [phone]  [facility]  Pending - Request Sent [address], [place] [address] [phone]    Situation ongoing, CSW to continue following and update chart as more information becomes available.  Harrie Sofia MSW, LCSWA 12/15/2023  7:41PM

## 2023-12-16 MED ORDER — HALOPERIDOL LACTATE 5 MG/ML IJ SOLN: 5.0000 mg | Freq: Three times a day (TID) | INTRAMUSCULAR | Status: DC | PRN

## 2023-12-16 NOTE — ED Notes (Signed)
 Attempted to call report to [facility] x 3 at number provided by Harrie Sofia, LCSW as well as main number listed-no answer.

## 2023-12-16 NOTE — ED Notes (Signed)
 Called from my personal cell to give report on pt going to [facility] as it seemed the calls never made it through from the hospital lines. Gentleman put me on hold for receiving nurse but came back and took my info for her to call me back as she was busy with a patient at the time.

## 2023-12-16 NOTE — ED Notes (Signed)
 Pt walked to and from with no problem

## 2023-12-17 DIAGNOSIS — F209 Schizophrenia, unspecified: Secondary | ICD-10-CM | POA: Diagnosis not present

## 2023-12-18 DIAGNOSIS — F209 Schizophrenia, unspecified: Secondary | ICD-10-CM | POA: Diagnosis not present

## 2023-12-19 DIAGNOSIS — F209 Schizophrenia, unspecified: Secondary | ICD-10-CM | POA: Diagnosis not present

## 2023-12-20 DIAGNOSIS — F209 Schizophrenia, unspecified: Secondary | ICD-10-CM | POA: Diagnosis not present

## 2023-12-21 DIAGNOSIS — F209 Schizophrenia, unspecified: Secondary | ICD-10-CM | POA: Diagnosis not present

## 2023-12-22 DIAGNOSIS — F209 Schizophrenia, unspecified: Secondary | ICD-10-CM | POA: Diagnosis not present

## 2023-12-23 DIAGNOSIS — Z79899 Other long term (current) drug therapy: Secondary | ICD-10-CM | POA: Diagnosis not present

## 2023-12-23 DIAGNOSIS — Z6841 Body Mass Index (BMI) 40.0 and over, adult: Secondary | ICD-10-CM | POA: Diagnosis not present

## 2023-12-23 DIAGNOSIS — F3130 Bipolar disorder, current episode depressed, mild or moderate severity, unspecified: Secondary | ICD-10-CM | POA: Diagnosis not present

## 2023-12-25 DIAGNOSIS — Z79899 Other long term (current) drug therapy: Secondary | ICD-10-CM | POA: Diagnosis not present

## 2023-12-29 DIAGNOSIS — E559 Vitamin D deficiency, unspecified: Secondary | ICD-10-CM | POA: Diagnosis not present

## 2023-12-29 DIAGNOSIS — F17210 Nicotine dependence, cigarettes, uncomplicated: Secondary | ICD-10-CM | POA: Diagnosis not present

## 2023-12-29 DIAGNOSIS — M5030 Other cervical disc degeneration, unspecified cervical region: Secondary | ICD-10-CM | POA: Diagnosis not present

## 2023-12-29 DIAGNOSIS — F419 Anxiety disorder, unspecified: Secondary | ICD-10-CM | POA: Diagnosis not present

## 2023-12-29 DIAGNOSIS — R7303 Prediabetes: Secondary | ICD-10-CM | POA: Diagnosis not present

## 2023-12-29 DIAGNOSIS — Z91148 Patient's other noncompliance with medication regimen for other reason: Secondary | ICD-10-CM | POA: Diagnosis not present

## 2023-12-29 DIAGNOSIS — Z79899 Other long term (current) drug therapy: Secondary | ICD-10-CM | POA: Diagnosis not present

## 2023-12-31 DIAGNOSIS — Z79899 Other long term (current) drug therapy: Secondary | ICD-10-CM | POA: Diagnosis not present

## 2024-01-21 DIAGNOSIS — F419 Anxiety disorder, unspecified: Secondary | ICD-10-CM | POA: Diagnosis not present

## 2024-01-21 DIAGNOSIS — M5030 Other cervical disc degeneration, unspecified cervical region: Secondary | ICD-10-CM | POA: Diagnosis not present

## 2024-01-21 DIAGNOSIS — F17210 Nicotine dependence, cigarettes, uncomplicated: Secondary | ICD-10-CM | POA: Diagnosis not present

## 2024-01-21 DIAGNOSIS — R7303 Prediabetes: Secondary | ICD-10-CM | POA: Diagnosis not present

## 2024-01-21 DIAGNOSIS — Z91148 Patient's other noncompliance with medication regimen for other reason: Secondary | ICD-10-CM | POA: Diagnosis not present

## 2024-01-21 DIAGNOSIS — E559 Vitamin D deficiency, unspecified: Secondary | ICD-10-CM | POA: Diagnosis not present

## 2024-01-21 DIAGNOSIS — Z79899 Other long term (current) drug therapy: Secondary | ICD-10-CM | POA: Diagnosis not present

## 2024-01-26 DIAGNOSIS — Z79899 Other long term (current) drug therapy: Secondary | ICD-10-CM | POA: Diagnosis not present
